# Patient Record
Sex: Female | Born: 1960 | Race: White | Hispanic: No | Smoking: Current every day smoker
Health system: Southern US, Community
[De-identification: ages and names within clinical notes are randomized; demographics above are authoritative.]

## PROBLEM LIST (undated history)

## (undated) DIAGNOSIS — R51 Headache: Secondary | ICD-10-CM

## (undated) DIAGNOSIS — T7840XA Allergy, unspecified, initial encounter: Secondary | ICD-10-CM

## (undated) DIAGNOSIS — J449 Chronic obstructive pulmonary disease, unspecified: Secondary | ICD-10-CM

## (undated) DIAGNOSIS — F32A Depression, unspecified: Secondary | ICD-10-CM

## (undated) DIAGNOSIS — R519 Headache, unspecified: Secondary | ICD-10-CM

## (undated) DIAGNOSIS — F329 Major depressive disorder, single episode, unspecified: Secondary | ICD-10-CM

## (undated) HISTORY — DX: Headache: R51

## (undated) HISTORY — DX: Chronic obstructive pulmonary disease, unspecified: J44.9

## (undated) HISTORY — DX: Allergy, unspecified, initial encounter: T78.40XA

## (undated) HISTORY — DX: Depression, unspecified: F32.A

## (undated) HISTORY — DX: Headache, unspecified: R51.9

## (undated) HISTORY — DX: Major depressive disorder, single episode, unspecified: F32.9

## (undated) HISTORY — PX: CARPAL TUNNEL RELEASE: SHX101

---

## 1998-12-19 ENCOUNTER — Other Ambulatory Visit: Admission: RE | Admit: 1998-12-19 | Discharge: 1998-12-19 | Payer: Self-pay | Admitting: Family Medicine

## 1999-05-01 ENCOUNTER — Other Ambulatory Visit: Admission: RE | Admit: 1999-05-01 | Discharge: 1999-05-01 | Payer: Self-pay | Admitting: Gynecology

## 2002-02-02 ENCOUNTER — Encounter: Payer: Self-pay | Admitting: Emergency Medicine

## 2002-02-02 ENCOUNTER — Emergency Department (HOSPITAL_COMMUNITY): Admission: EM | Admit: 2002-02-02 | Discharge: 2002-02-02 | Payer: Self-pay | Admitting: Emergency Medicine

## 2002-02-04 ENCOUNTER — Emergency Department (HOSPITAL_COMMUNITY): Admission: EM | Admit: 2002-02-04 | Discharge: 2002-02-04 | Payer: Self-pay | Admitting: Emergency Medicine

## 2009-12-12 ENCOUNTER — Ambulatory Visit: Payer: Self-pay | Admitting: Family Medicine

## 2009-12-12 DIAGNOSIS — J019 Acute sinusitis, unspecified: Secondary | ICD-10-CM | POA: Insufficient documentation

## 2009-12-12 HISTORY — DX: Acute sinusitis, unspecified: J01.90

## 2010-02-14 ENCOUNTER — Ambulatory Visit: Payer: Self-pay | Admitting: Family Medicine

## 2010-02-14 DIAGNOSIS — M25539 Pain in unspecified wrist: Secondary | ICD-10-CM | POA: Insufficient documentation

## 2010-02-14 DIAGNOSIS — R300 Dysuria: Secondary | ICD-10-CM

## 2010-02-14 HISTORY — DX: Pain in unspecified wrist: M25.539

## 2010-02-14 HISTORY — DX: Dysuria: R30.0

## 2010-02-14 LAB — CONVERTED CEMR LAB
Bilirubin Urine: NEGATIVE
Blood in Urine, dipstick: NEGATIVE
Glucose, Urine, Semiquant: NEGATIVE
Ketones, urine, test strip: NEGATIVE
Nitrite: NEGATIVE
Specific Gravity, Urine: 1.015
Urobilinogen, UA: 0.2
WBC Urine, dipstick: NEGATIVE
pH: 6.5

## 2010-02-16 ENCOUNTER — Telehealth (INDEPENDENT_AMBULATORY_CARE_PROVIDER_SITE_OTHER): Payer: Self-pay | Admitting: *Deleted

## 2010-03-27 ENCOUNTER — Encounter: Payer: Self-pay | Admitting: Family Medicine

## 2010-10-10 NOTE — Progress Notes (Signed)
Summary: Lab Results   Phone Note Outgoing Call   Call placed by: Army Fossa CMA,  February 16, 2010 3:39 PM Reason for Call: Discuss lab or test results Summary of Call: Regarding lab results, LMTCB:  contaminated--- if symptoms persist after cipro---recheck ua,  C&S Signed by Loreen Freud DO on 02/16/2010 at 9:24 AM  Follow-up for Phone Call        [facility]. Army Fossa CMA  February 17, 2010 10:31 AM   Additional Follow-up for Phone Call Additional follow up Details #1::        Pt is aware. Army Fossa CMA  February 20, 2010 10:46 AM

## 2010-10-10 NOTE — Consult Note (Signed)
Summary: [facility]  [facility]   Imported By: Lanelle Bal 04/07/2010 11:00:58  _____________________________________________________________________  External Attachment:    Type:   Image     Comment:   External Document

## 2010-10-10 NOTE — Assessment & Plan Note (Signed)
Summary: NEW "ACUTE ONLY" ? SINUS INFECTION,[facility] INS/RH......   Vital Signs:  Patient profile:   50 year old female Height:      64 inches Weight:      168 pounds BMI:     28.94 Temp:     98.8 degrees F oral Pulse rate:   83 / minute Pulse rhythm:   regular BP sitting:   122 / 80  (left arm) Cuff size:   regular  Vitals Entered By: Army Fossa CMA (December 12, 2009 2:32 PM) CC: Pt here for possible sinus infection, pressure in her face, sneezing., URI symptoms   History of Present Illness:       This is a 50 year old woman who presents with URI symptoms.  The symptoms began 1 week ago.  The patient complains of nasal congestion, purulent nasal discharge, sore throat, productive cough, earache, and sick contacts.  The patient denies fever, low-grade fever (<100.5 degrees), fever of 100.5-103 degrees, fever of 103.1-104 degrees, fever to >104 degrees, stiff neck, dyspnea, wheezing, rash, vomiting, diarrhea, use of an antipyretic, and response to antipyretic.  The patient also reports headache.  The patient denies itchy watery eyes, itchy throat, sneezing, seasonal symptoms, response to antihistamine, muscle aches, and severe fatigue.  The patient denies the following risk factors for Strep sinusitis: unilateral facial pain, unilateral nasal discharge, poor response to decongestant, double sickening, tooth pain, Strep exposure, tender adenopathy, and absence of cough.  + B/L sinus pressure.  No otc meds.     Preventive Screening-Counseling & Management  Alcohol-Tobacco     Smoking Status: current  Caffeine-Diet-Exercise     Does Patient Exercise: no      Drug Use:  no.    Current Medications (verified): 1)  Augmentin 875-125 Mg Tabs (Amoxicillin-Pot Clavulanate) .Marland Kitchen.. 1 By Mouth Two Times A Day 2)  Nasonex 50 Mcg/act Susp (Mometasone Furoate) .... 2 Sprays Each Nostril Once Daily  Allergies (verified): 1)  ! Ibuprofen  Past History:  Family History: Last updated:  12/12/2009 Family History Diabetes 1st degree relative Family History High cholesterol Family History Hypertension Lupus  Social History: Last updated: 12/12/2009 Married Current Smoker Alcohol use-yes Drug use-no Regular exercise-no  Risk Factors: Exercise: no (12/12/2009)  Risk Factors: Smoking Status: current (12/12/2009)  Past medical, surgical, family and social histories (including risk factors) reviewed for relevance to current acute and chronic problems.  Past Surgical History: Caesarean section  Family History: Reviewed history and no changes required. Family History Diabetes 1st degree relative Family History High cholesterol Family History Hypertension Lupus  Social History: Reviewed history and no changes required. Married Current Smoker Alcohol use-yes Drug use-no Regular exercise-no Smoking Status:  current Drug Use:  no Does Patient Exercise:  no  Review of Systems      See HPI  Physical Exam  General:  Well-developed,well-nourished,in no acute distress; alert,appropriate and cooperative throughout examination Nose:  L frontal sinus tenderness, L maxillary sinus tenderness, R frontal sinus tenderness, and R maxillary sinus tenderness.   Mouth:  Oral mucosa and oropharynx without lesions or exudates.  Teeth in good repair. Neck:  No deformities, masses, or tenderness noted. Lungs:  Normal respiratory effort, chest expands symmetrically. Lungs are clear to auscultation, no crackles or wheezes. Heart:  normal rate and no murmur.   Psych:  Oriented X3 and normally interactive.     Impression & Recommendations:  Problem # 1:  SINUSITIS - ACUTE-NOS (ICD-461.9)  Her updated medication list for this problem includes:  Augmentin 875-125 Mg Tabs (Amoxicillin-pot clavulanate) .Marland Kitchen... 1 by mouth two times a day    Nasonex 50 Mcg/act Susp (Mometasone furoate) .Marland Kitchen... 2 sprays each nostril once daily  Instructed on treatment. Call if symptoms persist or  worsen.   Complete Medication List: 1)  Augmentin 875-125 Mg Tabs (Amoxicillin-pot clavulanate) .Marland Kitchen.. 1 by mouth two times a day 2)  Nasonex 50 Mcg/act Susp (Mometasone furoate) .... 2 sprays each nostril once daily Prescriptions: AUGMENTIN 875-125 MG TABS (AMOXICILLIN-POT CLAVULANATE) 1 by mouth two times a day  #20 x 0   Entered and Authorized by:   Loreen Freud DO   Signed by:   Loreen Freud DO on 12/12/2009   Method used:   Electronically to        [facility] (retail)       [address]       [place], [place]  [address]       Ph: [phone]       Fax: [phone]   RxID:   [id number]

## 2010-10-10 NOTE — Assessment & Plan Note (Signed)
Summary: carpal tunnel??//lch   Vital Signs:  Patient profile:   50 year old female Weight:      168 pounds Pulse rate:   70 / minute Pulse rhythm:   regular BP sitting:   126 / 82  (left arm) Cuff size:   regular  Vitals Entered By: Army Fossa CMA (February 14, 2010 10:51 AM) CC: Pt here c/o possibe carpal tunnel in her hands, c/o lower back also., Dysuria   History of Present Illness: Pt here c/o b/l wrist pain ---for years but recently worse--she has pain and numbness in both hands to elbow--it bothers her all day and night.     Dysuria      This is a 50 year old woman who presents with Dysuria.  The symptoms began 2 days ago.  The patient complains of burning with urination and urinary frequency, but denies urgency, hematuria, vaginal discharge, vaginal itching, and vaginal sores.  Associated symptoms include back pain.  The patient denies the following associated symptoms: nausea, vomiting, fever, shaking chills, flank pain, abdominal pain, pelvic pain, and arthralgias.  The patient denies the following risk factors: diabetes, prior antibiotics, immunosuppression, history of GU anomaly, history of pyelonephritis, pregnancy, history of STD, and analgesic abuse.    Current Medications (verified): 1)  Cipro 500 Mg Tabs (Ciprofloxacin Hcl) .Marland Kitchen.. 1 By Mouth Two Times A Day  Allergies: 1)  ! Ibuprofen  Past History:  Past medical, surgical, family and social histories (including risk factors) reviewed for relevance to current acute and chronic problems.  Past Surgical History: Reviewed history from 12/12/2009 and no changes required. Caesarean section  Family History: Reviewed history from 12/12/2009 and no changes required. Family History Diabetes 1st degree relative Family History High cholesterol Family History Hypertension Lupus  Social History: Reviewed history from 12/12/2009 and no changes required. Married Current Smoker Alcohol use-yes Drug use-no Regular  exercise-no  Review of Systems      See HPI  Physical Exam  General:  Well-developed,well-nourished,in no acute distress; alert,appropriate and cooperative throughout examination Abdomen:  Bowel sounds positive,abdomen soft and non-tender without masses, organomegaly or hernias noted. Pulses:  L radial normal.   Neurologic:  + numbness an tinling b/l wrists and hands with flexion of wrists and at rest--no relief Skin:  Intact without suspicious lesions or rashes Psych:  Oriented X3 and normally interactive.     Impression & Recommendations:  Problem # 1:  WRIST PAIN, BILATERAL (ICD-719.43)  Orders: Ankle / Wrist Splint (A4570) Ankle / Wrist Splint (A4570) UA Dipstick w/o Micro (manual) (40981) Orthopedic Surgeon Referral (Ortho Surgeon)  Problem # 2:  DYSURIA (ICD-788.1)  Orders: T-Culture, Urine (19147-82956) UA Dipstick w/o Micro (manual) (21308)  Encouraged to push clear liquids, get enough rest, and take acetaminophen as needed. To be seen in 10 days if no improvement, sooner if worse.  Her updated medication list for this problem includes:    Cipro 500 Mg Tabs (Ciprofloxacin hcl) .Marland Kitchen... 1 by mouth two times a day  Complete Medication List: 1)  Cipro 500 Mg Tabs (Ciprofloxacin hcl) .Marland Kitchen.. 1 by mouth two times a day Prescriptions: CIPRO 500 MG TABS (CIPROFLOXACIN HCL) 1 by mouth two times a day  #10 x 0   Entered and Authorized by:   Loreen Freud DO   Signed by:   Loreen Freud DO on 02/14/2010   Method used:   Electronically to        [facility] (retail)  [address]       [place], [place]  [address]       Ph: [phone]       Fax: [phone]   RxID:   [id number]   Laboratory Results   Urine Tests    Routine Urinalysis   Color: yellow Appearance: Clear Glucose: negative   (Normal Range: Negative) Bilirubin: negative   (Normal Range: Negative) Ketone: negative   (Normal Range: Negative) Spec. Gravity: 1.015    (Normal Range: 1.003-1.035) Blood: negative   (Normal Range: Negative) pH: 6.5   (Normal Range: 5.0-8.0) Protein: trace   (Normal Range: Negative) Urobilinogen: 0.2   (Normal Range: 0-1) Nitrite: negative   (Normal Range: Negative) Leukocyte Esterace: negative   (Normal Range: Negative)    Comments: Army Fossa CMA  February 14, 2010 10:56 AM

## 2012-09-19 ENCOUNTER — Ambulatory Visit: Payer: Self-pay | Admitting: Family Medicine

## 2013-07-04 ENCOUNTER — Encounter (HOSPITAL_COMMUNITY): Payer: Self-pay | Admitting: Emergency Medicine

## 2013-07-04 ENCOUNTER — Emergency Department (HOSPITAL_COMMUNITY)
Admission: EM | Admit: 2013-07-04 | Discharge: 2013-07-04 | Disposition: A | Discharge status: Home / Self Care | Payer: Worker's Compensation | Attending: Emergency Medicine | Admitting: Emergency Medicine

## 2013-07-04 DIAGNOSIS — F17200 Nicotine dependence, unspecified, uncomplicated: Secondary | ICD-10-CM | POA: Insufficient documentation

## 2013-07-04 DIAGNOSIS — G8911 Acute pain due to trauma: Secondary | ICD-10-CM | POA: Insufficient documentation

## 2013-07-04 DIAGNOSIS — M62838 Other muscle spasm: Secondary | ICD-10-CM | POA: Insufficient documentation

## 2013-07-04 MED ORDER — HYDROMORPHONE HCL PF 1 MG/ML IJ SOLN
1.0000 mg | Freq: Once | INTRAMUSCULAR | Status: AC
  Administered 2013-07-04: 1 mg via INTRAMUSCULAR
  Filled 2013-07-04: qty 1

## 2013-07-04 MED ORDER — DIAZEPAM 5 MG/ML IJ SOLN
5.0000 mg | Freq: Once | INTRAMUSCULAR | Status: AC
  Administered 2013-07-04: 5 mg via INTRAMUSCULAR
  Filled 2013-07-04: qty 2

## 2013-07-04 MED ORDER — OXYCODONE-ACETAMINOPHEN 5-325 MG PO TABS
2.0000 | ORAL_TABLET | Freq: Once | ORAL | Status: AC
  Administered 2013-07-04: 2 via ORAL
  Filled 2013-07-04: qty 2

## 2013-07-04 MED ORDER — DIAZEPAM 5 MG PO TABS: 5.0000 mg | ORAL_TABLET | Freq: Three times a day (TID) | ORAL | Status: DC | PRN

## 2013-07-04 NOTE — ED Provider Notes (Signed)
CSN: [id number]     Arrival date & time 07/04/13  0141 History   First MD Initiated Contact with Patient 07/04/13 0210     Chief Complaint  Patient presents with  . Neck Pain   (Consider location/radiation/quality/duration/timing/severity/associated sxs/prior Treatment) HPI Comments: States that on Wednesday.  The machine, she was working on continually, jammed it.  She had 2 use her right arm to pull forcefully material out of the gym.  Several times.  She is she felt a little pain in the side of her neck.  At that time, but when she woke Thursday morning.  She was stiff, and having trouble moving her right shoulder, and turning her head.  She was seen by the occupational health doctor, who diagnosed her with a muscle strain and prescribed Flexeril.  She took this for one day without any help hold him and he, then prescribed, Percocet, which she has been taking, as well as the Flexeril, with no relief.  She is also been applying ice for 20 minutes every 4 hours  Patient is a 52 y.o. female presenting with neck pain. The history is provided by the patient.  Neck Pain Pain location:  R side Quality:  Aching and stiffness Pain radiates to:  R scapula Pain severity:  Severe Pain is:  Same all the time Onset quality:  Gradual Duration:  3 days Timing:  Constant Progression:  Worsening Chronicity:  New Context: recent injury   Relieved by:  Nothing Worsened by:  Position Ineffective treatments:  Analgesics, ice and muscle relaxants Associated symptoms: no fever, no headaches, no numbness, no tingling and no weakness   Risk factors: no hx of osteoporosis, no hx of spinal trauma and no recent epidural     History reviewed. No pertinent past medical history. Past Surgical History  Procedure Laterality Date  . Carpal tunnel release     No family history on file. History  Substance Use Topics  . Smoking status: Current Every Day Smoker -- 1.00 packs/day    Types: Cigarettes  .  Smokeless tobacco: Not on file  . Alcohol Use: Yes     Comment: socially   OB History   Grav Para Term Preterm Abortions TAB SAB Ect Mult Living                 Review of Systems  Constitutional: Negative for fever.  Musculoskeletal: Positive for neck pain.  Neurological: Negative for dizziness, tingling, weakness, numbness and headaches.    Allergies  Ibuprofen  Home Medications   Current Outpatient Rx  Name  Route  Sig  Dispense  Refill  . cyclobenzaprine (FLEXERIL) 5 MG tablet   Oral   Take 5-10 mg by mouth at bedtime.         Marland Kitchen oxyCODONE-acetaminophen (PERCOCET/ROXICET) 5-325 MG per tablet   Oral   Take 1 tablet by mouth every 8 (eight) hours as needed for pain (neck pain).         . diazepam (VALIUM) 5 MG tablet   Oral   Take 1 tablet (5 mg total) by mouth every 8 (eight) hours as needed for anxiety.   15 tablet   0    BP 145/102  Pulse 102  Temp(Src) 98.5 F (36.9 C) (Oral)  Resp 18  SpO2 98% Physical Exam  Constitutional: She appears well-developed and well-nourished.  HENT:  Head: Normocephalic.  Mouth/Throat: Oropharynx is clear and moist.  Eyes: Pupils are equal, round, and reactive to light.  Neck: Full passive  range of motion without pain. Muscular tenderness present. No spinous process tenderness present. Decreased range of motion present.    Lymphadenopathy:    She has no cervical adenopathy.    ED Course  Procedures (including critical care time) Labs Review Labs Reviewed - No data to display Imaging Review No results found.  EKG Interpretation   None       MDM   1. Spasm of cervical paraspinous muscle    Patient has gained significant relief of her discomfort.  She will be discharged home with by mouth Valium.  She is continue to take the Percocet as needed and warm compresses.  She has been given exercises to perform and taken out of work for 2 days     Arman Filter, NP 07/04/13 [phone]

## 2013-07-04 NOTE — ED Provider Notes (Signed)
Medical screening examination/treatment/procedure(s) were performed by non-physician practitioner and as supervising physician I was immediately available for consultation/collaboration.    Olivia Mackie, MD 07/04/13 [phone]

## 2013-07-04 NOTE — ED Notes (Signed)
Pt states that she injured her neck at work sometime on Wed; pt was seen on Thurs by the worker's comp MD; pt states that she was diagnosed with a neck strain; pt states that she was started on Flexeril; pt states that she has gotten no relief from the Flexeril and called the MD back who then prescribed Oxycodone; pt states that that medication isn't helping either and she feels unable to move her neck due to the pain

## 2013-07-04 NOTE — ED Notes (Signed)
Pt states she feels a lot better but she still has pain in top of her head

## 2013-08-01 ENCOUNTER — Ambulatory Visit: Payer: Self-pay | Admitting: Family Medicine

## 2014-08-20 ENCOUNTER — Encounter: Payer: Self-pay | Admitting: Oncology

## 2014-08-20 NOTE — Progress Notes (Signed)
[facility] [phone]

## 2018-08-19 ENCOUNTER — Emergency Department (HOSPITAL_COMMUNITY): Payer: 59

## 2018-08-19 ENCOUNTER — Encounter (HOSPITAL_COMMUNITY): Payer: Self-pay | Admitting: Emergency Medicine

## 2018-08-19 ENCOUNTER — Observation Stay (HOSPITAL_COMMUNITY)
Admission: EM | Admit: 2018-08-19 | Discharge: 2018-08-21 | Disposition: A | Discharge status: Home / Self Care | Payer: 59 | Attending: Internal Medicine | Admitting: Internal Medicine

## 2018-08-19 ENCOUNTER — Other Ambulatory Visit: Payer: Self-pay

## 2018-08-19 DIAGNOSIS — J9601 Acute respiratory failure with hypoxia: Secondary | ICD-10-CM | POA: Diagnosis not present

## 2018-08-19 DIAGNOSIS — Z720 Tobacco use: Secondary | ICD-10-CM | POA: Diagnosis present

## 2018-08-19 DIAGNOSIS — J441 Chronic obstructive pulmonary disease with (acute) exacerbation: Secondary | ICD-10-CM

## 2018-08-19 DIAGNOSIS — J209 Acute bronchitis, unspecified: Secondary | ICD-10-CM | POA: Diagnosis not present

## 2018-08-19 DIAGNOSIS — R0602 Shortness of breath: Secondary | ICD-10-CM | POA: Diagnosis present

## 2018-08-19 DIAGNOSIS — T7840XA Allergy, unspecified, initial encounter: Secondary | ICD-10-CM | POA: Diagnosis not present

## 2018-08-19 DIAGNOSIS — E876 Hypokalemia: Secondary | ICD-10-CM | POA: Diagnosis not present

## 2018-08-19 DIAGNOSIS — J449 Chronic obstructive pulmonary disease, unspecified: Secondary | ICD-10-CM | POA: Diagnosis present

## 2018-08-19 DIAGNOSIS — Z79899 Other long term (current) drug therapy: Secondary | ICD-10-CM | POA: Diagnosis not present

## 2018-08-19 DIAGNOSIS — F17210 Nicotine dependence, cigarettes, uncomplicated: Secondary | ICD-10-CM | POA: Diagnosis not present

## 2018-08-19 DIAGNOSIS — Z789 Other specified health status: Secondary | ICD-10-CM

## 2018-08-19 DIAGNOSIS — F1090 Alcohol use, unspecified, uncomplicated: Secondary | ICD-10-CM

## 2018-08-19 DIAGNOSIS — F1099 Alcohol use, unspecified with unspecified alcohol-induced disorder: Secondary | ICD-10-CM | POA: Diagnosis not present

## 2018-08-19 DIAGNOSIS — Z7289 Other problems related to lifestyle: Secondary | ICD-10-CM

## 2018-08-19 HISTORY — DX: Hypokalemia: E87.6

## 2018-08-19 HISTORY — DX: Acute bronchitis, unspecified: J20.9

## 2018-08-19 HISTORY — DX: Tobacco use: Z72.0

## 2018-08-19 LAB — INFLUENZA PANEL BY PCR (TYPE A & B)
Influenza A By PCR: NEGATIVE
Influenza B By PCR: NEGATIVE

## 2018-08-19 LAB — COMPREHENSIVE METABOLIC PANEL
ALBUMIN: 3.8 g/dL (ref 3.5–5.0)
ALT: 12 U/L (ref 0–44)
AST: 14 U/L — ABNORMAL LOW (ref 15–41)
Alkaline Phosphatase: 56 U/L (ref 38–126)
Anion gap: 13 (ref 5–15)
BILIRUBIN TOTAL: 0.3 mg/dL (ref 0.3–1.2)
BUN: 9 mg/dL (ref 6–20)
CO2: 23 mmol/L (ref 22–32)
Calcium: 9.4 mg/dL (ref 8.9–10.3)
Chloride: 103 mmol/L (ref 98–111)
Creatinine, Ser: 0.7 mg/dL (ref 0.44–1.00)
GFR calc Af Amer: >60 mL/min (ref >60)
Glucose, Bld: 117 mg/dL — ABNORMAL HIGH (ref 70–99)
Potassium: 3.1 mmol/L — ABNORMAL LOW (ref 3.5–5.1)
Sodium: 139 mmol/L (ref 135–145)
Total Protein: 6.4 g/dL — ABNORMAL LOW (ref 6.5–8.1)

## 2018-08-19 LAB — BLOOD GAS, VENOUS
Acid-base deficit: 0.8 mmol/L (ref 0.0–2.0)
Bicarbonate: 24.8 mmol/L (ref 20.0–28.0)
O2 Saturation: 77.6 %
Patient temperature: 98.6
pCO2, Ven: 46 mmHg (ref 44.0–60.0)
pH, Ven: 7.352 (ref 7.250–7.430)
pO2, Ven: 44.3 mmHg (ref 32.0–45.0)

## 2018-08-19 LAB — CBC WITH DIFFERENTIAL/PLATELET
Abs Immature Granulocytes: 0.05 10*3/uL (ref 0.00–0.07)
Basophils Absolute: 0 10*3/uL (ref 0.0–0.1)
Basophils Relative: 0 %
EOS ABS: 0 10*3/uL (ref 0.0–0.5)
Eosinophils Relative: 0 %
HCT: 49.7 % — ABNORMAL HIGH (ref 36.0–46.0)
Hemoglobin: 16.2 g/dL — ABNORMAL HIGH (ref 12.0–15.0)
Immature Granulocytes: 0 %
Lymphocytes Relative: 9 %
Lymphs Abs: 1 10*3/uL (ref 0.7–4.0)
MCH: 34 pg (ref 26.0–34.0)
MCHC: 32.6 g/dL (ref 30.0–36.0)
MCV: 104.4 fL — ABNORMAL HIGH (ref 80.0–100.0)
Monocytes Absolute: 0.3 10*3/uL (ref 0.1–1.0)
Monocytes Relative: 2 %
NEUTROS PCT: 89 %
Neutro Abs: 10.2 10*3/uL — ABNORMAL HIGH (ref 1.7–7.7)
Platelets: 252 10*3/uL (ref 150–400)
RBC: 4.76 MIL/uL (ref 3.87–5.11)
RDW: 12.5 % (ref 11.5–15.5)
WBC: 11.5 10*3/uL — ABNORMAL HIGH (ref 4.0–10.5)
nRBC: 0 % (ref 0.0–0.2)

## 2018-08-19 LAB — I-STAT TROPONIN, ED: TROPONIN I, POC: 0.02 ng/mL (ref 0.00–0.08)

## 2018-08-19 MED ORDER — METHYLPREDNISOLONE SODIUM SUCC 125 MG IJ SOLR
125.0000 mg | Freq: Once | INTRAMUSCULAR | Status: AC
  Administered 2018-08-19: 125 mg via INTRAVENOUS
  Filled 2018-08-19: qty 2

## 2018-08-19 MED ORDER — IOPAMIDOL (ISOVUE-370) INJECTION 76%
100.0000 mL | Freq: Once | INTRAVENOUS | Status: AC | PRN
  Administered 2018-08-19: 100 mL via INTRAVENOUS
  Administered 2018-08-20: 06:00:00 via INTRAVENOUS

## 2018-08-19 MED ORDER — SODIUM CHLORIDE (PF) 0.9 % IJ SOLN
INTRAMUSCULAR | Status: AC
  Administered 2018-08-20: 07:00:00
  Filled 2018-08-19: qty 50

## 2018-08-19 MED ORDER — DIPHENHYDRAMINE HCL 50 MG/ML IJ SOLN
50.0000 mg | Freq: Once | INTRAMUSCULAR | Status: AC
  Administered 2018-08-19: 50 mg via INTRAVENOUS
  Filled 2018-08-19: qty 1

## 2018-08-19 MED ORDER — ALBUTEROL SULFATE (2.5 MG/3ML) 0.083% IN NEBU
5.0000 mg | INHALATION_SOLUTION | Freq: Once | RESPIRATORY_TRACT | Status: AC
  Administered 2018-08-20: 5 mg via RESPIRATORY_TRACT
  Filled 2018-08-19: qty 6

## 2018-08-19 MED ORDER — ALBUTEROL SULFATE (2.5 MG/3ML) 0.083% IN NEBU
5.0000 mg | INHALATION_SOLUTION | Freq: Once | RESPIRATORY_TRACT | Status: AC
  Administered 2018-08-19: 5 mg via RESPIRATORY_TRACT
  Filled 2018-08-19: qty 6

## 2018-08-19 MED ORDER — IOPAMIDOL (ISOVUE-370) INJECTION 76%
INTRAVENOUS | Status: AC
  Filled 2018-08-19: qty 100

## 2018-08-19 MED ORDER — FAMOTIDINE IN NACL 20-0.9 MG/50ML-% IV SOLN
20.0000 mg | Freq: Once | INTRAVENOUS | Status: AC
  Administered 2018-08-19: 20 mg via INTRAVENOUS
  Filled 2018-08-19: qty 50

## 2018-08-19 MED ORDER — SODIUM CHLORIDE 0.9 % IV BOLUS
1000.0000 mL | Freq: Once | INTRAVENOUS | Status: AC
  Administered 2018-08-19: 1000 mL via INTRAVENOUS

## 2018-08-19 NOTE — ED Triage Notes (Signed)
Pt reports ongoing shortness of breath for the last month and has worsened tonight. Pt reported taking amoxicillin tonight and then started getting red and itching all over.

## 2018-08-19 NOTE — H&P (Signed)
History and Physical    CAPRI VEALS JXB:[id number] DOB: 11-21-1960 DOA: 08/19/2018  Referring MD/NP/PA:   PCP: Patient, No Pcp Per   Patient coming from:  The patient is coming from home.  At baseline, pt is independent for most of ADL.        Chief Complaint: Cough, shortness of breath  HPI: Erin Sloan is a 57 y.o. female with medical history significant of tobacco abuse, alcohol use, who presents with shortness breath and cough.  Patient states that she has been having cough and shortness of breath for more than 1 month.  She coughs up  yellow sputum. No chest pain, fever or chills.  Patient was given prescription of Augmentin and prednisone by PCP on 11/5. She reports taking 1 dose and developed allergic reaction type symptoms.  She quit taking both of these. She is not sure which medication caused problem.  She states that her symptoms have been worsening progressively.  She continues to have cough and shortness of breath. She took an Augmentin again today and developed allergic reaction, described as whole body itchy and red, no rash.  Denies lip or tongue swelling.  Patient does not have nausea, vomiting, diarrhea, abdominal pain, symptoms of UTI or unilateral weakness.  She continues to smoke.  She states that she drinks 4-6 beers every day.  Last drinking was yesterday.  ED Course: pt was found to have WBC 11.5, negative troponin, negative flu PCR, potassium 3.1, creatinine normal, temperature 97.4, tachycardia, tachypnea, oxygen saturation 88% on room air.  CT angiogram of chest is negative for PE, showed central emphysema and bronchitis.  Review of Systems:   General: no fevers, chills, no body weight gain, has fatigue HEENT: no blurry vision, hearing changes or sore throat Respiratory: has dyspnea, coughing, wheezing CV: no chest pain, no palpitations GI: no nausea, vomiting, abdominal pain, diarrhea, constipation GU: no dysuria, burning on urination, increased urinary  frequency, hematuria  Ext: no leg edema Neuro: no unilateral weakness, numbness, or tingling, no vision change or hearing loss Skin: no rash, no skin tear. MSK: No muscle spasm, no deformity, no limitation of range of movement in spin Heme: No easy bruising.  Travel history: No recent long distant travel.  Allergy:  Allergies  Allergen Reactions  . Ibuprofen Swelling  . Amoxicillin Hives    Has patient had a PCN reaction causing immediate rash, facial/tongue/throat swelling, SOB or lightheadedness with hypotension: No Has patient had a PCN reaction causing severe rash involving mucus membranes or skin necrosis: No Has patient had a PCN reaction that required hospitalization: No Has patient had a PCN reaction occurring within the last 10 years: No If all of the above answers are "NO", then may proceed with Cephalosporin use.\    History reviewed. No pertinent past medical history.  Past Surgical History:  Procedure Laterality Date  . CARPAL TUNNEL RELEASE      Social History:  reports that she has been smoking cigarettes. She has been smoking about 1.00 pack per day. She has never used smokeless tobacco. She reports that she drinks alcohol. She reports that she does not use drugs.  Family History:  Family History  Problem Relation Age of Onset  . Pancreatic cancer Mother   . Heart disease Father      Prior to Admission medications   Medication Sig Start Date End Date Taking? Authorizing Provider  calcium carbonate (TUMS - DOSED IN MG ELEMENTAL CALCIUM) 500 MG chewable tablet Chew 1 tablet by  mouth daily.   Yes [provider]  amoxicillin-clavulanate (AUGMENTIN) 875-125 MG tablet Take 1 tablet by mouth 2 (two) times daily.  07/15/18   [provider]  predniSONE (DELTASONE) 10 MG tablet Take 10-40 mg by mouth See admin instructions. Take 4 tabs daily x 3 days, 3 tabs daily x 3 days, 2 tabs daily x 3 days, 1 tab daily x 3 days 07/15/18   [provider]      Physical Exam: Vitals:   08/20/18 0400 08/20/18 0430 08/20/18 0500 08/20/18 0530  BP: (!) 94/56 112/67 113/75 125/71  Pulse: 80 82 86 86  Resp: (!) 30 16 20 16   Temp:      TempSrc:      SpO2: 93% 95% 100% 97%  Weight:      Height:       General: Not in acute distress HEENT:       Eyes: PERRL, EOMI, no scleral icterus.       ENT: No discharge from the ears and nose, no pharynx injection, no tonsillar enlargement.        Neck: No JVD, no bruit, no mass felt. Heme: No neck lymph node enlargement. Cardiac: S1/S2, RRR, No murmurs, No gallops or rubs. Respiratory: Coarse breathing sound, no wheezing or rhonchi GI: Soft, nondistended, nontender, no rebound pain, no organomegaly, BS present. GU: No hematuria Ext: No pitting leg edema bilaterally. 2+DP/PT pulse bilaterally. Musculoskeletal: No joint deformities, No joint redness or warmth, no limitation of ROM in spin. Skin: No rashes.  Neuro: Alert, oriented X3, cranial nerves II-XII grossly intact, moves all extremities normally. Psych: Patient is not psychotic, no suicidal or hemocidal ideation.  Labs on Admission: I have personally reviewed following labs and imaging studies  CBC: Recent Labs  Lab 08/19/18 2059  WBC 11.5*  NEUTROABS 10.2*  HGB 16.2*  HCT 49.7*  MCV 104.4*  PLT 252   Basic Metabolic Panel: Recent Labs  Lab 08/19/18 2059  NA 139  K 3.1*  CL 103  CO2 23  GLUCOSE 117*  BUN 9  CREATININE 0.70  CALCIUM 9.4   GFR: Estimated Creatinine Clearance: 60.7 mL/min (by C-G formula based on SCr of 0.7 mg/dL). Liver Function Tests: Recent Labs  Lab 08/19/18 2059  AST 14*  ALT 12  ALKPHOS 56  BILITOT 0.3  PROT 6.4*  ALBUMIN 3.8   No results for input(s): LIPASE, AMYLASE in the last 168 hours. No results for input(s): AMMONIA in the last 168 hours. Coagulation Profile: No results for input(s): INR, PROTIME in the last 168 hours. Cardiac Enzymes: No results for input(s): CKTOTAL, CKMB, CKMBINDEX,  TROPONINI in the last 168 hours. BNP (last 3 results) No results for input(s): PROBNP in the last 8760 hours. HbA1C: No results for input(s): HGBA1C in the last 72 hours. CBG: No results for input(s): GLUCAP in the last 168 hours. Lipid Profile: No results for input(s): CHOL, HDL, LDLCALC, TRIG, CHOLHDL, LDLDIRECT in the last 72 hours. Thyroid Function Tests: No results for input(s): TSH, T4TOTAL, FREET4, T3FREE, THYROIDAB in the last 72 hours. Anemia Panel: No results for input(s): VITAMINB12, FOLATE, FERRITIN, TIBC, IRON, RETICCTPCT in the last 72 hours. Urine analysis:    Component Value Date/Time   COLORURINE yellow 02/14/2010 1049   APPEARANCEUR Clear 02/14/2010 1049   LABSPEC 1.015 02/14/2010 1049   PHURINE 6.5 02/14/2010 1049   HGBUR negative 02/14/2010 1049   BILIRUBINUR negative 02/14/2010 1049   UROBILINOGEN 0.2 02/14/2010 1049   NITRITE negative 02/14/2010 1049  Sepsis Labs: @LABRCNTIP (procalcitonin:4,lacticidven:4) )No results found for this or any previous visit (from the past 240 hour(s)).   Radiological Exams on Admission: Ct Angio Chest Pe W And/or Wo Contrast  Result Date: 08/19/2018 CLINICAL DATA:  57 y/o  F; worsening shortness of breath. EXAM: CT ANGIOGRAPHY CHEST WITH CONTRAST TECHNIQUE: Multidetector CT imaging of the chest was performed using the standard protocol during bolus administration of intravenous contrast. Multiplanar CT image reconstructions and MIPs were obtained to evaluate the vascular anatomy. CONTRAST:  100mL ISOVUE-370 IOPAMIDOL (ISOVUE-370) INJECTION 76% COMPARISON:  08/19/2018 chest radiograph. FINDINGS: Cardiovascular: Satisfactory opacification of the pulmonary arteries to the segmental level. No evidence of pulmonary embolism. Normal heart size. No pericardial effusion. Moderate coronary artery calcific atherosclerosis. Mediastinum/Nodes: No enlarged mediastinal, hilar, or axillary lymph nodes. Thyroid gland, trachea, and esophagus  demonstrate no significant findings. Lungs/Pleura: Moderate centrilobular emphysema with upper lobe predominance. Diffuse peribronchial thickening. Few scattered 2-3 mm nodules at the periphery of the lungs. No consolidation, effusion, or pneumothorax. Upper Abdomen: No acute abnormality. Musculoskeletal: No chest wall abnormality. No acute or significant osseous findings. Mild multilevel spondylosis of the thoracic spine. Review of the MIP images confirms the above findings. IMPRESSION: 1. No evidence of pulmonary embolus. 2. Moderate centrilobular emphysema.  Emphysema (ICD10-J43.9). 3. Diffuse peribronchial thickening compatible with bronchitis which may be reactive, inflammatory, or infectious. No consolidation. 4. Few scattered 2-3 mm nodules at the periphery of the lungs. No follow-up needed if patient is low-risk (and has no known or suspected primary neoplasm). Non-contrast chest CT can be considered in 12 months if patient is high-risk. This recommendation follows the consensus statement: Guidelines for Management of Incidental Pulmonary Nodules Detected on CT Images: From the Fleischner Society 2017; Radiology 2017; 284:228-243. 5. Moderate coronary artery calcific atherosclerosis. Electronically Signed   By: Mitzi HansenLance  Furusawa-Stratton M.D.   On: 08/19/2018 23:02   Dg Chest Port 1 View  Result Date: 08/19/2018 CLINICAL DATA:  Dyspnea and rash EXAM: PORTABLE CHEST 1 VIEW COMPARISON:  None. FINDINGS: Cardiac shadows within normal limits. The lungs are hyperinflated. No focal infiltrate or sizable effusion is seen. No bony abnormality is noted. IMPRESSION: COPD without acute abnormality. Electronically Signed   By: Alcide CleverMark  Lukens M.D.   On: 08/19/2018 21:18     EKG: Independently reviewed.  Sinus rhythm, QTC 467, low voltage, anteroseptal infarction pattern  Assessment/Plan Principal Problem:   Acute respiratory failure with hypoxia (HCC) Active Problems:   Tobacco abuse   Acute bronchitis    Hypokalemia   Allergic reaction   Alcohol use   COPD exacerbation (HCC)   Acute respiratory failure with hypoxia (HCC): Likely due to acute bronchitis versus undiagnosed COPD exacerbation given history of smoking, and chest x-ray findings of COPD and CT angiogram findings of central emphysema. Flu pcr negative.  -will place on med-surg bed for obs -Nebulizers: scheduled Atrovent and prn Xopenex Nebs -Solu-Medrol 60 mg IV bid -Z pak -Mucinex for cough  -Incentive spirometry -Follow up blood culture x2, sputum culture -Nasal cannula oxygen as needed to maintain O2 saturation 92% or greater  Hypokalemia: K= 3.1 on admission. - Repleted - Check Mg level  Allergic reaction:  -prn Benadryl  Tobacco abuse and Alcohol use: -Did counseling about importance of quitting smoking -Nicotine patch -Did counseling about the importance of quitting drinking -CIWA protocol   DVT ppx:  SQ Lovenox Code Status: Full code Family Communication:   Yes, patient's daughter   at bed side Disposition Plan:  Anticipate discharge back to previous home  environment Consults called: None Admission status: Obs / tele    Date of Service 08/20/2018    Lorretta Harp [facility] Pager [phone]  If 7PM-7AM, please contact night-coverage [email or web address] Password [email or web address] 08/20/2018, 5:50 AM

## 2018-08-19 NOTE — ED Provider Notes (Signed)
[facility]-EMERGENCY DEPT Provider Note   CSN: [id number] Arrival date & time: 08/19/18  2002     History   Chief Complaint Chief Complaint  Patient presents with  . Shortness of Breath    HPI Erin Sloan is a 57 y.o. female.  HPI 57 year old female presents today complaining of increased dyspnea.  She has had increasing shortness of breath over the past 5 to 6 weeks with productive cough.  She was placed on Augmentin and prednisone by her primary care doctor on November 5.  She reports taking 1 dose and having allergic reaction type symptoms.  She quit taking both of these.  She thought it was the prednisone that caused her to have the symptoms.  Today she had worsening cough and dyspnea.  She took an Augmentin and had increased rash over her body.  She states that she feels dyspneic with yellow sputum production.  She has had her flu shot.  She is a smoker and continues to smoke.  Does not have any inhaled medications at home. History reviewed. No pertinent past medical history.  Patient Active Problem List   Diagnosis Date Noted  . WRIST PAIN, BILATERAL 02/14/2010  . DYSURIA 02/14/2010  . SINUSITIS - ACUTE-NOS 12/12/2009    Past Surgical History:  Procedure Laterality Date  . CARPAL TUNNEL RELEASE       OB History   None      Home Medications    Prior to Admission medications   Medication Sig Start Date End Date Taking? Authorizing Provider  calcium carbonate (TUMS - DOSED IN MG ELEMENTAL CALCIUM) 500 MG chewable tablet Chew 1 tablet by mouth daily.   Yes [provider]  amoxicillin-clavulanate (AUGMENTIN) 875-125 MG tablet Take 1 tablet by mouth 2 (two) times daily.  07/15/18   [provider]  predniSONE (DELTASONE) 10 MG tablet Take 10-40 mg by mouth See admin instructions. Take 4 tabs daily x 3 days, 3 tabs daily x 3 days, 2 tabs daily x 3 days, 1 tab daily x 3 days 07/15/18   [provider]    Family  History History reviewed. No pertinent family history.  Social History Social History   Tobacco Use  . Smoking status: Current Every Day Smoker    Packs/day: 1.00    Types: Cigarettes  . Smokeless tobacco: Never Used  Substance Use Topics  . Alcohol use: Yes    Comment: socially  . Drug use: No     Allergies   Ibuprofen and Amoxicillin   Review of Systems Review of Systems  Constitutional: Positive for activity change and appetite change.  HENT: Positive for congestion.   Eyes: Negative.   Respiratory: Positive for choking.   Cardiovascular: Negative.  Negative for palpitations and leg swelling.  Gastrointestinal: Negative.   Endocrine: Negative.   Genitourinary: Negative.   Musculoskeletal: Negative.   Skin: Positive for rash.  Allergic/Immunologic: Negative.   Neurological: Negative.   Hematological: Negative.   Psychiatric/Behavioral: Negative.   All other systems reviewed and are negative.    Physical Exam Updated Vital Signs BP 130/88   Pulse (!) 104   Temp (!) 97.4 F (36.3 C) (Rectal)   Resp (!) 22   Ht 1.626 m (5\' 4" )   Wt 50.8 kg   SpO2 98%   BMI 19.22 kg/m   Physical Exam  Constitutional: She is oriented to person, place, and time. She appears well-developed and well-nourished.  Sats 88% time of initial evaluation  HENT:  Head: Normocephalic and atraumatic.  Mouth/Throat: Oropharynx is clear and moist.  Eyes: Pupils are equal, round, and reactive to light. EOM are normal.  Neck: Normal range of motion. Neck supple.  Cardiovascular: Normal rate, regular rhythm, normal heart sounds and intact distal pulses.  Pulmonary/Chest: Accessory muscle usage present. She is in respiratory distress. She has decreased breath sounds. She has no wheezes. She has no rales.  Abdominal: Soft. Bowel sounds are normal.  Musculoskeletal: Normal range of motion.       Right lower leg: Normal. She exhibits no edema.       Left lower leg: Normal. She exhibits no  edema.  Neurological: She is alert and oriented to person, place, and time.  Skin: Skin is warm and dry. Capillary refill takes less than 2 seconds. Rash noted.  Psychiatric: She has a normal mood and affect.  Nursing note and vitals reviewed.    ED Treatments / Results  Labs (all labs ordered are listed, but only abnormal results are displayed) Labs Reviewed  CBC WITH DIFFERENTIAL/PLATELET - Abnormal; Notable for the following components:      Result Value   WBC 11.5 (*)    Hemoglobin 16.2 (*)    HCT 49.7 (*)    MCV 104.4 (*)    Neutro Abs 10.2 (*)    All other components within normal limits  COMPREHENSIVE METABOLIC PANEL - Abnormal; Notable for the following components:   Potassium 3.1 (*)    Glucose, Bld 117 (*)    Total Protein 6.4 (*)    AST 14 (*)    All other components within normal limits  CULTURE, BLOOD (ROUTINE X 2)  CULTURE, BLOOD (ROUTINE X 2)  BLOOD GAS, VENOUS  I-STAT TROPONIN, ED    EKG EKG Interpretation  Date/Time:  Tuesday August 19 2018 20:53:12 EST Ventricular Rate:  115 PR Interval:    QRS Duration: 91 QT Interval:  337 QTC Calculation: 467 R Axis:   82 Text Interpretation:  Sinus tachycardia Right atrial enlargement Confirmed by Margarita Grizzle [phone]) on 08/19/2018 11:33:33 PM   Radiology Dg Chest Port 1 View  Result Date: 08/19/2018 CLINICAL DATA:  Dyspnea and rash EXAM: PORTABLE CHEST 1 VIEW COMPARISON:  None. FINDINGS: Cardiac shadows within normal limits. The lungs are hyperinflated. No focal infiltrate or sizable effusion is seen. No bony abnormality is noted. IMPRESSION: COPD without acute abnormality. Electronically Signed   By: Alcide Clever M.D.   On: 08/19/2018 21:18    Procedures .Critical Care Performed by: Margarita Grizzle, MD Authorized by: Margarita Grizzle, MD   Critical care provider statement:    Critical care time (minutes):  45   Critical care end time:  08/19/2018 11:34 PM   Critical care was necessary to treat or  prevent imminent or life-threatening deterioration of the following conditions:  Respiratory failure   Critical care was time spent personally by me on the following activities:  Discussions with consultants, evaluation of patient's response to treatment, examination of patient, ordering and performing treatments and interventions, ordering and review of laboratory studies, ordering and review of radiographic studies, pulse oximetry, re-evaluation of patient's condition, obtaining history from patient or surrogate and review of old charts   (including critical care time)  Medications Ordered in ED Medications  sodium chloride (PF) 0.9 % injection (has no administration in time range)  iopamidol (ISOVUE-370) 76 % injection (has no administration in time range)  albuterol (PROVENTIL) (2.5 MG/3ML) 0.083% nebulizer solution 5 mg (5 mg Nebulization Given 08/19/18 2106)  methylPREDNISolone sodium succinate (SOLU-MEDROL) 125 mg/2 mL injection 125 mg (125 mg Intravenous Given 08/19/18 2131)  diphenhydrAMINE (BENADRYL) injection 50 mg (50 mg Intravenous Given 08/19/18 2131)  sodium chloride 0.9 % bolus 1,000 mL (1,000 mLs Intravenous New Bag/Given 08/19/18 2128)  famotidine (PEPCID) IVPB 20 mg premix (20 mg Intravenous New Bag/Given 08/19/18 2138)  iopamidol (ISOVUE-370) 76 % injection 100 mL (100 mLs Intravenous Contrast Given 08/19/18 2223)     Initial Impression / Assessment and Plan / ED Course  I have reviewed the triage vital signs and the nursing notes.  Pertinent labs & imaging results that were available during my care of the patient were reviewed by me and considered in my medical decision making (see chart for details).     1 dyspnea appears to be COPD exacerbation no evidence of acute trait noted on chest x-Crytal Pensinger. Given patient's tachycardia, will obtain CT angios 2- allergic reaction patient took Augmentin and has increase of symptoms with rash with taking Augmentin tonight.  Discussed  with Dr. Clyde Lundborg and will see for admission Final Clinical Impressions(s) / ED Diagnoses   Final diagnoses:  COPD exacerbation (HCC)  Acute allergic reaction, initial encounter    ED Discharge Orders    None       Margarita Grizzle, MD 08/19/18 2351

## 2018-08-19 NOTE — ED Notes (Signed)
Patient ambulated to the bathroom with minimal assistance.  

## 2018-08-20 ENCOUNTER — Encounter (HOSPITAL_COMMUNITY): Payer: Self-pay | Admitting: Internal Medicine

## 2018-08-20 DIAGNOSIS — F1090 Alcohol use, unspecified, uncomplicated: Secondary | ICD-10-CM

## 2018-08-20 DIAGNOSIS — J9601 Acute respiratory failure with hypoxia: Principal | ICD-10-CM

## 2018-08-20 DIAGNOSIS — J441 Chronic obstructive pulmonary disease with (acute) exacerbation: Secondary | ICD-10-CM

## 2018-08-20 DIAGNOSIS — T7840XA Allergy, unspecified, initial encounter: Secondary | ICD-10-CM

## 2018-08-20 DIAGNOSIS — Z7289 Other problems related to lifestyle: Secondary | ICD-10-CM

## 2018-08-20 DIAGNOSIS — Z789 Other specified health status: Secondary | ICD-10-CM

## 2018-08-20 DIAGNOSIS — J449 Chronic obstructive pulmonary disease, unspecified: Secondary | ICD-10-CM | POA: Diagnosis present

## 2018-08-20 HISTORY — DX: Alcohol use, unspecified, uncomplicated: F10.90

## 2018-08-20 HISTORY — DX: Allergy, unspecified, initial encounter: T78.40XA

## 2018-08-20 LAB — MAGNESIUM: Magnesium: 2.1 mg/dL (ref 1.7–2.4)

## 2018-08-20 MED ORDER — ADULT MULTIVITAMIN W/MINERALS CH
1.0000 | ORAL_TABLET | Freq: Every day | ORAL | Status: DC
  Administered 2018-08-20 – 2018-08-21 (×2): 1 via ORAL
  Filled 2018-08-20 (×2): qty 1

## 2018-08-20 MED ORDER — AZITHROMYCIN 250 MG PO TABS
500.0000 mg | ORAL_TABLET | Freq: Every day | ORAL | Status: AC
  Administered 2018-08-20: 500 mg via ORAL
  Filled 2018-08-20: qty 2

## 2018-08-20 MED ORDER — POTASSIUM CHLORIDE 20 MEQ/15ML (10%) PO SOLN
40.0000 meq | Freq: Once | ORAL | Status: AC
  Administered 2018-08-20: 40 meq via ORAL
  Filled 2018-08-20: qty 30

## 2018-08-20 MED ORDER — ACETAMINOPHEN 325 MG PO TABS
650.0000 mg | ORAL_TABLET | Freq: Four times a day (QID) | ORAL | Status: DC | PRN
  Administered 2018-08-20: 650 mg via ORAL
  Filled 2018-08-20: qty 2

## 2018-08-20 MED ORDER — LEVALBUTEROL HCL 1.25 MG/0.5ML IN NEBU
1.2500 mg | INHALATION_SOLUTION | Freq: Four times a day (QID) | RESPIRATORY_TRACT | Status: DC
  Administered 2018-08-20: 1.25 mg via RESPIRATORY_TRACT
  Filled 2018-08-20: qty 0.5

## 2018-08-20 MED ORDER — DIPHENHYDRAMINE HCL 50 MG/ML IJ SOLN: 12.5000 mg | Freq: Three times a day (TID) | INTRAMUSCULAR | Status: DC | PRN

## 2018-08-20 MED ORDER — AZITHROMYCIN 250 MG PO TABS
250.0000 mg | ORAL_TABLET | Freq: Every day | ORAL | Status: DC
  Administered 2018-08-21: 250 mg via ORAL
  Filled 2018-08-20: qty 1

## 2018-08-20 MED ORDER — LEVALBUTEROL HCL 1.25 MG/0.5ML IN NEBU
1.2500 mg | INHALATION_SOLUTION | Freq: Four times a day (QID) | RESPIRATORY_TRACT | Status: DC
  Administered 2018-08-21: 1.25 mg via RESPIRATORY_TRACT
  Filled 2018-08-20: qty 0.5

## 2018-08-20 MED ORDER — LORAZEPAM 2 MG/ML IJ SOLN
0.0000 mg | Freq: Four times a day (QID) | INTRAMUSCULAR | Status: DC
  Administered 2018-08-20: 0 mg via INTRAVENOUS

## 2018-08-20 MED ORDER — THIAMINE HCL 100 MG/ML IJ SOLN: 100.0000 mg | Freq: Every day | INTRAMUSCULAR | Status: DC

## 2018-08-20 MED ORDER — NICOTINE 21 MG/24HR TD PT24
21.0000 mg | MEDICATED_PATCH | Freq: Every day | TRANSDERMAL | Status: DC
  Administered 2018-08-20 – 2018-08-21 (×2): 21 mg via TRANSDERMAL
  Filled 2018-08-20 (×2): qty 1

## 2018-08-20 MED ORDER — IPRATROPIUM BROMIDE 0.02 % IN SOLN
0.5000 mg | Freq: Four times a day (QID) | RESPIRATORY_TRACT | Status: DC
  Administered 2018-08-20 (×3): 0.5 mg via RESPIRATORY_TRACT
  Filled 2018-08-20 (×3): qty 2.5

## 2018-08-20 MED ORDER — BUTALBITAL-APAP-CAFFEINE 50-325-40 MG PO TABS
2.0000 | ORAL_TABLET | Freq: Once | ORAL | Status: AC
  Administered 2018-08-20: 2 via ORAL
  Filled 2018-08-20: qty 2

## 2018-08-20 MED ORDER — METHYLPREDNISOLONE SODIUM SUCC 125 MG IJ SOLR
60.0000 mg | Freq: Two times a day (BID) | INTRAMUSCULAR | Status: DC
  Administered 2018-08-20 – 2018-08-21 (×3): 60 mg via INTRAVENOUS
  Filled 2018-08-20 (×3): qty 2

## 2018-08-20 MED ORDER — FOLIC ACID 1 MG PO TABS
1.0000 mg | ORAL_TABLET | Freq: Every day | ORAL | Status: DC
  Administered 2018-08-20 – 2018-08-21 (×2): 1 mg via ORAL
  Filled 2018-08-20 (×2): qty 1

## 2018-08-20 MED ORDER — DM-GUAIFENESIN ER 30-600 MG PO TB12
1.0000 | ORAL_TABLET | Freq: Two times a day (BID) | ORAL | Status: DC
  Administered 2018-08-20 – 2018-08-21 (×3): 1 via ORAL
  Filled 2018-08-20 (×3): qty 1

## 2018-08-20 MED ORDER — LORAZEPAM 2 MG/ML IJ SOLN: 1.0000 mg | Freq: Four times a day (QID) | INTRAMUSCULAR | Status: DC | PRN

## 2018-08-20 MED ORDER — LEVALBUTEROL HCL 1.25 MG/0.5ML IN NEBU
1.2500 mg | INHALATION_SOLUTION | Freq: Four times a day (QID) | RESPIRATORY_TRACT | Status: DC
  Administered 2018-08-20 (×3): 1.25 mg via RESPIRATORY_TRACT
  Filled 2018-08-20 (×3): qty 0.5

## 2018-08-20 MED ORDER — RAMELTEON 8 MG PO TABS
8.0000 mg | ORAL_TABLET | Freq: Once | ORAL | Status: AC
  Administered 2018-08-20: 8 mg via ORAL
  Filled 2018-08-20: qty 1

## 2018-08-20 MED ORDER — IPRATROPIUM BROMIDE 0.02 % IN SOLN
0.5000 mg | RESPIRATORY_TRACT | Status: DC
  Administered 2018-08-20: 0.5 mg via RESPIRATORY_TRACT
  Filled 2018-08-20: qty 2.5

## 2018-08-20 MED ORDER — LEVALBUTEROL HCL 1.25 MG/0.5ML IN NEBU: 1.2500 mg | INHALATION_SOLUTION | Freq: Four times a day (QID) | RESPIRATORY_TRACT | Status: DC

## 2018-08-20 MED ORDER — LORAZEPAM 1 MG PO TABS: 1.0000 mg | ORAL_TABLET | Freq: Four times a day (QID) | ORAL | Status: DC | PRN

## 2018-08-20 MED ORDER — VITAMIN B-1 100 MG PO TABS
100.0000 mg | ORAL_TABLET | Freq: Every day | ORAL | Status: DC
  Administered 2018-08-20 – 2018-08-21 (×2): 100 mg via ORAL
  Filled 2018-08-20 (×2): qty 1

## 2018-08-20 MED ORDER — LORAZEPAM 2 MG/ML IJ SOLN: 0.0000 mg | Freq: Two times a day (BID) | INTRAMUSCULAR | Status: DC

## 2018-08-20 MED ORDER — IPRATROPIUM BROMIDE 0.02 % IN SOLN
0.5000 mg | Freq: Four times a day (QID) | RESPIRATORY_TRACT | Status: DC
  Administered 2018-08-21: 0.5 mg via RESPIRATORY_TRACT
  Filled 2018-08-20: qty 2.5

## 2018-08-20 MED ORDER — LEVALBUTEROL HCL 0.63 MG/3ML IN NEBU: 0.6300 mg | INHALATION_SOLUTION | Freq: Four times a day (QID) | RESPIRATORY_TRACT | Status: DC | PRN

## 2018-08-20 MED ORDER — ENOXAPARIN SODIUM 40 MG/0.4ML ~~LOC~~ SOLN
40.0000 mg | SUBCUTANEOUS | Status: DC
  Administered 2018-08-20: 40 mg via SUBCUTANEOUS
  Filled 2018-08-20: qty 0.4

## 2018-08-20 MED ORDER — CALCIUM CARBONATE ANTACID 500 MG PO CHEW
1.0000 | CHEWABLE_TABLET | Freq: Every day | ORAL | Status: DC
  Administered 2018-08-20 – 2018-08-21 (×2): 200 mg via ORAL
  Filled 2018-08-20 (×2): qty 1

## 2018-08-20 NOTE — ED Notes (Signed)
RN has called Respiratory. RT is in route to patient room ASAP.

## 2018-08-20 NOTE — Progress Notes (Signed)
PROGRESS NOTE    Erin Sloan  WGN:[id number] DOB: Jul 24, 1961 DOA: 08/19/2018 PCP: Patient, No Pcp Per   Brief Narrative:  57 year old with past medical history relevant for alcohol abuse and tobacco abuse admitted with shortness of breath and wheezing and found to have COPD exacerbation.   Assessment & Plan:   Principal Problem:   Acute respiratory failure with hypoxia (HCC) Active Problems:   Tobacco abuse   Acute bronchitis   Hypokalemia   Allergic reaction   Alcohol use   COPD exacerbation (HCC)   #) COPD exacerbation: Patient briefly was on oxygen last night but is now on room air. -Continue scheduled bronchodilators -Continue IV methylprednisolone -Continue azithromycin -Anticipate discharge tomorrow  #) Tobacco abuse: -Counseling provided  #) Alcohol abuse: -Continue thiamine and folate supplementation -Continue CI WA protocol  Fluids: Tolerating p.o. Electrolytes: Monitor and supplement Nutrition: Regular diet  prophylaxis: Enoxaparin   Disposition: Pending improved respiratory status  Full code   Consultants:   None  Procedures:   None  Antimicrobials:   Azithromycin started 08/20/2017   Subjective: This morning patient reports feeling much better.  She reports she is only short of breath when she takes a deep breath in.  She denies any chest pain, nausea, vomiting, diarrhea, cough, congestion, rhinorrhea.  She reports she has had only coughing that is intermittent.  Objective: Vitals:   08/20/18 0730 08/20/18 0813 08/20/18 0817 08/20/18 0958  BP: (!) 121/101   (!) 158/133  Pulse:    77  Resp: (!) 22   18  Temp:      TempSrc:      SpO2:  96% 96% 100%  Weight:      Height:        Intake/Output Summary (Last 24 hours) at 08/20/2018 1407 Last data filed at 08/19/2018 2208 Gross per 24 hour  Intake 1050 ml  Output -  Net 1050 ml   Filed Weights   08/19/18 2054  Weight: 50.8 kg    Examination:  General exam: Appears  calm and comfortable  Respiratory system: No increased work of breathing, scattered rhonchi's, no wheezes or crackles Cardiovascular system: Regular rate and rhythm, no murmurs Gastrointestinal system: Abdomen is nondistended, soft and nontender. No organomegaly or masses felt. Normal bowel sounds heard. Central nervous system: Alert and oriented. No focal neurological deficits. Extremities: Symmetric 5 x 5 power. Skin: No rashes, lesions or ulcers Psychiatry: Judgement and insight appear normal. Mood & affect appropriate.     Data Reviewed: I have personally reviewed following labs and imaging studies  CBC: Recent Labs  Lab 08/19/18 2059  WBC 11.5*  NEUTROABS 10.2*  HGB 16.2*  HCT 49.7*  MCV 104.4*  PLT 252   Basic Metabolic Panel: Recent Labs  Lab 08/19/18 2059 08/20/18 1123  NA 139  --   K 3.1*  --   CL 103  --   CO2 23  --   GLUCOSE 117*  --   BUN 9  --   CREATININE 0.70  --   CALCIUM 9.4  --   MG  --  2.1   GFR: Estimated Creatinine Clearance: 60.7 mL/min (by C-G formula based on SCr of 0.7 mg/dL). Liver Function Tests: Recent Labs  Lab 08/19/18 2059  AST 14*  ALT 12  ALKPHOS 56  BILITOT 0.3  PROT 6.4*  ALBUMIN 3.8   No results for input(s): LIPASE, AMYLASE in the last 168 hours. No results for input(s): AMMONIA in the last 168 hours. Coagulation Profile: No  results for input(s): INR, PROTIME in the last 168 hours. Cardiac Enzymes: No results for input(s): CKTOTAL, CKMB, CKMBINDEX, TROPONINI in the last 168 hours. BNP (last 3 results) No results for input(s): PROBNP in the last 8760 hours. HbA1C: No results for input(s): HGBA1C in the last 72 hours. CBG: No results for input(s): GLUCAP in the last 168 hours. Lipid Profile: No results for input(s): CHOL, HDL, LDLCALC, TRIG, CHOLHDL, LDLDIRECT in the last 72 hours. Thyroid Function Tests: No results for input(s): TSH, T4TOTAL, FREET4, T3FREE, THYROIDAB in the last 72 hours. Anemia Panel: No  results for input(s): VITAMINB12, FOLATE, FERRITIN, TIBC, IRON, RETICCTPCT in the last 72 hours. Sepsis Labs: No results for input(s): PROCALCITON, LATICACIDVEN in the last 168 hours.  No results found for this or any previous visit (from the past 240 hour(s)).       Radiology Studies: Ct Angio Chest Pe W And/or Wo Contrast  Result Date: 08/19/2018 CLINICAL DATA:  57 y/o  F; worsening shortness of breath. EXAM: CT ANGIOGRAPHY CHEST WITH CONTRAST TECHNIQUE: Multidetector CT imaging of the chest was performed using the standard protocol during bolus administration of intravenous contrast. Multiplanar CT image reconstructions and MIPs were obtained to evaluate the vascular anatomy. CONTRAST:  ISOVUE-370 IOPAMIDOL (ISOVUE-370) INJECTION 76% COMPARISON:  08/19/2018 chest radiograph. FINDINGS: Cardiovascular: Satisfactory opacification of the pulmonary arteries to the segmental level. No evidence of pulmonary embolism. Normal heart size. No pericardial effusion. Moderate coronary artery calcific atherosclerosis. Mediastinum/Nodes: No enlarged mediastinal, hilar, or axillary lymph nodes. Thyroid gland, trachea, and esophagus demonstrate no significant findings. Lungs/Pleura: Moderate centrilobular emphysema with upper lobe predominance. Diffuse peribronchial thickening. Few scattered 2-3 mm nodules at the periphery of the lungs. No consolidation, effusion, or pneumothorax. Upper Abdomen: No acute abnormality. Musculoskeletal: No chest wall abnormality. No acute or significant osseous findings. Mild multilevel spondylosis of the thoracic spine. Review of the MIP images confirms the above findings. IMPRESSION: 1. No evidence of pulmonary embolus. 2. Moderate centrilobular emphysema.  Emphysema (ICD10-J43.9). 3. Diffuse peribronchial thickening compatible with bronchitis which may be reactive, inflammatory, or infectious. No consolidation. 4. Few scattered 2-3 mm nodules at the periphery of the lungs. No  follow-up needed if patient is low-risk (and has no known or suspected primary neoplasm). Non-contrast chest CT can be considered in 12 months if patient is high-risk. This recommendation follows the consensus statement: Guidelines for Management of Incidental Pulmonary Nodules Detected on CT Images: From the Fleischner Society 2017; Radiology 2017; 284:228-243. 5. Moderate coronary artery calcific atherosclerosis. Electronically Signed   By: Mitzi Hansen M.D.   On: 08/19/2018 23:02   Dg Chest Port 1 View  Result Date: 08/19/2018 CLINICAL DATA:  Dyspnea and rash EXAM: PORTABLE CHEST 1 VIEW COMPARISON:  None. FINDINGS: Cardiac shadows within normal limits. The lungs are hyperinflated. No focal infiltrate or sizable effusion is seen. No bony abnormality is noted. IMPRESSION: COPD without acute abnormality. Electronically Signed   By: Alcide Clever M.D.   On: 08/19/2018 21:18        Scheduled Meds: . [START ON 08/21/2018] azithromycin  250 mg Oral Daily  . calcium carbonate  1 tablet Oral Daily  . dextromethorphan-guaiFENesin  1 tablet Oral BID  . enoxaparin (LOVENOX) injection  40 mg Subcutaneous Q24H  . folic acid  1 mg Oral Daily  . ipratropium  0.5 mg Nebulization Q6H  . levalbuterol  1.25 mg Nebulization Q6H  . LORazepam  0-4 mg Intravenous Q6H   Followed by  . [START ON 08/22/2018] LORazepam  0-4 mg Intravenous Q12H  . methylPREDNISolone (SOLU-MEDROL) injection  60 mg Intravenous Q12H  . multivitamin with minerals  1 tablet Oral Daily  . nicotine  21 mg Transdermal Daily  . thiamine  100 mg Oral Daily   Or  . thiamine  100 mg Intravenous Daily   Continuous Infusions:   LOS: 0 days    Time spent: 35    Delaine LameShrey C Geneviene Tesch, MD [facility]  If 7PM-7AM, please contact night-coverage [email or web address] Password [email or web address] 08/20/2018, 2:07 PM

## 2018-08-20 NOTE — ED Notes (Signed)
ED TO INPATIENT HANDOFF REPORT  Name/Age/Gender Erin Sloan 57 y.o. female  Code Status    Code Status Orders  (From admission, onward)         Start     Ordered   08/20/18 0218  Full code  Continuous     08/20/18 0218        Code Status History    This patient has a current code status but no historical code status.      Home/SNF/Other Home  Chief Complaint shortness of breath  Level of Care/Admitting Diagnosis ED Disposition    ED Disposition Condition Comment   Admit  [facility]ospital Area: [facility] [[id number]]  Level of Care: Med-Surg [16]  Diagnosis: Acute bronchitis [466.0.ICD-9-CM]  Admitting Physician: Lorretta Harp [[id number]]  Attending Physician: Lorretta Harp [[id number]]  PT Class (Do Not Modify): Observation [104]  PT Acc Code (Do Not Modify): Observation [10022]       Medical History History reviewed. No pertinent past medical history.  Allergies Allergies  Allergen Reactions  . Ibuprofen Swelling  . Amoxicillin Hives    Has patient had a PCN reaction causing immediate rash, facial/tongue/throat swelling, SOB or lightheadedness with hypotension: No Has patient had a PCN reaction causing severe rash involving mucus membranes or skin necrosis: No Has patient had a PCN reaction that required hospitalization: No Has patient had a PCN reaction occurring within the last 10 years: No If all of the above answers are "NO", then may proceed with Cephalosporin use.\    IV Location/Drains/Wounds Patient Lines/Drains/Airways Status   Active Line/Drains/Airways    Name:   Placement date:   Placement time:   Site:   Days:   Peripheral IV 08/19/18 Left Forearm   08/19/18    2120    Forearm   1          Labs/Imaging Results for orders placed or performed during the hospital encounter of 08/19/18 (from the past 48 hour(s))  CBC with Differential/Platelet     Status: Abnormal   Collection Time: 08/19/18  8:59 PM  Result Value Ref Range   WBC 11.5  (H) 4.0 - 10.5 K/uL   RBC 4.76 3.87 - 5.11 MIL/uL   Hemoglobin 16.2 (H) 12.0 - 15.0 g/dL   HCT 54.0 (H) 98.1 - 19.1 %   MCV 104.4 (H) 80.0 - 100.0 fL   MCH 34.0 26.0 - 34.0 pg   MCHC 32.6 30.0 - 36.0 g/dL   RDW 47.8 29.5 - 62.1 %   Platelets 252 150 - 400 K/uL   nRBC 0.0 0.0 - 0.2 %   Neutrophils Relative % 89 %   Neutro Abs 10.2 (H) 1.7 - 7.7 K/uL   Lymphocytes Relative 9 %   Lymphs Abs 1.0 0.7 - 4.0 K/uL   Monocytes Relative 2 %   Monocytes Absolute 0.3 0.1 - 1.0 K/uL   Eosinophils Relative 0 %   Eosinophils Absolute 0.0 0.0 - 0.5 K/uL   Basophils Relative 0 %   Basophils Absolute 0.0 0.0 - 0.1 K/uL   Immature Granulocytes 0 %   Abs Immature Granulocytes 0.05 0.00 - 0.07 K/uL    Comment: Performed at [facility], [address] [place], [place] [address]  Comprehensive metabolic panel     Status: Abnormal   Collection Time: 08/19/18  8:59 PM  Result Value Ref Range   Sodium 139 135 - 145 mmol/L   Potassium 3.1 (L) 3.5 - 5.1 mmol/L   Chloride 103  98 - 111 mmol/L   CO2 23 22 - 32 mmol/L   Glucose, Bld 117 (H) 70 - 99 mg/dL   BUN 9 6 - 20 mg/dL   Creatinine, Ser 5.780.70 0.44 - 1.00 mg/dL   Calcium 9.4 8.9 - 46.910.3 mg/dL   Total Protein 6.4 (L) 6.5 - 8.1 g/dL   Albumin 3.8 3.5 - 5.0 g/dL   AST 14 (L) 15 - 41 U/L   ALT 12 0 - 44 U/L   Alkaline Phosphatase 56 38 - 126 U/L   Total Bilirubin 0.3 0.3 - 1.2 mg/dL   GFR calc non Af Amer >60 >60 mL/min   GFR calc Af Amer >60 >60 mL/min   Anion gap 13 5 - 15    Comment: Performed at [facility], [address] [place], [place] [address]  Blood gas, venous     Status: None   Collection Time: 08/19/18  8:59 PM  Result Value Ref Range   pH, Ven 7.352 7.250 - 7.430   pCO2, Ven 46.0 44.0 - 60.0 mmHg   pO2, Ven 44.3 32.0 - 45.0 mmHg   Bicarbonate 24.8 20.0 - 28.0 mmol/L   Acid-base deficit 0.8 0.0 - 2.0 mmol/L   O2 Saturation 77.6 %   Patient temperature 98.6    Drawn by DRAWN BY RN    Sample  type VENOUS     Comment: Performed at [facility], [address] [place], [place] [address]  I-stat troponin, ED     Status: None   Collection Time: 08/19/18  9:26 PM  Result Value Ref Range   Troponin i, poc 0.02 0.00 - 0.08 ng/mL   Comment 3            Comment: Due to the release kinetics of cTnI, a negative result within the first hours of the onset of symptoms does not rule out myocardial infarction with certainty. If myocardial infarction is still suspected, repeat the test at appropriate intervals.   Influenza panel by PCR (type A & B)     Status: None   Collection Time: 08/19/18 11:00 PM  Result Value Ref Range   Influenza A By PCR NEGATIVE NEGATIVE   Influenza B By PCR NEGATIVE NEGATIVE    Comment: (NOTE) The Xpert Xpress Flu assay is intended as an aid in the diagnosis of  influenza and should not be used as a sole basis for treatment.  This  assay is FDA approved for nasopharyngeal swab specimens only. Nasal  washings and aspirates are unacceptable for Xpert Xpress Flu testing. Performed at [facility], [address] [place], [place] [address]    Ct Angio Chest Pe W And/or Wo Contrast  Result Date: 08/19/2018 CLINICAL DATA:  57 y/o  F; worsening shortness of breath. EXAM: CT ANGIOGRAPHY CHEST WITH CONTRAST TECHNIQUE: Multidetector CT imaging of the chest was performed using the standard protocol during bolus administration of intravenous contrast. Multiplanar CT image reconstructions and MIPs were obtained to evaluate the vascular anatomy. CONTRAST:  100mL ISOVUE-370 IOPAMIDOL (ISOVUE-370) INJECTION 76% COMPARISON:  08/19/2018 chest radiograph. FINDINGS: Cardiovascular: Satisfactory opacification of the pulmonary arteries to the segmental level. No evidence of pulmonary embolism. Normal heart size. No pericardial effusion. Moderate coronary artery calcific atherosclerosis. Mediastinum/Nodes: No enlarged mediastinal, hilar, or  axillary lymph nodes. Thyroid gland, trachea, and esophagus demonstrate no significant findings. Lungs/Pleura: Moderate centrilobular emphysema with upper lobe predominance. Diffuse peribronchial thickening. Few scattered 2-3 mm nodules at the periphery of the  lungs. No consolidation, effusion, or pneumothorax. Upper Abdomen: No acute abnormality. Musculoskeletal: No chest wall abnormality. No acute or significant osseous findings. Mild multilevel spondylosis of the thoracic spine. Review of the MIP images confirms the above findings. IMPRESSION: 1. No evidence of pulmonary embolus. 2. Moderate centrilobular emphysema.  Emphysema (ICD10-J43.9). 3. Diffuse peribronchial thickening compatible with bronchitis which may be reactive, inflammatory, or infectious. No consolidation. 4. Few scattered 2-3 mm nodules at the periphery of the lungs. No follow-up needed if patient is low-risk (and has no known or suspected primary neoplasm). Non-contrast chest CT can be considered in 12 months if patient is high-risk. This recommendation follows the consensus statement: Guidelines for Management of Incidental Pulmonary Nodules Detected on CT Images: From the Fleischner Society 2017; Radiology 2017; 284:228-243. 5. Moderate coronary artery calcific atherosclerosis. Electronically Signed   By: Mitzi Hansen M.D.   On: 08/19/2018 23:02   Dg Chest Port 1 View  Result Date: 08/19/2018 CLINICAL DATA:  Dyspnea and rash EXAM: PORTABLE CHEST 1 VIEW COMPARISON:  None. FINDINGS: Cardiac shadows within normal limits. The lungs are hyperinflated. No focal infiltrate or sizable effusion is seen. No bony abnormality is noted. IMPRESSION: COPD without acute abnormality. Electronically Signed   By: Alcide Clever M.D.   On: 08/19/2018 21:18   EKG Interpretation  Date/Time:  Tuesday August 19 2018 20:53:12 EST Ventricular Rate:  115 PR Interval:    QRS Duration: 91 QT Interval:  337 QTC Calculation: 467 R Axis:   82 Text  Interpretation:  Sinus tachycardia Right atrial enlargement Confirmed by Margarita Grizzle [phone]) on 08/19/2018 11:33:33 PM   Pending Labs Unresulted Labs (From admission, onward)    Start     Ordered   08/21/18 0500  Basic metabolic panel  Tomorrow morning,   R     08/20/18 0752   08/20/18 0500  Magnesium  Tomorrow morning,   R     08/20/18 0216   08/20/18 0218  Culture, sputum-assessment  Once,   R     08/20/18 0218   08/20/18 0217  HIV antibody (Routine Testing)  Once,   R     08/20/18 0218   08/19/18 2059  Blood culture (routine x 2)  BLOOD CULTURE X 2,   STAT     08/19/18 2059          Vitals/Pain Today's Vitals   08/20/18 0730 08/20/18 0813 08/20/18 0817 08/20/18 0958  BP: (!) 121/101   (!) 158/133  Pulse:    77  Resp: (!) 22   18  Temp:      TempSrc:      SpO2:  96% 96% 100%  Weight:      Height:      PainSc:        Isolation Precautions No active isolations  Medications Medications  diphenhydrAMINE (BENADRYL) injection 12.5 mg (has no administration in time range)  methylPREDNISolone sodium succinate (SOLU-MEDROL) 125 mg/2 mL injection 60 mg (60 mg Intravenous Given 08/20/18 0950)  calcium carbonate (TUMS - dosed in mg elemental calcium) chewable tablet 200 mg of elemental calcium (200 mg of elemental calcium Oral Given 08/20/18 0942)  dextromethorphan-guaiFENesin (MUCINEX DM) 30-600 MG per 12 hr tablet 1 tablet (1 tablet Oral Given 08/20/18 0943)  azithromycin (ZITHROMAX) tablet 500 mg (500 mg Oral Given 08/20/18 0255)    Followed by  azithromycin (ZITHROMAX) tablet 250 mg (has no administration in time range)  nicotine (NICODERM CQ - dosed in mg/24 hours) patch 21 mg (21 mg Transdermal  Patch Applied 08/20/18 0943)  enoxaparin (LOVENOX) injection 40 mg (has no administration in time range)  LORazepam (ATIVAN) tablet 1 mg (has no administration in time range)    Or  LORazepam (ATIVAN) injection 1 mg (has no administration in time range)  thiamine (VITAMIN B-1)  tablet 100 mg (100 mg Oral Given 08/20/18 0942)    Or  thiamine (B-1) injection 100 mg ( Intravenous See Alternative 08/20/18 0942)  folic acid (FOLVITE) tablet 1 mg (1 mg Oral Given 08/20/18 0942)  multivitamin with minerals tablet 1 tablet (1 tablet Oral Given 08/20/18 0942)  LORazepam (ATIVAN) injection 0-4 mg (0 mg Intravenous Given 08/20/18 0628)    Followed by  LORazepam (ATIVAN) injection 0-4 mg (has no administration in time range)  ipratropium (ATROVENT) nebulizer solution 0.5 mg (0.5 mg Nebulization Given 08/20/18 0813)  levalbuterol (XOPENEX) nebulizer solution 1.25 mg (1.25 mg Nebulization Given 08/20/18 0813)  albuterol (PROVENTIL) (2.5 MG/3ML) 0.083% nebulizer solution 5 mg (5 mg Nebulization Given 08/19/18 2106)  methylPREDNISolone sodium succinate (SOLU-MEDROL) 125 mg/2 mL injection 125 mg (125 mg Intravenous Given 08/19/18 2131)  diphenhydrAMINE (BENADRYL) injection 50 mg (50 mg Intravenous Given 08/19/18 2131)  sodium chloride 0.9 % bolus 1,000 mL (0 mLs Intravenous Stopped 08/19/18 2158)  famotidine (PEPCID) IVPB 20 mg premix (0 mg Intravenous Stopped 08/19/18 2208)  sodium chloride (PF) 0.9 % injection (  Given by Other 08/20/18 0725)  iopamidol (ISOVUE-370) 76 % injection 100 mL ( Intravenous Contrast Given 08/20/18 0615)  albuterol (PROVENTIL) (2.5 MG/3ML) 0.083% nebulizer solution 5 mg (5 mg Nebulization Given 08/20/18 0020)  potassium chloride 20 MEQ/15ML (10%) solution 40 mEq (40 mEq Oral Given 08/20/18 0257)    Mobility walks

## 2018-08-20 NOTE — Progress Notes (Signed)
Received report from Christeen DouglasMerle, RN, in the ED at 1034.

## 2018-08-21 DIAGNOSIS — J9601 Acute respiratory failure with hypoxia: Secondary | ICD-10-CM | POA: Diagnosis not present

## 2018-08-21 LAB — HIV ANTIBODY (ROUTINE TESTING W REFLEX): HIV Screen 4th Generation wRfx: NONREACTIVE

## 2018-08-21 LAB — EXPECTORATED SPUTUM ASSESSMENT W GRAM STAIN, RFLX TO RESP C

## 2018-08-21 LAB — BASIC METABOLIC PANEL
Anion gap: 6 (ref 5–15)
BUN: 7 mg/dL (ref 6–20)
CO2: 26 mmol/L (ref 22–32)
Calcium: 8.8 mg/dL — ABNORMAL LOW (ref 8.9–10.3)
Chloride: 107 mmol/L (ref 98–111)
Creatinine, Ser: 0.42 mg/dL — ABNORMAL LOW (ref 0.44–1.00)
GFR calc Af Amer: >60 mL/min (ref >60)
GFR calc non Af Amer: >60 mL/min (ref >60)
Glucose, Bld: 144 mg/dL — ABNORMAL HIGH (ref 70–99)
Potassium: 4.3 mmol/L (ref 3.5–5.1)
Sodium: 139 mmol/L (ref 135–145)

## 2018-08-21 MED ORDER — PREDNISONE 10 MG PO TABS: 40.0000 mg | ORAL_TABLET | Freq: Every day | ORAL | 0 refills | Status: AC

## 2018-08-21 MED ORDER — ALBUTEROL SULFATE HFA 108 (90 BASE) MCG/ACT IN AERS: 2.0000 | INHALATION_SPRAY | Freq: Four times a day (QID) | RESPIRATORY_TRACT | 2 refills | Status: DC | PRN

## 2018-08-21 MED ORDER — AZITHROMYCIN 250 MG PO TABS: 250.0000 mg | ORAL_TABLET | Freq: Every day | ORAL | 0 refills | Status: AC

## 2018-08-21 MED ORDER — NICOTINE 7 MG/24HR TD PT24: 7.0000 mg | MEDICATED_PATCH | TRANSDERMAL | 0 refills | Status: DC

## 2018-08-21 MED ORDER — BUTALBITAL-APAP-CAFFEINE 50-325-40 MG PO TABS
2.0000 | ORAL_TABLET | Freq: Once | ORAL | Status: AC
  Administered 2018-08-21: 2 via ORAL
  Filled 2018-08-21: qty 2

## 2018-08-21 MED ORDER — DM-GUAIFENESIN ER 30-600 MG PO TB12: 1.0000 | ORAL_TABLET | Freq: Two times a day (BID) | ORAL | 0 refills | Status: AC

## 2018-08-21 NOTE — Discharge Summary (Signed)
Physician Discharge Summary  Erin Sloan ZOX:[id number] DOB: 1961/03/19 DOA: 08/19/2018  PCP: Patient, No Pcp Per  Admit date: 08/19/2018 Discharge date: 08/21/2018  Admitted From: Home Disposition:  Home  Recommendations for Outpatient Follow-up:  1. Follow up with PCP in 1-2 weeks 2. Please obtain BMP/CBC in one week   Home Health: No Equipment/Devices:No  Discharge Condition: stable CODE STATUS: FULL Diet recommendation: Regular   Brief/Interim Summary:  #) COPD exacerbation: Patient was admitted with shortness of breath and wheezing.  She was noted to have COPD exacerbation.  Patient was started on oral azithromycin, IV methylprednisolone and scheduled bronchodilators.  Patient improved dramatically.  She was discharged home on oral steroids, oral azithromycin for 2 additional days as well as given an albuterol inhaler.  #) Tobacco abuse: Counseling was provided.  Patient was given phone number for [place] quit line.  Patient was given prescription for nicotine patch.   #) Alcohol abuse: Patient was given thiamine and folate supplementation.  She was maintained on CIWA protocol.  She did not require any PRN benzodiazepines withdrawal.  She was given counseling on decreasing the amount of alcohol that she drank.  Discharge Diagnoses:  Principal Problem:   Acute respiratory failure with hypoxia (HCC) Active Problems:   Tobacco abuse   Acute bronchitis   Hypokalemia   Allergic reaction   Alcohol use   COPD exacerbation [facility])    Discharge Instructions  Discharge Instructions    Call MD for:  difficulty breathing, headache or visual disturbances   Complete by:  As directed    Call MD for:  hives   Complete by:  As directed    Call MD for:  persistant dizziness or light-headedness   Complete by:  As directed    Call MD for:  persistant nausea and vomiting   Complete by:  As directed    Call MD for:  redness, tenderness, or signs of infection (pain, swelling,  redness, odor or green/yellow discharge around incision site)   Complete by:  As directed    Call MD for:  severe uncontrolled pain   Complete by:  As directed    Call MD for:  temperature >100.4   Complete by:  As directed    Diet - low sodium heart healthy   Complete by:  As directed    Discharge instructions   Complete by:  As directed    Please follow-up with your primary care doctor in 1 week.  Please take your antibiotics and steroids as prescribed.   Increase activity slowly   Complete by:  As directed      Allergies as of 08/21/2018      Reactions   Ibuprofen Swelling   Amoxicillin Hives   Has patient had a PCN reaction causing immediate rash, facial/tongue/throat swelling, SOB or lightheadedness with hypotension: No Has patient had a PCN reaction causing severe rash involving mucus membranes or skin necrosis: No Has patient had a PCN reaction that required hospitalization: No Has patient had a PCN reaction occurring within the last 10 years: No If all of the above answers are "NO", then may proceed with Cephalosporin use.\      Medication List    STOP taking these medications   amoxicillin-clavulanate 875-125 MG tablet Commonly known as:  AUGMENTIN     TAKE these medications   albuterol 108 (90 Base) MCG/ACT inhaler Commonly known as:  PROVENTIL HFA;VENTOLIN HFA Inhale 2 puffs into the lungs every 6 (six) hours as needed for wheezing or  shortness of breath.   azithromycin 250 MG tablet Commonly known as:  ZITHROMAX Take 1 tablet (250 mg total) by mouth daily for 2 days.   calcium carbonate 500 MG chewable tablet Commonly known as:  TUMS - dosed in mg elemental calcium Chew 1 tablet by mouth daily.   dextromethorphan-guaiFENesin 30-600 MG 12hr tablet Commonly known as:  MUCINEX DM Take 1 tablet by mouth 2 (two) times daily for 10 days.   nicotine 7 mg/24hr patch Commonly known as:  NICODERM CQ - dosed in mg/24 hr Place 1 patch (7 mg total) onto the skin  daily.   predniSONE 10 MG tablet Commonly known as:  DELTASONE Take 4 tablets (40 mg total) by mouth daily for 4 days. What changed:    how much to take  when to take this  additional instructions       Allergies  Allergen Reactions  . Ibuprofen Swelling  . Amoxicillin Hives    Has patient had a PCN reaction causing immediate rash, facial/tongue/throat swelling, SOB or lightheadedness with hypotension: No Has patient had a PCN reaction causing severe rash involving mucus membranes or skin necrosis: No Has patient had a PCN reaction that required hospitalization: No Has patient had a PCN reaction occurring within the last 10 years: No If all of the above answers are "NO", then may proceed with Cephalosporin use.\    Consultations: None  Procedures/Studies: Ct Angio Chest Pe W And/or Wo Contrast  Result Date: 08/19/2018 CLINICAL DATA:  57 y/o  F; worsening shortness of breath. EXAM: CT ANGIOGRAPHY CHEST WITH CONTRAST TECHNIQUE: Multidetector CT imaging of the chest was performed using the standard protocol during bolus administration of intravenous contrast. Multiplanar CT image reconstructions and MIPs were obtained to evaluate the vascular anatomy. CONTRAST:  ISOVUE-370 IOPAMIDOL (ISOVUE-370) INJECTION 76% COMPARISON:  08/19/2018 chest radiograph. FINDINGS: Cardiovascular: Satisfactory opacification of the pulmonary arteries to the segmental level. No evidence of pulmonary embolism. Normal heart size. No pericardial effusion. Moderate coronary artery calcific atherosclerosis. Mediastinum/Nodes: No enlarged mediastinal, hilar, or axillary lymph nodes. Thyroid gland, trachea, and esophagus demonstrate no significant findings. Lungs/Pleura: Moderate centrilobular emphysema with upper lobe predominance. Diffuse peribronchial thickening. Few scattered 2-3 mm nodules at the periphery of the lungs. No consolidation, effusion, or pneumothorax. Upper Abdomen: No acute abnormality.  Musculoskeletal: No chest wall abnormality. No acute or significant osseous findings. Mild multilevel spondylosis of the thoracic spine. Review of the MIP images confirms the above findings. IMPRESSION: 1. No evidence of pulmonary embolus. 2. Moderate centrilobular emphysema.  Emphysema (ICD10-J43.9). 3. Diffuse peribronchial thickening compatible with bronchitis which may be reactive, inflammatory, or infectious. No consolidation. 4. Few scattered 2-3 mm nodules at the periphery of the lungs. No follow-up needed if patient is low-risk (and has no known or suspected primary neoplasm). Non-contrast chest CT can be considered in 12 months if patient is high-risk. This recommendation follows the consensus statement: Guidelines for Management of Incidental Pulmonary Nodules Detected on CT Images: From the Fleischner Society 2017; Radiology 2017; 284:228-243. 5. Moderate coronary artery calcific atherosclerosis. Electronically Signed   By: Mitzi Hansen M.D.   On: 08/19/2018 23:02   Dg Chest Port 1 View  Result Date: 08/19/2018 CLINICAL DATA:  Dyspnea and rash EXAM: PORTABLE CHEST 1 VIEW COMPARISON:  None. FINDINGS: Cardiac shadows within normal limits. The lungs are hyperinflated. No focal infiltrate or sizable effusion is seen. No bony abnormality is noted. IMPRESSION: COPD without acute abnormality. Electronically Signed   By: Eulah Pont.D.  On: 08/19/2018 21:18      Subjective:   Discharge Exam: Vitals:   08/21/18 0551 08/21/18 0736  BP: (!) 132/91   Pulse: 73   Resp: 20   Temp: 98.1 F (36.7 C)   SpO2: 97% 97%   Vitals:   08/20/18 1947 08/20/18 2221 08/21/18 0551 08/21/18 0736  BP:  (!) 152/84 (!) 132/91   Pulse:  93 73   Resp:  20 20   Temp:  98.7 F (37.1 C) 98.1 F (36.7 C)   TempSrc:  Oral Oral   SpO2: 95% 98% 97% 97%  Weight:      Height:       General exam: Appears calm and comfortable  Respiratory system: No increased work of breathing, scattered rhonchi's,  no wheezes or crackles Cardiovascular system: Regular rate and rhythm, no murmurs Gastrointestinal system: Abdomen is nondistended, soft and nontender. No organomegaly or masses felt. Normal bowel sounds heard. Central nervous system: Alert and oriented. No focal neurological deficits. Extremities: Symmetric 5 x 5 power. Skin: No rashes, lesions or ulcers Psychiatry: Judgement and insight appear normal. Mood & affect appropriate.     The results of significant diagnostics from this hospitalization (including imaging, microbiology, ancillary and laboratory) are listed below for reference.     Microbiology: Recent Results (from the past 240 hour(s))  Culture, sputum-assessment     Status: None   Collection Time: 08/20/18  2:18 AM  Result Value Ref Range Status   Specimen Description SPUTUM  Final   Special Requests NONE  Final   Sputum evaluation   Final    THIS SPECIMEN IS ACCEPTABLE FOR SPUTUM CULTURE Performed at [facility], [address], [place], [place] [address]    Report Status 08/21/2018 FINAL  Final     Labs: BNP (last 3 results) No results for input(s): BNP in the last 8760 hours. Basic Metabolic Panel: Recent Labs  Lab 08/19/18 2059 08/20/18 1123 08/21/18 0337  NA 139  --  139  K 3.1*  --  4.3  CL 103  --  107  CO2 23  --  26  GLUCOSE 117*  --  144*  BUN 9  --  7  CREATININE 0.70  --  0.42*  CALCIUM 9.4  --  8.8*  MG  --  2.1  --    Liver Function Tests: Recent Labs  Lab 08/19/18 2059  AST 14*  ALT 12  ALKPHOS 56  BILITOT 0.3  PROT 6.4*  ALBUMIN 3.8   No results for input(s): LIPASE, AMYLASE in the last 168 hours. No results for input(s): AMMONIA in the last 168 hours. CBC: Recent Labs  Lab 08/19/18 2059  WBC 11.5*  NEUTROABS 10.2*  HGB 16.2*  HCT 49.7*  MCV 104.4*  PLT 252   Cardiac Enzymes: No results for input(s): CKTOTAL, CKMB, CKMBINDEX, TROPONINI in the last 168 hours. BNP: Invalid input(s):  POCBNP CBG: No results for input(s): GLUCAP in the last 168 hours. D-Dimer No results for input(s): DDIMER in the last 72 hours. Hgb A1c No results for input(s): HGBA1C in the last 72 hours. Lipid Profile No results for input(s): CHOL, HDL, LDLCALC, TRIG, CHOLHDL, LDLDIRECT in the last 72 hours. Thyroid function studies No results for input(s): TSH, T4TOTAL, T3FREE, THYROIDAB in the last 72 hours.  Invalid input(s): FREET3 Anemia work up No results for input(s): VITAMINB12, FOLATE, FERRITIN, TIBC, IRON, RETICCTPCT in the last 72 hours. Urinalysis    Component Value Date/Time   COLORURINE yellow  02/14/2010 1049   APPEARANCEUR Clear 02/14/2010 1049   LABSPEC 1.015 02/14/2010 1049   PHURINE 6.5 02/14/2010 1049   HGBUR negative 02/14/2010 1049   BILIRUBINUR negative 02/14/2010 1049   UROBILINOGEN 0.2 02/14/2010 1049   NITRITE negative 02/14/2010 1049   Sepsis Labs Invalid input(s): PROCALCITONIN,  WBC,  LACTICIDVEN Microbiology Recent Results (from the past 240 hour(s))  Culture, sputum-assessment     Status: None   Collection Time: 08/20/18  2:18 AM  Result Value Ref Range Status   Specimen Description SPUTUM  Final   Special Requests NONE  Final   Sputum evaluation   Final    THIS SPECIMEN IS ACCEPTABLE FOR SPUTUM CULTURE Performed at [facility], [address] [place], [place] [address]    Report Status 08/21/2018 FINAL  Final     Time coordinating discharge: 35  SIGNED:   Delaine LameShrey C Jaiona Simien, MD  [facility] 08/21/2018, 11:50 AM  If 7PM-7AM, please contact night-coverage [email or web address] Password [email or web address]

## 2018-08-21 NOTE — Care Management (Signed)
CM consult for COPD. Pt states she has a PCP and can obtain her medications. She knows when she will need to follow up with her PCP. Pt will dc without home 02. Sandford Crazeora Somara Frymire RN,BSN [phone]

## 2018-08-21 NOTE — Discharge Instructions (Signed)

## 2018-08-21 NOTE — Progress Notes (Signed)
Patient discharged to home with family, discharge instructions reviewed with patient who verbalized understanding. New RX's given to patient and work note.

## 2018-08-23 LAB — CULTURE, RESPIRATORY W GRAM STAIN: Culture: NORMAL

## 2018-08-25 LAB — CULTURE, BLOOD (ROUTINE X 2)
CULTURE: NO GROWTH
Culture: NO GROWTH
SPECIAL REQUESTS: ADEQUATE
Special Requests: ADEQUATE

## 2018-08-26 ENCOUNTER — Encounter: Payer: Self-pay | Admitting: Medical

## 2018-08-26 ENCOUNTER — Ambulatory Visit: Payer: 59 | Admitting: Medical

## 2018-08-26 VITALS — BP 132/96 | HR 90 | Temp 98.0°F | Resp 16 | Ht 64.0 in | Wt 112.0 lb

## 2018-08-26 DIAGNOSIS — J209 Acute bronchitis, unspecified: Secondary | ICD-10-CM | POA: Diagnosis not present

## 2018-08-26 DIAGNOSIS — F1010 Alcohol abuse, uncomplicated: Secondary | ICD-10-CM

## 2018-08-26 DIAGNOSIS — J440 Chronic obstructive pulmonary disease with acute lower respiratory infection: Secondary | ICD-10-CM

## 2018-08-26 DIAGNOSIS — R739 Hyperglycemia, unspecified: Secondary | ICD-10-CM | POA: Diagnosis not present

## 2018-08-26 DIAGNOSIS — F17200 Nicotine dependence, unspecified, uncomplicated: Secondary | ICD-10-CM | POA: Diagnosis not present

## 2018-08-26 DIAGNOSIS — E876 Hypokalemia: Secondary | ICD-10-CM | POA: Diagnosis not present

## 2018-08-26 DIAGNOSIS — F32A Depression, unspecified: Secondary | ICD-10-CM

## 2018-08-26 DIAGNOSIS — J329 Chronic sinusitis, unspecified: Secondary | ICD-10-CM | POA: Diagnosis not present

## 2018-08-26 DIAGNOSIS — F329 Major depressive disorder, single episode, unspecified: Secondary | ICD-10-CM

## 2018-08-26 LAB — COMPREHENSIVE METABOLIC PANEL
ALT: 18 U/L (ref 0–35)
AST: 10 U/L (ref 0–37)
Albumin: 4.2 g/dL (ref 3.5–5.2)
Alkaline Phosphatase: 63 U/L (ref 39–117)
BUN: 13 mg/dL (ref 6–23)
CO2: 28 mEq/L (ref 19–32)
Calcium: 9.3 mg/dL (ref 8.4–10.5)
Chloride: 99 mEq/L (ref 96–112)
Creatinine, Ser: 0.64 mg/dL (ref 0.40–1.20)
GFR: 100.83 mL/min (ref >60.00)
Glucose, Bld: 129 mg/dL — ABNORMAL HIGH (ref 70–99)
Potassium: 4.7 mEq/L (ref 3.5–5.1)
Sodium: 135 mEq/L (ref 135–145)
Total Bilirubin: 0.3 mg/dL (ref 0.2–1.2)
Total Protein: 6.7 g/dL (ref 6.0–8.3)

## 2018-08-26 LAB — CBC WITH DIFFERENTIAL/PLATELET
Basophils Absolute: 0 10*3/uL (ref 0.0–0.1)
Basophils Relative: 0.3 % (ref 0.0–3.0)
Eosinophils Absolute: 0 10*3/uL (ref 0.0–0.7)
Eosinophils Relative: 0.2 % (ref 0.0–5.0)
HCT: 47.5 % — ABNORMAL HIGH (ref 36.0–46.0)
Hemoglobin: 16 g/dL — ABNORMAL HIGH (ref 12.0–15.0)
Lymphocytes Relative: 9.1 % — ABNORMAL LOW (ref 12.0–46.0)
Lymphs Abs: 0.8 10*3/uL (ref 0.7–4.0)
MCHC: 33.7 g/dL (ref 30.0–36.0)
MCV: 102 fl — ABNORMAL HIGH (ref 78.0–100.0)
Monocytes Absolute: 0.7 10*3/uL (ref 0.1–1.0)
Monocytes Relative: 7.7 % (ref 3.0–12.0)
Neutro Abs: 7 10*3/uL (ref 1.4–7.7)
Neutrophils Relative %: 82.7 % — ABNORMAL HIGH (ref 43.0–77.0)
Platelets: 326 10*3/uL (ref 150.0–400.0)
RBC: 4.65 Mil/uL (ref 3.87–5.11)
RDW: 13.3 % (ref 11.5–15.5)
WBC: 8.5 10*3/uL (ref 4.0–10.5)

## 2018-08-26 LAB — HEMOGLOBIN A1C: Hgb A1c MFr Bld: 5.8 % (ref 4.6–6.5)

## 2018-08-26 LAB — GAMMA GT: GGT: 21 U/L (ref 7–51)

## 2018-08-26 MED ORDER — SERTRALINE HCL 25 MG PO TABS: 25.0000 mg | ORAL_TABLET | Freq: Every day | ORAL | 0 refills | Status: DC

## 2018-08-26 MED ORDER — BUDESONIDE-FORMOTEROL FUMARATE 80-4.5 MCG/ACT IN AERO: 2.0000 | INHALATION_SPRAY | Freq: Two times a day (BID) | RESPIRATORY_TRACT | 3 refills | Status: DC

## 2018-08-26 MED ORDER — CYCLOBENZAPRINE HCL 5 MG PO TABS: 5.0000 mg | ORAL_TABLET | Freq: Every day | ORAL | 0 refills | Status: DC

## 2018-08-26 MED ORDER — NICOTINE 21 MG/24HR TD PT24: 21.0000 mg | MEDICATED_PATCH | Freq: Every day | TRANSDERMAL | 0 refills | Status: DC

## 2018-08-26 MED ORDER — DOXYCYCLINE HYCLATE 100 MG PO TABS: 100.0000 mg | ORAL_TABLET | Freq: Two times a day (BID) | ORAL | 0 refills | Status: DC

## 2018-08-26 NOTE — Patient Instructions (Addendum)
You had recent hospitalization for COPD and bronchitis.  With a history of prolonged smoking and describes severe shortness of breath I do think it is best for you to go ahead and get prescription of Symbicort inhaler.  Use 2puffs twice daily.  If while using Symbicort you still have shortness of breath or wheezing then you could use albuterol.  Based on your prolonged smoking history and recent COPD type findings went ahead and referred you to pulmonologist.  You have had some recent nasal congestion and some frontal sinus pressure.  You finish azithromycin but will add doxycycline to take twice daily for the next 7 days.  Rx advisement given.  For smoking cessation, I did write you for the 21 mg nicotine patch.  Hopefully this will be covered.  Then plan to taper down to lower dosages over the next 2 months.  Decrease number cigarettes  Recent mild WBC elevation in the hospital and a low potassium.  Will repeat CBC and metabolic panel today.  Also added A1c since her sugars were mildly elevated.  With history of heavy alcohol use, I did put in a GGT lab to assess liver function.  Please stop all alcohol use.  For mild chronic intermittent headaches with some neck trapezius type pain, I want you to use Tylenol when you feel this headaches come on and use low-dose Flexeril 5 mg.  Rx advisement given.  For depression, prescribed sertraline.  Follow-up in 2 weeks or as needed.

## 2018-08-26 NOTE — Progress Notes (Signed)
Subjective:    Patient ID: Erin Sloan, female    DOB: Feb 04, 1961, 57 y.o.   MRN: [id number]  HPI   Pt in for first time.  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  She states in past she relied mostly on urgent cares. Recent hospitalized with copd exacerbation. She also has hx of tobacco abuse and alcohol abuse.    Regarding copd she was given  azithromycin,  Iv methyl prednisone and bronchodilators. On discharge oral steroid and oral 2 days of azithromycin. Pt is using albuterol once a day.   She has smoked 40 or more years. She states one pack a day smoker. She has nicotine patch.  Pt was to repeat cbc and cmp post hospitalization. Mild wbc elevated count. Her potassium was low in the hospital. Sugar was also mild elevated.  Regarding alcohol abuse was given thiamine and folate. Pt states she drinks alcohol varying 1-4 beers a day now.   Pt has been depressed since death of her husband 4 years ago. She realizes may need to be on medication. She states willing to try meds.     Review of Systems  Constitutional: Negative for chills, fatigue and fever.  HENT: Positive for congestion, sinus pressure and sinus pain. Negative for mouth sores, nosebleeds, postnasal drip, sneezing and sore throat.   Respiratory: Positive for cough and wheezing. Negative for chest tightness.        Wheezing some in morning.  Cardiovascular: Negative for chest pain and palpitations.  Gastrointestinal: Negative for abdominal pain and nausea.  Musculoskeletal: Negative for back pain.  Skin: Negative for rash.  Neurological: Negative for dizziness, seizures, weakness and numbness.       Hx of frontal ha on and off for years. Comes and goes during the day.  Intermittent chronic mild frontal ha. Notes neck pain when she gets ha.  Hematological: Negative for adenopathy. Does not bruise/bleed easily.  Psychiatric/Behavioral: Positive for dysphoric  mood. Negative for behavioral problems, decreased concentration, sleep disturbance and suicidal ideas. The patient is not nervous/anxious and is not hyperactive.      Past Medical History:  Diagnosis Date  . COPD (chronic obstructive pulmonary disease) (HCC)   . Depression   . Head ache      Social History   Socioeconomic History  . Marital status: Widowed    Spouse name: Not on file  . Number of children: Not on file  . Years of education: Not on file  . Highest education level: Not on file  Occupational History  . Not on file  Social Needs  . Financial resource strain: Not on file  . Food insecurity:    Worry: Not on file    Inability: Not on file  . Transportation needs:    Medical: Not on file    Non-medical: Not on file  Tobacco Use  . Smoking status: Current Every Day Smoker    Packs/day: 1.00    Types: Cigarettes  . Smokeless tobacco: Never Used  Substance and Sexual Activity  . Alcohol use: Yes    Comment: socially  . Drug use: No  . Sexual activity: Not on file  Lifestyle  . Physical activity:    Days per week: Not on file    Minutes per session: Not on file  . Stress: Not on file  Relationships  . Social connections:    Talks on phone: Not on file    Gets together: Not on file  Attends religious service: Not on file    Active member of club or organization: Not on file    Attends meetings of clubs or organizations: Not on file    Relationship status: Not on file  . Intimate partner violence:    Fear of current or ex partner: Not on file    Emotionally abused: Not on file    Physically abused: Not on file    Forced sexual activity: Not on file  Other Topics Concern  . Not on file  Social History Narrative  . Not on file    Past Surgical History:  Procedure Laterality Date  . CARPAL TUNNEL RELEASE    . CESAREAN SECTION     1985  . CESAREAN SECTION     1987    Family History  Problem Relation Age of Onset  . Pancreatic cancer Mother     . Heart disease Father   . Heart attack Father     Allergies  Allergen Reactions  . Ibuprofen Swelling  . Amoxicillin Hives    Has patient had a PCN reaction causing immediate rash, facial/tongue/throat swelling, SOB or lightheadedness with hypotension: No Has patient had a PCN reaction causing severe rash involving mucus membranes or skin necrosis: No Has patient had a PCN reaction that required hospitalization: No Has patient had a PCN reaction occurring within the last 10 years: No If all of the above answers are "NO", then may proceed with Cephalosporin use.\    Current Outpatient Medications on File Prior to Visit  Medication Sig Dispense Refill  . albuterol (PROVENTIL HFA;VENTOLIN HFA) 108 (90 Base) MCG/ACT inhaler Inhale 2 puffs into the lungs every 6 (six) hours as needed for wheezing or shortness of breath. 1 Inhaler 2  . dextromethorphan-guaiFENesin (MUCINEX DM) 30-600 MG 12hr tablet Take 1 tablet by mouth 2 (two) times daily for 10 days. 20 tablet 0  . nicotine (NICODERM CQ - DOSED IN MG/24 HR) 7 mg/24hr patch Place 1 patch (7 mg total) onto the skin daily. 30 patch 0  . predniSONE (DELTASONE) 10 MG tablet Take 10 mg by mouth 4 (four) times daily.     No current facility-administered medications on file prior to visit.     BP (!) 132/96 (BP Location: Left Arm, Patient Position: Sitting, Cuff Size: Normal)   Pulse 90   Temp 98 F (36.7 C) (Oral)   Resp 16   Ht 5\' 4"  (1.626 m)   Wt 112 lb (50.8 kg)   SpO2 97%   BMI 19.22 kg/m       Objective:   Physical Exam  General  Mental Status - Alert. General Appearance - Well groomed. Not in acute distress.  Skin Rashes- No Rashes.  HEENT Head- Normal. Ear Auditory Canal - Left- Normal. Right - Normal.Tympanic Membrane- Left- Normal. Right- Normal. Eye Sclera/Conjunctiva- Left- Normal. Right- Normal. Nose & Sinuses Nasal Mucosa- Left-  Boggy and Congested. Right-  Boggy and  Congested.Bilateral no maxillary but  some  frontal sinus pressure. Mouth & Throat Lips: Upper Lip- Normal: no dryness, cracking, pallor, cyanosis, or vesicular eruption. Lower Lip-Normal: no dryness, cracking, pallor, cyanosis or vesicular eruption. Buccal Mucosa- Bilateral- No Aphthous ulcers. Oropharynx- No Discharge or Erythema. Tonsils: Characteristics- Bilateral- No Erythema or Congestion. Size/Enlargement- Bilateral- No enlargement. Discharge- bilateral-None.  Neck Neck- Supple. No Masses. Mild trapezius tenderness to palpation.   Chest and Lung Exam Auscultation: Breath Sounds:-Clear even and unlabored.  Cardiovascular Auscultation:Rythm- Regular, rate and rhythm. Murmurs & Other Heart Sounds:Ausculatation  of the heart reveal- No Murmurs.  Lymphatic Head & Neck General Head & Neck Lymphatics: Bilateral: Description- No Localized lymphadenopathy.   Neurologic Cranial Nerve exam:- CN III-XII intact(No nystagmus), symmetric smile. Strength:- 5/5 equal and symmetric strength both upper and lower extremities.      Assessment & Plan:  You had recent hospitalization for COPD and bronchitis.  With a history of prolonged smoking and describes severe shortness of breath I do think it is best for you to go ahead and get prescription of Symbicort inhaler.  Use 2puffs twice daily.  If while using Symbicort you still have shortness of breath or wheezing then you could use albuterol.  Based on your prolonged smoking history and recent COPD type findings went ahead and referred you to pulmonologist.  You have had some recent nasal congestion and some frontal sinus pressure.  You finish azithromycin but will add doxycycline to take twice daily for the next 7 days.  Rx advisement given.  For smoking cessation, I did write you for the 21 mg nicotine patch.  Hopefully this will be covered.  Then plan to taper down to lower dosages over the next 2 months.  Decrease number cigarettes  Recent mild WBC elevation in the hospital and  a low potassium.  Will repeat CBC and metabolic panel today.  Also added A1c since her sugars were mildly elevated.  With history of heavy alcohol use, I did put in a GGT lab to assess liver function.  Please stop all alcohol use.  For mild chronic intermittent headaches with some neck trapezius type pain, I want you to use Tylenol when you feel this headaches come on and use low-dose Flexeril 5 mg.  Rx advisement given.  For depression, prescribed sertraline.  Follow-up in 2 weeks or as needed.  45 minutes spent with patient today.  This was new patient appointment with ED evaluation and eventual hospitalization.  50% spent counseling patient on her recent hospitalization and and chronic conditions.  Explained the post hospitalization work-up and treatment plan going forward.

## 2018-09-11 ENCOUNTER — Encounter: Payer: Self-pay | Admitting: Medical

## 2018-09-11 ENCOUNTER — Ambulatory Visit: Payer: 59 | Admitting: Medical

## 2018-09-11 VITALS — BP 140/88 | HR 76 | Temp 98.6°F | Resp 16 | Ht 64.0 in | Wt 116.6 lb

## 2018-09-11 DIAGNOSIS — F329 Major depressive disorder, single episode, unspecified: Secondary | ICD-10-CM | POA: Diagnosis not present

## 2018-09-11 DIAGNOSIS — F32A Depression, unspecified: Secondary | ICD-10-CM

## 2018-09-11 DIAGNOSIS — R51 Headache: Secondary | ICD-10-CM

## 2018-09-11 DIAGNOSIS — J440 Chronic obstructive pulmonary disease with acute lower respiratory infection: Secondary | ICD-10-CM | POA: Diagnosis not present

## 2018-09-11 DIAGNOSIS — F17200 Nicotine dependence, unspecified, uncomplicated: Secondary | ICD-10-CM | POA: Diagnosis not present

## 2018-09-11 DIAGNOSIS — R519 Headache, unspecified: Secondary | ICD-10-CM

## 2018-09-11 DIAGNOSIS — J209 Acute bronchitis, unspecified: Secondary | ICD-10-CM

## 2018-09-11 MED ORDER — AMLODIPINE BESYLATE 2.5 MG PO TABS: 2.5000 mg | ORAL_TABLET | Freq: Every day | ORAL | 0 refills | Status: DC

## 2018-09-11 NOTE — Patient Instructions (Addendum)
Your recent sinus infection and bronchitis appears to be resolved now.  For your history of smoking and COPD, continue with Symbicort daily and use albuterol as needed.  The pulmonology referral is pending.  For smoking cessation, continue with nicotine patches.  Continue to taper off of the cigarettes.  I am considering prescribing you Wellbutrin in the future but want to make sure that your blood pressure is well controlled before giving you that prescription.  Your depression is intermittently recently.  Not severe and you did not respond to a sertraline.  In the near future will try Wellbutrin once your blood pressure is a little bit better control.  For mild high blood pressure, I gave you amlodipine 2.5 mg dose.  Regarding chronic daily headache history, I decided to go ahead and put an order for CT of head without contrast.  It is possible that slight blood pressure elevation might be contributing to your daily headaches.  We will see with the blood pressure medications if headaches I decreased some.  Also want you to go ahead and schedule yourself appoint with optometrist your vision checked.  Want you to follow-up in 2 weeks for nurse blood pressure check.  Please get that  Esperanza RichtersEdward Oceana Walthall, PA-C scheduled on the way out.  If BP is closer to 130/80 then will likely send you an Wellbutrin prescription to help you stop smoking.  Follow-up date with me also will be dependent on blood pressure reading.

## 2018-09-11 NOTE — Progress Notes (Signed)
Subjective:    Patient ID: Erin Sloan, female    DOB: [birth year], 58 y.o.   MRN: [id number]  HPI  Pt in for follow up on copd, bronchitis and sinus infection. Pt states breathing is well controlled. She is using symbicort daily. Pt only had to use albuterol. Pt not reporting any sinus infection or bronchitis symptoms. Pt did take doxy I rx'd.  Pt states did use nicotine patch. She is presenty smoking about 1.5 cigarettes a day.   Pt states she started zoloft for depression. She used one tablet and she states felt like a zombie so she stopped.  Pt is still getting ha on daily basis. She states ha for 2 years. I advised tylenol and rx'd flexeril. She states flexeril did not stop ha. She states every day with ha. She also states occasional blurred vision.(20 years ago go mri and study was negative).   Review of Systems  Constitutional: Negative for chills, fatigue and fever.  HENT: Negative for congestion, postnasal drip and sinus pressure.   Respiratory: Negative for cough, chest tightness, shortness of breath and wheezing.   Cardiovascular: Negative for chest pain and palpitations.  Gastrointestinal: Negative for abdominal pain.  Musculoskeletal: Negative for back pain.  Neurological: Positive for headaches. Negative for dizziness, speech difficulty, weakness and light-headedness.       Faint ha presenlty today. Took goody powder earlier.  Hematological: Negative for adenopathy. Does not bruise/bleed easily.  Psychiatric/Behavioral: Positive for dysphoric mood. Negative for agitation, self-injury and sleep disturbance. The patient is not nervous/anxious.        Mild mood decrease varies day to day.   Past Medical History:  Diagnosis Date  . COPD (chronic obstructive pulmonary disease) (HCC)   . Depression   . Head ache      Social History   Socioeconomic History  . Marital status: Widowed    Spouse name: Not on file  . Number of children: Not on file  . Years of  education: Not on file  . Highest education level: Not on file  Occupational History  . Not on file  Social Needs  . Financial resource strain: Not on file  . Food insecurity:    Worry: Not on file    Inability: Not on file  . Transportation needs:    Medical: Not on file    Non-medical: Not on file  Tobacco Use  . Smoking status: Current Every Day Smoker    Packs/day: 1.00    Types: Cigarettes  . Smokeless tobacco: Never Used  Substance and Sexual Activity  . Alcohol use: Yes    Comment: socially. moderate now.  years ago drank heavier on weekends.  . Drug use: No  . Sexual activity: Not Currently  Lifestyle  . Physical activity:    Days per week: Not on file    Minutes per session: Not on file  . Stress: Not on file  Relationships  . Social connections:    Talks on phone: Not on file    Gets together: Not on file    Attends religious service: Not on file    Active member of club or organization: Not on file    Attends meetings of clubs or organizations: Not on file    Relationship status: Not on file  . Intimate partner violence:    Fear of current or ex partner: Not on file    Emotionally abused: Not on file    Physically abused: Not on file  Forced sexual activity: Not on file  Other Topics Concern  . Not on file  Social History Narrative  . Not on file    Past Surgical History:  Procedure Laterality Date  . CARPAL TUNNEL RELEASE    . CESAREAN SECTION     1985  . CESAREAN SECTION     1987    Family History  Problem Relation Age of Onset  . Pancreatic cancer Mother   . Heart disease Father   . Heart attack Father     Allergies  Allergen Reactions  . Ibuprofen Swelling  . Amoxicillin Hives    Has patient had a PCN reaction causing immediate rash, facial/tongue/throat swelling, SOB or lightheadedness with hypotension: No Has patient had a PCN reaction causing severe rash involving mucus membranes or skin necrosis: No Has patient had a PCN  reaction that required hospitalization: No Has patient had a PCN reaction occurring within the last 10 years: No If all of the above answers are "NO", then may proceed with Cephalosporin use.\    Current Outpatient Medications on File Prior to Visit  Medication Sig Dispense Refill  . albuterol (PROVENTIL HFA;VENTOLIN HFA) 108 (90 Base) MCG/ACT inhaler Inhale 2 puffs into the lungs every 6 (six) hours as needed for wheezing or shortness of breath. 1 Inhaler 2  . budesonide-formoterol (SYMBICORT) 80-4.5 MCG/ACT inhaler Inhale 2 puffs into the lungs 2 (two) times daily. 1 Inhaler 3  . nicotine (NICODERM CQ - DOSED IN MG/24 HOURS) 21 mg/24hr patch Place 1 patch (21 mg total) onto the skin daily. 28 patch 0  . cyclobenzaprine (FLEXERIL) 5 MG tablet Take 1 tablet (5 mg total) by mouth at bedtime. (Patient not taking: Reported on 09/11/2018) 7 tablet 0  . sertraline (ZOLOFT) 25 MG tablet Take 1 tablet (25 mg total) by mouth daily. (Patient not taking: Reported on 09/11/2018) 30 tablet 0   No current facility-administered medications on file prior to visit.     BP 140/88   Pulse 76   Temp 98.6 F (37 C) (Oral)   Resp 16   Ht 5\' 4"  (1.626 m)   Wt 116 lb 9.6 oz (52.9 kg)   SpO2 100%   BMI 20.01 kg/m       Objective:   Physical Exam  General Mental Status- Alert. General Appearance- Not in acute distress.   Skin General: Color- Normal Color. Moisture- Normal Moisture.  Neck Carotid Arteries- Normal color. Moisture- Normal Moisture. No carotid bruits. No JVD.  Chest and Lung Exam Auscultation: Breath Sounds:-Normal.  Cardiovascular Auscultation:Rythm- Regular. Murmurs & Other Heart Sounds:Auscultation of the heart reveals- No Murmurs.  Abdomen Inspection:-Inspeection Normal. Palpation/Percussion:Note:No mass. Palpation and Percussion of the abdomen reveal- Non Tender, Non Distended + BS, no rebound or guarding.    Neurologic Cranial Nerve exam:- CN III-XII intact(No  nystagmus), symmetric smile. Strength:- 5/5 equal and symmetric strength both upper and lower extremities.      Assessment & Plan:  Your recent sinus infection and bronchitis appears to be resolved now.  For your history of smoking and COPD, continue with Symbicort daily and use albuterol as needed.  The pulmonology referral is pending.  For smoking cessation, continue with nicotine patches.  Continue to taper off of the cigarettes.  I am considering prescribing you Wellbutrin in the future but want to make sure that your blood pressure is well controlled before giving you that prescription.  Your depression is intermittently recently.  Not severe and you did not respond to a sertraline.  In the near future will try Wellbutrin once your blood pressure is a little bit better control.  For mild high blood pressure, I gave you amlodipine 2.5 mg dose.  Regarding chronic daily headache history, I decided to go ahead and put an order for CT of head without contrast.  It is possible that slight blood pressure elevation might be contributing to your daily headaches.  We will see with the blood pressure medications if headaches I decreased some.  Also want you to go ahead and schedule yourself appoint with optometrist your vision checked.  Want you to follow-up in 2 weeks for nurse blood pressure check.  Please get that scheduled on the way out.  If BP is closer to 130/80 then will likely send you an Wellbutrin prescription to help you stop smoking.  Follow-up date with me also will be dependent on blood pressure reading.

## 2018-09-20 ENCOUNTER — Other Ambulatory Visit: Payer: Self-pay | Admitting: Medical

## 2018-09-25 ENCOUNTER — Ambulatory Visit (INDEPENDENT_AMBULATORY_CARE_PROVIDER_SITE_OTHER): Payer: 59 | Admitting: Family Medicine

## 2018-09-25 DIAGNOSIS — I10 Essential (primary) hypertension: Secondary | ICD-10-CM

## 2018-09-25 MED ORDER — AMLODIPINE BESYLATE 5 MG PO TABS: 5.0000 mg | ORAL_TABLET | Freq: Every day | ORAL | 0 refills | Status: DC

## 2018-09-25 NOTE — Progress Notes (Addendum)
Pre visit review using our clinic review tool, if applicable. No additional management support is needed unless otherwise documented below in the visit note.  Patient here for blood pressure check per [name]. Patient on 2.5 mg amlodipine once daily.   BP Readings from Last 3 Encounters:  09/11/18 140/88  08/26/18 (!) 132/96  08/21/18 (!) 132/91   BP today =137/86 HR=80  Per Dr. Abner Greenspan doc of the day Increase to 5 mg come back for check 2-3 weeks. Patient would like to call back to schedule re-check after her appointment with pulmonology.    Routed to Dr. Abner Greenspan in absence of PCP.  Nursing blood pressure check note reviewed. Agree with documention and plan.

## 2018-10-04 ENCOUNTER — Other Ambulatory Visit: Payer: Self-pay | Admitting: Medical

## 2018-10-10 ENCOUNTER — Encounter: Payer: Self-pay | Admitting: Pulmonary Disease

## 2018-10-10 ENCOUNTER — Ambulatory Visit: Payer: 59 | Admitting: Pulmonary Disease

## 2018-10-10 VITALS — BP 126/74 | HR 74 | Ht 64.0 in | Wt 114.0 lb

## 2018-10-10 DIAGNOSIS — J441 Chronic obstructive pulmonary disease with (acute) exacerbation: Secondary | ICD-10-CM | POA: Diagnosis not present

## 2018-10-10 DIAGNOSIS — R911 Solitary pulmonary nodule: Secondary | ICD-10-CM | POA: Diagnosis not present

## 2018-10-10 DIAGNOSIS — R918 Other nonspecific abnormal finding of lung field: Secondary | ICD-10-CM

## 2018-10-10 DIAGNOSIS — Z720 Tobacco use: Secondary | ICD-10-CM

## 2018-10-10 HISTORY — DX: Other nonspecific abnormal finding of lung field: R91.8

## 2018-10-10 MED ORDER — UMECLIDINIUM-VILANTEROL 62.5-25 MCG/INH IN AEPB: 1.0000 | INHALATION_SPRAY | Freq: Every day | RESPIRATORY_TRACT | 0 refills | Status: DC

## 2018-10-10 MED ORDER — UMECLIDINIUM-VILANTEROL 62.5-25 MCG/INH IN AEPB: 1.0000 | INHALATION_SPRAY | Freq: Every day | RESPIRATORY_TRACT | 3 refills | Status: DC

## 2018-10-10 NOTE — Progress Notes (Signed)
Subjective:    Patient ID: Erin Sloan, female    DOB: Sep 16, 1960, 58 y.o.   MRN: [id number]  HPI  Chief Complaint  Patient presents with  . Pulm Consult    Referred by PCP to establish with a pulm doctor for history of COPD.    58 year old smoker presents to establish care for COPD. She has smoked more than 60 pack years, was smoking up to 2 packs/day but has more recently after hospitalization cut down to about half to 1 pack/day.  She is accompanied by her daughter today. She works as a Location manager.  She was hospitalized 08/2018 for 2 days for COPD exacerbation that started with URI symptoms and then proceeded to a chest cold.  She was treated with azithromycin, steroids and oxygen.  On discharge she was given an albuterol MDI.  On follow-up with her PCP she was given Symbicort which initially seemed to help but now the effect has plateaued and she wonders if she needs to keep taking this since she has a high co-pay.  She reports intermittent wheezing even when she is well.  She reports dyspnea on walking 1 flight of stairs or walking around the store wheeling the shopping cart.  Daughter reports that she has been not feeling well all of last year although she denies recurrent bronchitis. She denies nocturnal wheezing but has an early morning cough.  We reviewed spirometry and CT scan today She has decrease smoking using nicotine patches  Significant tests/ events reviewed  Spirometry 09/2018 ratio 71, FEV1 78%, FVC 84%  CT chest angiogram 08/2018 moderate emphysema, 2 to 3 mm nodules   Past Medical History:  Diagnosis Date  . COPD (chronic obstructive pulmonary disease) (HCC)   . Depression   . Head ache    Past Surgical History:  Procedure Laterality Date  . CARPAL TUNNEL RELEASE    . CESAREAN SECTION     1985  . CESAREAN SECTION     1987    Allergies  Allergen Reactions  . Ibuprofen Swelling  . Amoxicillin Hives    Has patient had a PCN reaction causing  immediate rash, facial/tongue/throat swelling, SOB or lightheadedness with hypotension: No Has patient had a PCN reaction causing severe rash involving mucus membranes or skin necrosis: No Has patient had a PCN reaction that required hospitalization: No Has patient had a PCN reaction occurring within the last 10 years: No If all of the above answers are "NO", then may proceed with Cephalosporin use.\    Social History   Socioeconomic History  . Marital status: Widowed    Spouse name: Not on file  . Number of children: Not on file  . Years of education: Not on file  . Highest education level: Not on file  Occupational History  . Not on file  Social Needs  . Financial resource strain: Not on file  . Food insecurity:    Worry: Not on file    Inability: Not on file  . Transportation needs:    Medical: Not on file    Non-medical: Not on file  Tobacco Use  . Smoking status: Current Every Day Smoker    Packs/day: 1.00    Types: Cigarettes  . Smokeless tobacco: Never Used  Substance and Sexual Activity  . Alcohol use: Yes    Comment: socially. moderate now.  years ago drank heavier on weekends.  . Drug use: No  . Sexual activity: Not Currently  Lifestyle  . Physical activity:  Days per week: Not on file    Minutes per session: Not on file  . Stress: Not on file  Relationships  . Social connections:    Talks on phone: Not on file    Gets together: Not on file    Attends religious service: Not on file    Active member of club or organization: Not on file    Attends meetings of clubs or organizations: Not on file    Relationship status: Not on file  . Intimate partner violence:    Fear of current or ex partner: Not on file    Emotionally abused: Not on file    Physically abused: Not on file    Forced sexual activity: Not on file  Other Topics Concern  . Not on file  Social History Narrative  . Not on file     Family History  Problem Relation Age of Onset  .  Pancreatic cancer Mother   . Heart disease Father   . Heart attack Father       Review of Systems  Constitutional: Negative for fever and unexpected weight change.  HENT: Negative for congestion, dental problem, ear pain, nosebleeds, postnasal drip, rhinorrhea, sinus pressure, sneezing, sore throat and trouble swallowing.   Eyes: Negative for redness and itching.  Respiratory: Positive for cough and shortness of breath. Negative for chest tightness and wheezing.   Cardiovascular: Negative for palpitations and leg swelling.  Gastrointestinal: Negative for nausea and vomiting.  Genitourinary: Negative for dysuria.  Musculoskeletal: Negative for joint swelling.  Skin: Negative for rash.  Allergic/Immunologic: Negative.  Negative for environmental allergies, food allergies and immunocompromised state.  Neurological: Negative for headaches.  Hematological: Does not bruise/bleed easily.  Psychiatric/Behavioral: Negative for dysphoric mood. The patient is not nervous/anxious.        Objective:   Physical Exam  Gen. Pleasant, well-nourished, in no distress, normal affect ENT - no pallor,icterus, no post nasal drip Neck: No JVD, no thyromegaly, no carotid bruits Lungs: no use of accessory muscles, no dullness to percussion, decreased  without rales or rhonchi  Cardiovascular: Rhythm regular, heart sounds  normal, no murmurs or gallops, no peripheral edema Abdomen: soft and non-tender, no hepatosplenomegaly, BS normal. Musculoskeletal: No deformities, no cyanosis or clubbing Neuro:  alert, non focal       Assessment & Plan:

## 2018-10-10 NOTE — Patient Instructions (Signed)
You have COPD/chronic bronchitis and emphysema.  Smoking cessation is the most important intervention. If you quit smoking you may be able to come off medication Trial of ANORO once daily instead of Symbicort   CT chest without contrast in 1 year

## 2018-10-10 NOTE — Assessment & Plan Note (Signed)
Too small and likely inflammatory. Repeat CT chest without contrast in 1 year

## 2018-10-10 NOTE — Assessment & Plan Note (Signed)
Discuss smoking cessation. She has been able to cut down when nicotine patches  Can consider adding Wellbutrin or Chantix depending on what progress we make in 3 months

## 2018-10-10 NOTE — Assessment & Plan Note (Signed)
Appears to have been related to acute bronchitis which is now resolved. CT scan does show moderate emphysema. Surprisingly lung function is not bad with FEV1 at 78%.  But she is symptomatic hence we will Trial of ANORO once daily instead of Symbicort  Again emphasized smoking cessation is the most important intervention here and if she is able to achieve this, we may want to consider coming off medication. She will continue to use albuterol on an as-needed basis

## 2018-10-11 ENCOUNTER — Ambulatory Visit (HOSPITAL_BASED_OUTPATIENT_CLINIC_OR_DEPARTMENT_OTHER): Payer: 59

## 2018-10-19 ENCOUNTER — Other Ambulatory Visit: Payer: Self-pay | Admitting: Medical

## 2018-10-22 ENCOUNTER — Other Ambulatory Visit: Payer: Self-pay | Admitting: Medical

## 2018-11-01 ENCOUNTER — Other Ambulatory Visit: Payer: Self-pay | Admitting: Medical

## 2018-11-17 ENCOUNTER — Other Ambulatory Visit: Payer: Self-pay | Admitting: Medical

## 2018-11-25 ENCOUNTER — Other Ambulatory Visit: Payer: Self-pay | Admitting: Medical

## 2018-12-03 ENCOUNTER — Telehealth: Payer: Self-pay | Admitting: Pulmonary Disease

## 2018-12-03 NOTE — Telephone Encounter (Signed)
Called and spoke with patient regarding letter for being out of work Pt is worried being at work, she is in a warehouse with heavy machines Her work doesn't have nor allowing them to wear masks, and have no wipes to wipe down equipment Pt is concerned with her lung condition of being at work with [phone] Pt has baseline cough, using inhalers more at this time She has no fever, no chills, no cold sweats, no increase of SOB nor wheezing. She would like a letter to remain out of work for three weeks to be home quarantined.   RA please advise. Thank you

## 2018-12-04 ENCOUNTER — Encounter: Payer: Self-pay | Admitting: *Deleted

## 2018-12-04 NOTE — Telephone Encounter (Signed)
Pt is calling back [phone]

## 2018-12-04 NOTE — Telephone Encounter (Signed)
Okay to give generic letter saying that she is under our care for COPD.  She should follow social distancing guidelines and would be at high risk for deterioration if she gets a viral illness.  Please assist her in whatever manner possible

## 2018-12-04 NOTE — Telephone Encounter (Signed)
Letter has been written for pt. Called and spoke with pt letting her know this and asked if she wanted to come by office to pick letter up or if she wanted it to be placed in the mail and pt stated to place letter in mail.  I verified pt's address on file making sure it was correct and have placed letter in mail for pt. Nothing further needed.

## 2018-12-10 ENCOUNTER — Telehealth: Payer: Self-pay | Admitting: Adult Health

## 2018-12-10 NOTE — Telephone Encounter (Signed)
Called and spoke with patient, dates need to be on the letter. Letter printed again and fixed. Placed in outgoing mail. Nothing further needed.

## 2018-12-22 ENCOUNTER — Other Ambulatory Visit: Payer: Self-pay | Admitting: Medical

## 2018-12-23 ENCOUNTER — Telehealth: Payer: Self-pay | Admitting: Medical

## 2018-12-23 NOTE — Telephone Encounter (Deleted)
Will you call allergen pt assistance program and send in new rx of bystolic. She states has qualified for years. She is new to our office.   Will you call [phone].

## 2018-12-23 NOTE — Telephone Encounter (Signed)
Opened to review 

## 2018-12-23 NOTE — Telephone Encounter (Signed)
Disregard deleted note. Was on wrong pt.

## 2018-12-23 NOTE — Telephone Encounter (Signed)
Rx nicotine patch sent to pt pharmacy.

## 2018-12-24 ENCOUNTER — Telehealth: Payer: Self-pay | Admitting: Pulmonary Disease

## 2018-12-24 NOTE — Telephone Encounter (Signed)
Called and spoke w/ pt. Pt states she is supposed to go back to work on Monday 12/29/2018 after being out from 12/05/2018-12/26/2018. 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  States her job is taking precautions such as social distancing, sanitizing stations, and wiping down time clock, but no masks. Pt has a personal mask she wears to the grocery store.   Pt denies fever/chills/sweats. Denies SOB. Has had a mild cough, but no new or worsening symptoms from her baseline. Has not travelled within the last 30 days, however, states she's been around her daughter who has been around someone who tested positive for COVID-19.   RA, please advise whether this patient is able to go back to work or not. Thank you.

## 2018-12-26 NOTE — Telephone Encounter (Signed)
Called and spoke with pt letting her know the info from [facility]. Pt expressed understanding. Nothing further needed.

## 2018-12-26 NOTE — Telephone Encounter (Signed)
With Dr. Vassie Loll being over at the hospital due to COVID, routing to APP of day for them to see info from [facility], please advise on this for pt. Thanks!

## 2018-12-26 NOTE — Telephone Encounter (Signed)
She can return to work with safe precautions. Strict hand washing, maintaining social distancing/6 ft from others, mask if possible. There are some fabric masks on [employer] that can be washed after each use.

## 2019-01-08 ENCOUNTER — Ambulatory Visit: Payer: Self-pay | Admitting: Adult Health

## 2019-01-19 ENCOUNTER — Other Ambulatory Visit: Payer: Self-pay | Admitting: Medical

## 2019-02-26 ENCOUNTER — Other Ambulatory Visit: Payer: Self-pay | Admitting: Pulmonary Disease

## 2019-03-17 ENCOUNTER — Encounter: Payer: Self-pay | Admitting: Adult Health

## 2019-03-17 ENCOUNTER — Other Ambulatory Visit: Payer: Self-pay

## 2019-03-17 ENCOUNTER — Ambulatory Visit: Payer: 59 | Admitting: Adult Health

## 2019-03-17 DIAGNOSIS — R918 Other nonspecific abnormal finding of lung field: Secondary | ICD-10-CM

## 2019-03-17 DIAGNOSIS — J449 Chronic obstructive pulmonary disease, unspecified: Secondary | ICD-10-CM | POA: Diagnosis not present

## 2019-03-17 DIAGNOSIS — Z720 Tobacco use: Secondary | ICD-10-CM | POA: Diagnosis not present

## 2019-03-17 MED ORDER — ALBUTEROL SULFATE HFA 108 (90 BASE) MCG/ACT IN AERS: 2.0000 | INHALATION_SPRAY | Freq: Four times a day (QID) | RESPIRATORY_TRACT | 3 refills | Status: DC | PRN

## 2019-03-17 NOTE — Progress Notes (Signed)
@Patient  ID: Erin Sloan, female    DOB: 04-21-1961, 58 y.o.   MRN: [id number]  Chief Complaint  Patient presents with  . Follow-up    COPD     Referring provider: Elise Benne  HPI: 58 yo female active smoker followed for COPD and Lung nodules    TEST/EVENTS :  Spirometry 09/2018 ratio 71, FEV1 78%, FVC 84%  CT chest angiogram 08/2018 moderate emphysema, 2 to 3 mm nodules  03/17/2019 Follow up : COPD and Lung nodules  Patient returns for 6 month follow up . She has mild COPD with emphysema . She is still smoking , has cut back. Uses nicotine patches. Smoking cessation encouraged.  Works full time . Stays active  Says over breathing is doing okay with no flare of cough or wheezing  She was changed from symbicort to [facility] last visit . Says feels it does okay . Has chronic dyspnea intermittently with heavy activity .  No chest pain, edema . Appetite is fair no weight loss CT chest 08/2018 showed COPD , 2 to 3 mm nodules . Plans for repeat CT Chest in 08/2019 .      Work Licensed conveyancer active .  Nodules      Allergies  Allergen Reactions  . Ibuprofen Swelling  . Amoxicillin Hives    Has patient had a PCN reaction causing immediate rash, facial/tongue/throat swelling, SOB or lightheadedness with hypotension: No Has patient had a PCN reaction causing severe rash involving mucus membranes or skin necrosis: No Has patient had a PCN reaction that required hospitalization: No Has patient had a PCN reaction occurring within the last 10 years: No If all of the above answers are "NO", then may proceed with Cephalosporin use.\     There is no immunization history on file for this patient.  Past Medical History:  Diagnosis Date  . COPD (chronic obstructive pulmonary disease) ([facility])   . Depression   . Head ache     Tobacco History: Social History   Tobacco Use  Smoking Status Current Every Day Smoker  . Packs/day: 1.00  . Types:  Cigarettes  Smokeless Tobacco Never Used   Ready to quit: No Counseling given: Yes   Outpatient Medications Prior to Visit  Medication Sig Dispense Refill  . amLODipine (NORVASC) 2.5 MG tablet TAKE 1 TABLET BY MOUTH EVERY DAY 90 tablet 1  . amLODipine (NORVASC) 5 MG tablet TAKE 1 TABLET BY MOUTH EVERY DAY 30 tablet 0  . ANORO ELLIPTA 62.5-25 MCG/INH AEPB TAKE 1 PUFF BY MOUTH EVERY DAY 60 each 3  . cyclobenzaprine (FLEXERIL) 5 MG tablet Take 1 tablet (5 mg total) by mouth at bedtime. 7 tablet 0  . nicotine (NICODERM CQ - DOSED IN MG/24 HOURS) 21 mg/24hr patch PLACE 1 PATCH (21 MG TOTAL) ONTO THE SKIN DAILY. 28 patch 0  . albuterol (PROVENTIL HFA;VENTOLIN HFA) 108 (90 Base) MCG/ACT inhaler Inhale 2 puffs into the lungs every 6 (six) hours as needed for wheezing or shortness of breath. 1 Inhaler 2   No facility-administered medications prior to visit.      Review of Systems:   Constitutional:   No  weight loss, night sweats,  Fevers, chills, fatigue, or  lassitude.  HEENT:   No headaches,  Difficulty swallowing,  Tooth/dental problems, or  Sore throat,                No sneezing, itching, ear ache, nasal congestion, post nasal drip,  CV:  No chest pain,  Orthopnea, PND, swelling in lower extremities, anasarca, dizziness, palpitations, syncope.   GI  No heartburn, indigestion, abdominal pain, nausea, vomiting, diarrhea, change in bowel habits, loss of appetite, bloody stools.   Resp:   No excess mucus, no productive cough,  No non-productive cough,  No coughing up of blood.  No change in color of mucus.  No wheezing.  No chest wall deformity  Skin: no rash or lesions.  GU: no dysuria, change in color of urine, no urgency or frequency.  No flank pain, no hematuria   MS:  No joint pain or swelling.  No decreased range of motion.  No back pain.    Physical Exam  BP 136/80 (BP Location: Left Arm, Cuff Size: Normal)   Pulse 91   Temp 98.4 F (36.9 C) (Oral)   Ht 5\' 4"  (1.626 m)    Wt 115 lb 3.2 oz (52.3 kg)   SpO2 100%   BMI 19.77 kg/m   GEN: A/Ox3; pleasant , NAD,    HEENT:  [place]/AT,  EACs-clear, TMs-wnl, NOSE-clear, THROAT-clear, no lesions, no postnasal drip or exudate noted.   NECK:  Supple w/ fair ROM; no JVD; normal carotid impulses w/o bruits; no thyromegaly or nodules palpated; no lymphadenopathy.    RESP  Clear  P & A; w/o, wheezes/ rales/ or rhonchi. no accessory muscle use, no dullness to percussion  CARD:  RRR, no m/r/g, no peripheral edema, pulses intact, no cyanosis or clubbing.  GI:   Soft & nt; nml bowel sounds; no organomegaly or masses detected.   Musco: Warm bil, no deformities or joint swelling noted.   Neuro: alert, no focal deficits noted.    Skin: Warm, no lesions or rashes    Lab Results:  CBC    BNP No results found for: BNP  ProBNP No results found for: PROBNP  Imaging: No results found.    No flowsheet data found.  No results found for: NITRICOXIDE      Assessment & Plan:   COPD (chronic obstructive pulmonary disease) (HCC) Appears stable on ANORO  Smoking cessation is key   Plan  Patient Instructions  Continue on ANORO 1 puff daily , rinse after use.  Work on quitting smoking .  Plan on CT chest without contrast in 08/2019 .  Follow up in 6 months with Dr. Vassie LollAlva  Or Ugo Thoma NP and As needed   Please contact office for sooner follow up if symptoms do not improve or worsen or seek emergency care       Pulmonary nodules Very small pulmonary nodules noted on CT chest 08/2018  Need follow up CT chest 08/2019 .       Rubye Oaksammy Shalom Mcguiness, NP 03/17/2019

## 2019-03-17 NOTE — Assessment & Plan Note (Signed)
Smoking cessation discussed 

## 2019-03-17 NOTE — Patient Instructions (Addendum)
Continue on ANORO 1 puff daily , rinse after use.  Work on quitting smoking .  Plan on CT chest without contrast in 08/2019 .  Follow up in 6 months with Dr. Elsworth Soho  Or Laylaa Guevarra NP and As needed   Please contact office for sooner follow up if symptoms do not improve or worsen or seek emergency care

## 2019-03-17 NOTE — Assessment & Plan Note (Signed)
Appears stable on ANORO  Smoking cessation is key   Plan  Patient Instructions  Continue on ANORO 1 puff daily , rinse after use.  Work on quitting smoking .  Plan on CT chest without contrast in 08/2019 .  Follow up in 6 months with Dr. Elsworth Soho  Or Parrett NP and As needed   Please contact office for sooner follow up if symptoms do not improve or worsen or seek emergency care

## 2019-03-17 NOTE — Assessment & Plan Note (Signed)
Very small pulmonary nodules noted on CT chest 08/2018  Need follow up CT chest 08/2019 .

## 2019-07-06 ENCOUNTER — Telehealth: Payer: Self-pay

## 2019-07-06 NOTE — Telephone Encounter (Signed)
Spoke with pt. Pt states she got tested at [facility] today will give [place] a call back if she gets worse and need an appointment.

## 2019-07-06 NOTE — Telephone Encounter (Signed)
Copied from [place] [id number]. Topic: Appointment Scheduling - Prior Auth Required for Appointment >> Jul 06, 2019  7:54 AM Robina Ade, Helene Kelp D wrote: No appointment has been scheduled. Patient is requesting same day appointment for covid like symptoms.  Per scheduling protocol, this appointment requires a prior authorization prior to scheduling.  Route to department's PEC pool.

## 2019-09-17 ENCOUNTER — Ambulatory Visit: Payer: 59 | Admitting: Adult Health

## 2019-10-08 ENCOUNTER — Other Ambulatory Visit: Payer: Self-pay

## 2019-10-08 ENCOUNTER — Ambulatory Visit (INDEPENDENT_AMBULATORY_CARE_PROVIDER_SITE_OTHER): Payer: 59

## 2019-10-08 DIAGNOSIS — R911 Solitary pulmonary nodule: Secondary | ICD-10-CM

## 2019-10-12 ENCOUNTER — Ambulatory Visit: Payer: 59 | Admitting: Adult Health

## 2019-10-12 ENCOUNTER — Encounter: Payer: Self-pay | Admitting: Adult Health

## 2019-10-12 ENCOUNTER — Other Ambulatory Visit: Payer: Self-pay

## 2019-10-12 VITALS — BP 150/76 | HR 89 | Temp 97.1°F | Ht 64.0 in | Wt 117.8 lb

## 2019-10-12 DIAGNOSIS — J449 Chronic obstructive pulmonary disease, unspecified: Secondary | ICD-10-CM

## 2019-10-12 DIAGNOSIS — R918 Other nonspecific abnormal finding of lung field: Secondary | ICD-10-CM

## 2019-10-12 DIAGNOSIS — Z23 Encounter for immunization: Secondary | ICD-10-CM

## 2019-10-12 DIAGNOSIS — Z720 Tobacco use: Secondary | ICD-10-CM

## 2019-10-12 MED ORDER — ANORO ELLIPTA 62.5-25 MCG/INH IN AEPB: INHALATION_SPRAY | RESPIRATORY_TRACT | 3 refills | Status: DC

## 2019-10-12 NOTE — Patient Instructions (Addendum)
Continue on ANORO 1 puff daily , rinse after use.  Work on quitting smoking .  Refer to Kandice Robinsons for low-dose CT screening program Flu shot today.  Consider Covid Vaccine when available for your age category.  Follow up in 6 months with Dr. Vassie Loll  Or Sherol Sabas NP and As needed   Please contact office for sooner follow up if symptoms do not improve or worsen or seek emergency care

## 2019-10-12 NOTE — Assessment & Plan Note (Signed)
Controlled on current regimen  Smoking cessation discussed .   Plan  Patient Instructions  Continue on ANORO 1 puff daily , rinse after use.  Work on quitting smoking .  Refer to Kandice Robinsons for low-dose CT screening program Flu shot today.  Consider Covid Vaccine when available for your age category.  Follow up in 6 months with Dr. Vassie Loll  Or Yanice Maqueda NP and As needed   Please contact office for sooner follow up if symptoms do not improve or worsen or seek emergency care

## 2019-10-12 NOTE — Assessment & Plan Note (Signed)
Smoking cessation  

## 2019-10-12 NOTE — Progress Notes (Signed)
@Patient  ID: , female    DOB: 07-06-1961, 59 y.o.   MRN: [id number]  Chief Complaint  Patient presents with  . Follow-up    COPD     Referring provider: [id number]  HPI: 59 year old female active smoker followed for COPD and lung nodules  TEST/EVENTS :  Spirometry 09/2018 ratio 71, FEV1 78%, FVC 84%  CT chest angiogram 08/2018 moderate emphysema,2 to 3 mm nodules  10/12/2019  Follow up : COPD and Lung Nodules  Patient presents for a 14-month follow-up.  Patient has underlying mild COPD with emphysema.  She continues to smoke.  Smoking cessation was discussed. She remains on Anoro daily. Patient says overall breathing is doing okay without flare of cough or wheezing . Gets winded with heavy activity . Does feel dyspnea if she overeats . No chest pain, orthopnea, fever, edema or n/v/d . No hemoptysis or weight loss.   Patient has known lung nodules on CT chest.  CT chest December 2019 showed 2 to 3 mm nodules.  CT chest on October 08, 2019 showed emphysema.  1-2 mm right upper lobe nodule peripherally stable.   Working a lot . Working 60hr a week.     Allergies  Allergen Reactions  . Ibuprofen Swelling  . Amoxicillin Hives    Has patient had a PCN reaction causing immediate rash, facial/tongue/throat swelling, SOB or lightheadedness with hypotension: No Has patient had a PCN reaction causing severe rash involving mucus membranes or skin necrosis: No Has patient had a PCN reaction that required hospitalization: No Has patient had a PCN reaction occurring within the last 10 years: No If all of the above answers are "NO", then may proceed with Cephalosporin use.\    Immunization History  Administered Date(s) Administered  . Influenza,inj,Quad PF,6+ Mos 10/12/2019    Past Medical History:  Diagnosis Date  . COPD (chronic obstructive pulmonary disease) (HCC)   . Depression   . Head ache     Tobacco History: Social History   Tobacco Use    Smoking Status Current Every Day Smoker  . Packs/day: 1.50  . Years: 48.00  . Pack years: 72.00  . Types: Cigarettes  Smokeless Tobacco Never Used   Ready to quit: No Counseling given: No   Outpatient Medications Prior to Visit  Medication Sig Dispense Refill  . albuterol (VENTOLIN HFA) 108 (90 Base) MCG/ACT inhaler Inhale 2 puffs into the lungs every 6 (six) hours as needed for wheezing or shortness of breath. 6.7 g 3  . amLODipine (NORVASC) 2.5 MG tablet TAKE 1 TABLET BY MOUTH EVERY DAY 90 tablet 1  . amLODipine (NORVASC) 5 MG tablet TAKE 1 TABLET BY MOUTH EVERY DAY 30 tablet 0  . ANORO ELLIPTA 62.5-25 MCG/INH AEPB TAKE 1 PUFF BY MOUTH EVERY DAY 60 each 3  . cyclobenzaprine (FLEXERIL) 5 MG tablet Take 1 tablet (5 mg total) by mouth at bedtime. 7 tablet 0  . nicotine (NICODERM CQ - DOSED IN MG/24 HOURS) 21 mg/24hr patch PLACE 1 PATCH (21 MG TOTAL) ONTO THE SKIN DAILY. (Patient not taking: Reported on 10/12/2019) 28 patch 0   No facility-administered medications prior to visit.     Review of Systems:   Constitutional:   No  weight loss, night sweats,  Fevers, chills, fatigue, or  lassitude.  HEENT:   No headaches,  Difficulty swallowing,  Tooth/dental problems, or  Sore throat,                No sneezing,  itching, ear ache, nasal congestion, post nasal drip,   CV:  No chest pain,  Orthopnea, PND, swelling in lower extremities, anasarca, dizziness, palpitations, syncope.   GI  No heartburn, indigestion, abdominal pain, nausea, vomiting, diarrhea, change in bowel habits, loss of appetite, bloody stools.   Resp:    No excess mucus, no productive cough,  No non-productive cough,  No coughing up of blood.  No change in color of mucus.  No wheezing.  No chest wall deformity  Skin: no rash or lesions.  GU: no dysuria, change in color of urine, no urgency or frequency.  No flank pain, no hematuria   MS:  No joint pain or swelling.  No decreased range of motion.  No back  pain.    Physical Exam  BP (!) 150/76 (BP Location: Left Arm, Cuff Size: Normal)   Pulse 89   Temp (!) 97.1 F (36.2 C) (Temporal)   Ht 5\' 4"  (1.626 m)   Wt 117 lb 12.8 oz (53.4 kg)   SpO2 98% Comment: RA  BMI 20.22 kg/m   GEN: A/Ox3; pleasant , NAD, thin    HEENT:  [place]/AT,    NOSE-clear, THROAT-clear, no lesions, no postnasal drip or exudate noted.   NECK:  Supple w/ fair ROM; no JVD; normal carotid impulses w/o bruits; no thyromegaly or nodules palpated; no lymphadenopathy.    RESP  Clear  P & A; w/o, wheezes/ rales/ or rhonchi. no accessory muscle use, no dullness to percussion  CARD:  RRR, no m/r/g, no peripheral edema, pulses intact, no cyanosis or clubbing.  GI:   Soft & nt; nml bowel sounds; no organomegaly or masses detected.   Musco: Warm bil, no deformities or joint swelling noted.   Neuro: alert, no focal deficits noted.    Skin: Warm, no lesions or rashes    Lab Results:  CBC  BMET  BNP No results found for: BNP  ProBNP No results found for: PROBNP  Imaging: CT Chest Wo Contrast  Result Date: 10/08/2019 CLINICAL DATA:  Follow up of small pulmonary nodules. EXAM: CT CHEST WITHOUT CONTRAST TECHNIQUE: Multidetector CT imaging of the chest was performed following the standard protocol without IV contrast. COMPARISON:  08/19/2018 CT chest FINDINGS: Cardiovascular: Coronary, aortic arch, and branch vessel atherosclerotic vascular disease. Mediastinum/Nodes: Unremarkable Lungs/Pleura: Centrilobular emphysema. Airway thickening is present, suggesting bronchitis or reactive airways disease. 1-2 mm right upper lobe nodule peripherally on image 58/3, stable. No worrisome nodule identified. Upper Abdomen: Unremarkable Musculoskeletal: Thoracic spondylosis. IMPRESSION: 1. Airway thickening is present, suggesting bronchitis or reactive airways disease. 2. Coronary, aortic arch, and branch vessel atherosclerotic vascular disease. 3. No worrisome lung nodules. Aortic  Atherosclerosis (ICD10-I70.0) and Emphysema (ICD10-J43.9). Electronically Signed   By: Van Clines M.D.   On: 10/08/2019 16:04      No flowsheet data found.  No results found for: NITRICOXIDE      Assessment & Plan:   COPD (chronic obstructive pulmonary disease) ([place]) Controlled on current regimen  Smoking cessation discussed .   Plan  Patient Instructions  Continue on ANORO 1 puff daily , rinse after use.  Work on quitting smoking .  Refer to Eric Form for low-dose CT screening program Flu shot today.  Consider Covid Vaccine when available for your age category.  Follow up in 6 months with Dr. Elsworth Soho  Or Hamdi Kley NP and As needed   Please contact office for sooner follow up if symptoms do not improve or worsen or seek emergency care  Tobacco abuse Smoking cessation    Pulmonary nodules Nodules on CT chest are stable . Dating back to 2019 . Will refer to LDCT screening program . Meets criteria      Rubye Oaks, NP 10/12/2019

## 2019-10-12 NOTE — Assessment & Plan Note (Signed)
Nodules on CT chest are stable . Dating back to 2019 . Will refer to LDCT screening program . Meets criteria

## 2019-10-15 ENCOUNTER — Telehealth: Payer: Self-pay | Admitting: Adult Health

## 2019-10-15 NOTE — Telephone Encounter (Signed)
Spoke with pt regarding lung cancer screening. Pt advised that we will contact her closer to 09/2020 which will be one year from most recent chest ct. Nothing further needed at this time.

## 2019-11-18 ENCOUNTER — Ambulatory Visit: Payer: 59 | Admitting: Medical

## 2019-11-18 ENCOUNTER — Inpatient Hospital Stay (HOSPITAL_COMMUNITY): Payer: 59

## 2019-11-18 ENCOUNTER — Inpatient Hospital Stay (HOSPITAL_BASED_OUTPATIENT_CLINIC_OR_DEPARTMENT_OTHER)
Admission: EM | Admit: 2019-11-18 | Discharge: 2019-11-19 | DRG: 066 | Disposition: A | Discharge status: Home / Self Care | Payer: 59 | Attending: Internal Medicine | Admitting: Internal Medicine

## 2019-11-18 ENCOUNTER — Encounter (HOSPITAL_BASED_OUTPATIENT_CLINIC_OR_DEPARTMENT_OTHER): Payer: Self-pay | Admitting: Emergency Medicine

## 2019-11-18 ENCOUNTER — Emergency Department (HOSPITAL_BASED_OUTPATIENT_CLINIC_OR_DEPARTMENT_OTHER): Payer: 59

## 2019-11-18 ENCOUNTER — Other Ambulatory Visit: Payer: Self-pay

## 2019-11-18 VITALS — BP 151/74 | HR 71 | Temp 97.3°F | Resp 12 | Ht 64.0 in

## 2019-11-18 DIAGNOSIS — F17210 Nicotine dependence, cigarettes, uncomplicated: Secondary | ICD-10-CM | POA: Diagnosis present

## 2019-11-18 DIAGNOSIS — F329 Major depressive disorder, single episode, unspecified: Secondary | ICD-10-CM | POA: Diagnosis present

## 2019-11-18 DIAGNOSIS — I6389 Other cerebral infarction: Secondary | ICD-10-CM | POA: Diagnosis not present

## 2019-11-18 DIAGNOSIS — Z79899 Other long term (current) drug therapy: Secondary | ICD-10-CM | POA: Diagnosis not present

## 2019-11-18 DIAGNOSIS — R531 Weakness: Secondary | ICD-10-CM | POA: Diagnosis present

## 2019-11-18 DIAGNOSIS — Z800 Family history of malignant neoplasm of digestive organs: Secondary | ICD-10-CM

## 2019-11-18 DIAGNOSIS — I639 Cerebral infarction, unspecified: Secondary | ICD-10-CM | POA: Diagnosis present

## 2019-11-18 DIAGNOSIS — J449 Chronic obstructive pulmonary disease, unspecified: Secondary | ICD-10-CM | POA: Diagnosis not present

## 2019-11-18 DIAGNOSIS — I63332 Cerebral infarction due to thrombosis of left posterior cerebral artery: Secondary | ICD-10-CM

## 2019-11-18 DIAGNOSIS — Z8249 Family history of ischemic heart disease and other diseases of the circulatory system: Secondary | ICD-10-CM

## 2019-11-18 DIAGNOSIS — Z720 Tobacco use: Secondary | ICD-10-CM | POA: Diagnosis not present

## 2019-11-18 DIAGNOSIS — R200 Anesthesia of skin: Secondary | ICD-10-CM | POA: Diagnosis not present

## 2019-11-18 DIAGNOSIS — I10 Essential (primary) hypertension: Secondary | ICD-10-CM | POA: Diagnosis not present

## 2019-11-18 DIAGNOSIS — R202 Paresthesia of skin: Secondary | ICD-10-CM

## 2019-11-18 DIAGNOSIS — K219 Gastro-esophageal reflux disease without esophagitis: Secondary | ICD-10-CM

## 2019-11-18 DIAGNOSIS — Z20822 Contact with and (suspected) exposure to covid-19: Secondary | ICD-10-CM | POA: Diagnosis present

## 2019-11-18 DIAGNOSIS — I6523 Occlusion and stenosis of bilateral carotid arteries: Secondary | ICD-10-CM | POA: Diagnosis present

## 2019-11-18 DIAGNOSIS — I6310 Cerebral infarction due to embolism of unspecified precerebral artery: Secondary | ICD-10-CM

## 2019-11-18 DIAGNOSIS — R269 Unspecified abnormalities of gait and mobility: Secondary | ICD-10-CM | POA: Diagnosis not present

## 2019-11-18 DIAGNOSIS — I739 Peripheral vascular disease, unspecified: Secondary | ICD-10-CM | POA: Diagnosis present

## 2019-11-18 HISTORY — DX: Cerebral infarction, unspecified: I63.9

## 2019-11-18 LAB — COMPREHENSIVE METABOLIC PANEL
ALT: 14 U/L (ref 0–44)
AST: 13 U/L — ABNORMAL LOW (ref 15–41)
Albumin: 4.2 g/dL (ref 3.5–5.0)
Alkaline Phosphatase: 72 U/L (ref 38–126)
Anion gap: 10 (ref 5–15)
BUN: 14 mg/dL (ref 6–20)
CO2: 26 mmol/L (ref 22–32)
Calcium: 9.2 mg/dL (ref 8.9–10.3)
Chloride: 101 mmol/L (ref 98–111)
Creatinine, Ser: 0.56 mg/dL (ref 0.44–1.00)
GFR calc Af Amer: >60 mL/min (ref >60)
GFR calc non Af Amer: >60 mL/min (ref >60)
Glucose, Bld: 103 mg/dL — ABNORMAL HIGH (ref 70–99)
Potassium: 4 mmol/L (ref 3.5–5.1)
Sodium: 137 mmol/L (ref 135–145)
Total Bilirubin: 0.5 mg/dL (ref 0.3–1.2)
Total Protein: 7.4 g/dL (ref 6.5–8.1)

## 2019-11-18 LAB — DIFFERENTIAL
Abs Immature Granulocytes: 0.02 10*3/uL (ref 0.00–0.07)
Basophils Absolute: 0 10*3/uL (ref 0.0–0.1)
Basophils Relative: 1 %
Eosinophils Absolute: 0.1 10*3/uL (ref 0.0–0.5)
Eosinophils Relative: 2 %
Immature Granulocytes: 0 %
Lymphocytes Relative: 24 %
Lymphs Abs: 1.2 10*3/uL (ref 0.7–4.0)
Monocytes Absolute: 0.9 10*3/uL (ref 0.1–1.0)
Monocytes Relative: 17 %
Neutro Abs: 2.9 10*3/uL (ref 1.7–7.7)
Neutrophils Relative %: 56 %

## 2019-11-18 LAB — RAPID URINE DRUG SCREEN, HOSP PERFORMED
Amphetamines: NOT DETECTED
Barbiturates: NOT DETECTED
Benzodiazepines: NOT DETECTED
Cocaine: NOT DETECTED
Opiates: NOT DETECTED
Tetrahydrocannabinol: NOT DETECTED

## 2019-11-18 LAB — CBC
HCT: 44.1 % (ref 36.0–46.0)
Hemoglobin: 15.1 g/dL — ABNORMAL HIGH (ref 12.0–15.0)
MCH: 35.7 pg — ABNORMAL HIGH (ref 26.0–34.0)
MCHC: 34.2 g/dL (ref 30.0–36.0)
MCV: 104.3 fL — ABNORMAL HIGH (ref 80.0–100.0)
Platelets: 228 10*3/uL (ref 150–400)
RBC: 4.23 MIL/uL (ref 3.87–5.11)
RDW: 12.6 % (ref 11.5–15.5)
WBC: 5.1 10*3/uL (ref 4.0–10.5)
nRBC: 0 % (ref 0.0–0.2)

## 2019-11-18 LAB — PROTIME-INR
INR: 0.9 (ref 0.8–1.2)
Prothrombin Time: 12 seconds (ref 11.4–15.2)

## 2019-11-18 LAB — URINALYSIS, ROUTINE W REFLEX MICROSCOPIC
Bilirubin Urine: NEGATIVE
Glucose, UA: NEGATIVE mg/dL
Hgb urine dipstick: NEGATIVE
Ketones, ur: NEGATIVE mg/dL
Leukocytes,Ua: NEGATIVE
Nitrite: NEGATIVE
Protein, ur: NEGATIVE mg/dL
Specific Gravity, Urine: 1.01 (ref 1.005–1.030)
pH: 6 (ref 5.0–8.0)

## 2019-11-18 LAB — APTT: aPTT: 32 seconds (ref 24–36)

## 2019-11-18 LAB — SARS CORONAVIRUS 2 (TAT 6-24 HRS): SARS Coronavirus 2: NEGATIVE

## 2019-11-18 LAB — ETHANOL: Alcohol, Ethyl (B): <10 mg/dL (ref <10)

## 2019-11-18 LAB — PREGNANCY, URINE: Preg Test, Ur: NEGATIVE

## 2019-11-18 LAB — CBG MONITORING, ED: Glucose-Capillary: 108 mg/dL — ABNORMAL HIGH (ref 70–99)

## 2019-11-18 MED ORDER — WHITE PETROLATUM EX OINT
TOPICAL_OINTMENT | CUTANEOUS | Status: AC
  Administered 2019-11-18: 0.2
  Filled 2019-11-18: qty 28.35

## 2019-11-18 MED ORDER — ENOXAPARIN SODIUM 40 MG/0.4ML ~~LOC~~ SOLN
40.0000 mg | SUBCUTANEOUS | Status: DC
  Administered 2019-11-18: 40 mg via SUBCUTANEOUS
  Filled 2019-11-18 (×2): qty 0.4

## 2019-11-18 MED ORDER — NICOTINE 21 MG/24HR TD PT24
21.0000 mg | MEDICATED_PATCH | Freq: Every day | TRANSDERMAL | Status: DC
  Administered 2019-11-18 – 2019-11-19 (×2): 21 mg via TRANSDERMAL
  Filled 2019-11-18 (×2): qty 1

## 2019-11-18 MED ORDER — IPRATROPIUM-ALBUTEROL 0.5-2.5 (3) MG/3ML IN SOLN: 3.0000 mL | Freq: Four times a day (QID) | RESPIRATORY_TRACT | Status: DC | PRN

## 2019-11-18 MED ORDER — ASPIRIN EC 325 MG PO TBEC: 325.0000 mg | DELAYED_RELEASE_TABLET | Freq: Every day | ORAL | Status: DC

## 2019-11-18 MED ORDER — ATORVASTATIN CALCIUM 80 MG PO TABS
80.0000 mg | ORAL_TABLET | Freq: Every day | ORAL | Status: DC
  Administered 2019-11-18 – 2019-11-19 (×2): 80 mg via ORAL
  Filled 2019-11-18 (×2): qty 1

## 2019-11-18 MED ORDER — SODIUM CHLORIDE 0.9 % IV SOLN
100.0000 mL/h | INTRAVENOUS | Status: DC
  Administered 2019-11-18: 100 mL/h via INTRAVENOUS

## 2019-11-18 MED ORDER — ACETAMINOPHEN 325 MG PO TABS
650.0000 mg | ORAL_TABLET | ORAL | Status: DC | PRN
  Administered 2019-11-18 – 2019-11-19 (×3): 650 mg via ORAL
  Filled 2019-11-18 (×3): qty 2

## 2019-11-18 MED ORDER — STROKE: EARLY STAGES OF RECOVERY BOOK
Freq: Once | Status: AC
  Filled 2019-11-18: qty 1

## 2019-11-18 MED ORDER — ACETAMINOPHEN 650 MG RE SUPP: 650.0000 mg | RECTAL | Status: DC | PRN

## 2019-11-18 MED ORDER — IOHEXOL 350 MG/ML SOLN
100.0000 mL | Freq: Once | INTRAVENOUS | Status: AC | PRN
  Administered 2019-11-18: 100 mL via INTRAVENOUS

## 2019-11-18 MED ORDER — ACETAMINOPHEN 160 MG/5ML PO SOLN: 650.0000 mg | ORAL | Status: DC | PRN

## 2019-11-18 MED ORDER — SODIUM CHLORIDE 0.9 % IV BOLUS
500.0000 mL | Freq: Once | INTRAVENOUS | Status: AC
  Administered 2019-11-18: 500 mL via INTRAVENOUS

## 2019-11-18 MED ORDER — OMEPRAZOLE 40 MG PO CPDR: 40.0000 mg | DELAYED_RELEASE_CAPSULE | Freq: Every day | ORAL | 3 refills | Status: DC

## 2019-11-18 NOTE — H&P (Signed)
History and Physical    Erin Sloan ZOX:[id number] DOB: [birth year] DOA: 11/18/2019  PCP: [name], PA-C Patient coming from: PCPs office  Chief Complaint: Right-sided weakness and numbness  HPI: Erin Sloan is a 59 y.o. female with medical history significant of COPD, tobacco use, depression presenting to the ED from her PCPs office for evaluation of right-sided weakness and numbness.  Patient states yesterday evening 3/9 around 1915 she started experiencing numbness of her lip and cheek on the right side.  She then woke up around 2 in the morning and noticed that her right arm was numb and weak.  She went back to sleep and in the morning when she went to work she noticed that her right leg was numb.  She was able to walk without any difficulty but did have difficulty using her right arm.  She did not have any slurring of speech or drooping of her face.  Denies history of prior strokes.  She has been smoking cigarettes since the age of 47, previously up to 2 packs a day but has now cut down to 1 pack a day.  She took [place] powder (aspirin 520 mg) at home prior to her ED arrival today.  ED Course: Blood pressure elevated, remainder of vital signs stable.  Head CT showing subtle hypoattenuation in the anterior limb of the left internal capsule and left periventricular deep white matter in the left frontal lobe.  Findings may represent chronic microvascular ischemic changes with asymmetric left frontal involvement, cannot exclude recent white matter infarct in this area.  Brain MRI recommended for further evaluation.  ED provider discussed the case with Dr. Laurence Slate from neurology who recommended admission for stroke work-up and brain MRI.  Out of TPA window.  Review of Systems:  All systems reviewed and apart from history of presenting illness, are negative.  Past Medical History:  Diagnosis Date  . COPD (chronic obstructive pulmonary disease) (HCC)   . Depression   . Head ache      Past Surgical History:  Procedure Laterality Date  . CARPAL TUNNEL RELEASE    . CESAREAN SECTION     1985  . CESAREAN SECTION     1987     reports that she has been smoking cigarettes. She has a 72.00 pack-year smoking history. She has never used smokeless tobacco. She reports current alcohol use. She reports that she does not use drugs.  Allergies  Allergen Reactions  . Ibuprofen Swelling  . Amoxicillin Hives    Has patient had a PCN reaction causing immediate rash, facial/tongue/throat swelling, SOB or lightheadedness with hypotension: No Has patient had a PCN reaction causing severe rash involving mucus membranes or skin necrosis: No Has patient had a PCN reaction that required hospitalization: No Has patient had a PCN reaction occurring within the last 10 years: No If all of the above answers are "NO", then may proceed with Cephalosporin use.\    Family History  Problem Relation Age of Onset  . Pancreatic cancer Mother   . Heart disease Father   . Heart attack Father     Prior to Admission medications   Medication Sig Start Date End Date Taking? Authorizing Provider  albuterol (VENTOLIN HFA) 108 (90 Base) MCG/ACT inhaler Inhale 2 puffs into the lungs every 6 (six) hours as needed for wheezing or shortness of breath. 03/17/19   Parrett, Tammy S, NP  nicotine (NICODERM CQ - DOSED IN MG/24 HOURS) 21 mg/24hr patch PLACE 1 PATCH (21 MG  TOTAL) ONTO THE SKIN DAILY. Patient not taking: Reported on 10/12/2019 01/19/19   Saguier, Percell Miller, PA-C  omeprazole (PRILOSEC) 40 MG capsule Take 1 capsule (40 mg total) by mouth daily. 11/18/19   Saguier, Percell Miller, PA-C  umeclidinium-vilanterol [facility] ELLIPTA) 62.5-25 MCG/INH AEPB TAKE 1 PUFF BY MOUTH EVERY DAY 10/12/19   Melvenia Needles, NP    Physical Exam: Vitals:   11/18/19 1624 11/18/19 1700 11/18/19 1730 11/18/19 1911  BP: (!) 174/93 (!) 171/93 (!) 172/95 (!) 177/120  Pulse: 80 82 77 78  Resp: 18 18 20 17   Temp:   98.2 F (36.8 C) 98.2  F (36.8 C)  TempSrc:    Oral  SpO2: 97% 95% 96% 97%  Weight:      Height:        Physical Exam  Constitutional: She is oriented to person, place, and time. She appears well-developed and well-nourished. No distress.  HENT:  Head: Normocephalic.  Eyes: EOM are normal.  Cardiovascular: Normal rate, regular rhythm and intact distal pulses.  Pulmonary/Chest: Effort normal and breath sounds normal. No respiratory distress. She has no wheezes. She has no rales.  Abdominal: Soft. Bowel sounds are normal. She exhibits no distension. There is no abdominal tenderness. There is no guarding.  Musculoskeletal:        General: No edema.     Cervical back: Neck supple.  Neurological: She is alert and oriented to person, place, and time.  Speech fluent, tongue midline, no facial droop Strength grossly intact 5 out of 5 in bilateral upper and lower extremities.  Sensation to light touch diminished on the right side of her entire face, right upper extremity, and right lower extremity.  Skin: Skin is warm and dry. She is not diaphoretic.     Labs on Admission: I have personally reviewed following labs and imaging studies  CBC: Recent Labs  Lab 11/18/19 1351  WBC 5.1  NEUTROABS 2.9  HGB 15.1*  HCT 44.1  MCV 104.3*  PLT 277   Basic Metabolic Panel: Recent Labs  Lab 11/18/19 1351  NA 137  K 4.0  CL 101  CO2 26  GLUCOSE 103*  BUN 14  CREATININE 0.56  CALCIUM 9.2   GFR: Estimated Creatinine Clearance: 61.6 mL/min (by C-G formula based on SCr of 0.56 mg/dL). Liver Function Tests: Recent Labs  Lab 11/18/19 1351  AST 13*  ALT 14  ALKPHOS 72  BILITOT 0.5  PROT 7.4  ALBUMIN 4.2   No results for input(s): LIPASE, AMYLASE in the last 168 hours. No results for input(s): AMMONIA in the last 168 hours. Coagulation Profile: Recent Labs  Lab 11/18/19 1351  INR 0.9   Cardiac Enzymes: No results for input(s): CKTOTAL, CKMB, CKMBINDEX, TROPONINI in the last 168 hours. BNP (last 3  results) No results for input(s): PROBNP in the last 8760 hours. HbA1C: No results for input(s): HGBA1C in the last 72 hours. CBG: Recent Labs  Lab 11/18/19 1350  GLUCAP 108*   Lipid Profile: No results for input(s): CHOL, HDL, LDLCALC, TRIG, CHOLHDL, LDLDIRECT in the last 72 hours. Thyroid Function Tests: No results for input(s): TSH, T4TOTAL, FREET4, T3FREE, THYROIDAB in the last 72 hours. Anemia Panel: No results for input(s): VITAMINB12, FOLATE, FERRITIN, TIBC, IRON, RETICCTPCT in the last 72 hours. Urine analysis:    Component Value Date/Time   COLORURINE STRAW (A) 11/18/2019 1500   APPEARANCEUR CLEAR 11/18/2019 1500   LABSPEC 1.010 11/18/2019 1500   PHURINE 6.0 11/18/2019 1500   GLUCOSEU NEGATIVE 11/18/2019 1500  HGBUR NEGATIVE 11/18/2019 1500   HGBUR negative 02/14/2010 1049   BILIRUBINUR NEGATIVE 11/18/2019 1500   KETONESUR NEGATIVE 11/18/2019 1500   PROTEINUR NEGATIVE 11/18/2019 1500   UROBILINOGEN 0.2 02/14/2010 1049   NITRITE NEGATIVE 11/18/2019 1500   LEUKOCYTESUR NEGATIVE 11/18/2019 1500    Radiological Exams on Admission: CT HEAD WO CONTRAST  Result Date: 11/18/2019 CLINICAL DATA:  Focal neuro deficit, weakness on the right EXAM: CT HEAD WITHOUT CONTRAST TECHNIQUE: Contiguous axial images were obtained from the base of the skull through the vertex without intravenous contrast. COMPARISON:  None. FINDINGS: Brain: Subtle hypoattenuation in the anterior limb of left internal capsule and left periventricular deep white matter in the left frontal lobe. No signs of hemorrhage, hydrocephalus, midline shift or mass effect. Vascular: No hyperdense vessel or unexpected calcification. Skull: Normal. Negative for fracture or focal lesion. Sinuses/Orbits: Visualized paranasal sinuses are unremarkable. The visualized portions of the orbits also normal. Other: None. IMPRESSION: Subtle hypoattenuation in the anterior limb of left internal capsule and left periventricular deep  white matter in the left frontal lobe. Findings may represent chronic microvascular ischemic changes with asymmetric left frontal involvement, can not exclude a recent white matter infarct in this area. MRI may be helpful for further assessment. Electronically Signed   By: Donzetta Kohut M.D.   On: 11/18/2019 14:22    EKG: Independently reviewed.  Sinus rhythm, incomplete RBBB, nonspecific T wave abnormalities.  Assessment/Plan Principal Problem:   CVA (cerebrovascular accident) [facility]) Active Problems:   Tobacco use   COPD (chronic obstructive pulmonary disease) (HCC)   Right-sided weakness and numbness, acute stroke suspected: Head CT showing subtle hypoattenuation in the anterior limb of the left internal capsule and left periventricular deep white matter in the left frontal lobe.  Findings may represent chronic microvascular ischemic changes with asymmetric left frontal involvement, cannot exclude recent white matter infarct in this area.  Brain MRI recommended for further evaluation. Out of TPA window. -Neurology has been consulted -Telemetry monitoring -Allow for permissive hypertension-treat only if systolic blood pressures are greater than 220. -MRI of the brain without contrast, any additional imaging per neurology recommendations -2D echocardiogram -Hemoglobin A1c, fasting lipid panel -Aspirin 325 p.o. daily starting tomorrow.   Patient took [place] powder (aspirin 520 mg) prior to ED arrival today. -Atorvastatin 80 mg daily -Frequent neurochecks -PT, OT, speech therapy. -N.p.o. until cleared by bedside swallow evaluation or formal speech evaluation  COPD: Stable.  No wheezing, cough, or signs of respiratory distress.  Give DuoNebs as needed.  Current tobacco use: Nicotine patch has been ordered, continue to counsel on smoking cessation.  Pharmacy med rec pending.  DVT prophylaxis: Lovenox Code Status: Full code Family Communication: No family available at this  time. Disposition Plan: Anticipate discharge after stroke work-up has been completed. Consults called: Neurology (Dr. Wilford Corner) Admission status: It is my clinical opinion that admission to INPATIENT is reasonable and necessary because of the expectation that this patient will require hospital care that crosses at least 2 midnights to treat this condition based on the medical complexity of the problems presented.  Given the aforementioned information, the predictability of an adverse outcome is felt to be significant.  The medical decision making on this patient was of high complexity and the patient is at high risk for clinical deterioration, therefore this is a level 3 visit.  John Giovanni MD [facility]  If 7PM-7AM, please contact night-coverage [email or web address]  11/18/2019, 7:50 PM

## 2019-11-18 NOTE — ED Notes (Signed)
Attempted to call report. Nurse unavailable. Will call back in 10 minutes. [facility] enroute.

## 2019-11-18 NOTE — ED Provider Notes (Signed)
[facility] EMERGENCY DEPARTMENT Provider Note   CSN: [id number] Arrival date & time: 11/18/19  1337     History Chief Complaint  Patient presents with  . Numbness    Erin Sloan is a 59 y.o. female.  Pt presents to the ED today with right arm and leg weakness.  Pt was last normal around 1915 yesterday.  Pt does not feel like her speech is slurred and she can still drive. Pt said she went to work around [place], but then was told to go home around 0800.  She called her doctor, but could not get an appt until this afternoon.  When her pcp saw her, he sent her straight here.         Past Medical History:  Diagnosis Date  . COPD (chronic obstructive pulmonary disease) ([place])   . Depression   . Head ache     Patient Active Problem List   Diagnosis Date Noted  . CVA (cerebrovascular accident) ([place]) 11/18/2019  . Pulmonary nodules 10/10/2018  . Allergic reaction 08/20/2018  . Alcohol use 08/20/2018  . COPD (chronic obstructive pulmonary disease) ([place]) 08/20/2018  . Acute allergic reaction   . Tobacco abuse 08/19/2018  . Acute bronchitis 08/19/2018  . Hypokalemia 08/19/2018  . WRIST PAIN, BILATERAL 02/14/2010  . DYSURIA 02/14/2010  . SINUSITIS - ACUTE-NOS 12/12/2009    Past Surgical History:  Procedure Laterality Date  . CARPAL TUNNEL RELEASE    . CESAREAN SECTION     1985  . CESAREAN SECTION     1987     OB History   No obstetric history on file.     Family History  Problem Relation Age of Onset  . Pancreatic cancer Mother   . Heart disease Father   . Heart attack Father     Social History   Tobacco Use  . Smoking status: Current Every Day Smoker    Packs/day: 1.50    Years: 48.00    Pack years: 72.00    Types: Cigarettes  . Smokeless tobacco: Never Used  Substance Use Topics  . Alcohol use: Yes    Comment: socially. moderate now.  years ago drank heavier on weekends.  . Drug use: No    Home Medications Prior to Admission  medications   Medication Sig Start Date End Date Taking? Authorizing Provider  albuterol (VENTOLIN HFA) 108 (90 Base) MCG/ACT inhaler Inhale 2 puffs into the lungs every 6 (six) hours as needed for wheezing or shortness of breath. 03/17/19   Parrett, Tammy S, NP  nicotine (NICODERM CQ - DOSED IN MG/24 HOURS) 21 mg/24hr patch PLACE 1 PATCH (21 MG TOTAL) ONTO THE SKIN DAILY. Patient not taking: Reported on 10/12/2019 01/19/19   Saguier, Percell Miller, PA-C  omeprazole (PRILOSEC) 40 MG capsule Take 1 capsule (40 mg total) by mouth daily. 11/18/19   Saguier, Percell Miller, PA-C  umeclidinium-vilanterol (ANORO ELLIPTA) 62.5-25 MCG/INH AEPB TAKE 1 PUFF BY MOUTH EVERY DAY 10/12/19   Parrett, Fonnie Mu, NP    Allergies    Ibuprofen and Amoxicillin  Review of Systems   Review of Systems  Neurological: Positive for weakness and numbness.  All other systems reviewed and are negative.   Physical Exam Updated Vital Signs BP (!) 167/104 (BP Location: Left Arm)   Pulse 78   Temp 99.1 F (37.3 C) (Oral)   Resp 18   Ht 5\' 4"  (1.626 m)   Wt 52.2 kg   SpO2 100%   BMI 19.74 kg/m  Physical Exam Vitals and nursing note reviewed.  Constitutional:      Appearance: Normal appearance.  HENT:     Head: Normocephalic and atraumatic.     Right Ear: External ear normal.     Left Ear: External ear normal.     Nose: Nose normal.  Cardiovascular:     Rate and Rhythm: Normal rate and regular rhythm.     Pulses: Normal pulses.     Heart sounds: Normal heart sounds.  Pulmonary:     Effort: Pulmonary effort is normal.     Breath sounds: Normal breath sounds.  Abdominal:     General: Abdomen is flat. Bowel sounds are normal.     Palpations: Abdomen is soft.  Musculoskeletal:        General: Normal range of motion.     Cervical back: Normal range of motion and neck supple.  Skin:    General: Skin is warm.     Capillary Refill: Capillary refill takes less than 2 seconds.  Neurological:     General: No focal deficit  present.     Mental Status: She is alert and oriented to person, place, and time.  Psychiatric:        Mood and Affect: Mood normal.        Behavior: Behavior normal.        Thought Content: Thought content normal.        Judgment: Judgment normal.     ED Results / Procedures / Treatments   Labs (all labs ordered are listed, but only abnormal results are displayed) Labs Reviewed  CBC - Abnormal; Notable for the following components:      Result Value   Hemoglobin 15.1 (*)    MCV 104.3 (*)    MCH 35.7 (*)    All other components within normal limits  COMPREHENSIVE METABOLIC PANEL - Abnormal; Notable for the following components:   Glucose, Bld 103 (*)    AST 13 (*)    All other components within normal limits  CBG MONITORING, ED - Abnormal; Notable for the following components:   Glucose-Capillary 108 (*)    All other components within normal limits  ETHANOL  PROTIME-INR  APTT  DIFFERENTIAL  RAPID URINE DRUG SCREEN, HOSP PERFORMED  URINALYSIS, ROUTINE W REFLEX MICROSCOPIC  PREGNANCY, URINE    EKG EKG Interpretation  Date/Time:  Wednesday November 18 2019 13:45:47 EST Ventricular Rate:  75 PR Interval:    QRS Duration: 98 QT Interval:  376 QTC Calculation: 420 R Axis:   67 Text Interpretation: Sinus rhythm Confirmed by Jacalyn Lefevre [phone]) on 11/18/2019 1:52:39 PM   Radiology CT HEAD WO CONTRAST  Result Date: 11/18/2019 CLINICAL DATA:  Focal neuro deficit, weakness on the right EXAM: CT HEAD WITHOUT CONTRAST TECHNIQUE: Contiguous axial images were obtained from the base of the skull through the vertex without intravenous contrast. COMPARISON:  None. FINDINGS: Brain: Subtle hypoattenuation in the anterior limb of left internal capsule and left periventricular deep white matter in the left frontal lobe. No signs of hemorrhage, hydrocephalus, midline shift or mass effect. Vascular: No hyperdense vessel or unexpected calcification. Skull: Normal. Negative for fracture or  focal lesion. Sinuses/Orbits: Visualized paranasal sinuses are unremarkable. The visualized portions of the orbits also normal. Other: None. IMPRESSION: Subtle hypoattenuation in the anterior limb of left internal capsule and left periventricular deep white matter in the left frontal lobe. Findings may represent chronic microvascular ischemic changes with asymmetric left frontal involvement, can not exclude a recent white  matter infarct in this area. MRI may be helpful for further assessment. Electronically Signed   By: Donzetta Kohut M.D.   On: 11/18/2019 14:22    Procedures Procedures (including critical care time)  Medications Ordered in ED Medications  sodium chloride 0.9 % bolus 500 mL (500 mLs Intravenous New Bag/Given 11/18/19 1421)    Followed by  0.9 %  sodium chloride infusion (has no administration in time range)    ED Course  I have reviewed the triage vital signs and the nursing notes.  Pertinent labs & imaging results that were available during my care of the patient were reviewed by me and considered in my medical decision making (see chart for details).    MDM Rules/Calculators/A&P                     Pt is not a code stroke.  She is not a tpa candidate due to time.  Pt did take a Goody powder (ASA 520 mg) today around 11.  Pt d/w Dr. Laurence Slate.  He feels pt can be admitted to [facility] for a stroke work up and get an MRI as an inpatient.    Pt d/w Dr. Debby Bud ([facility]) who will admit.  CRITICAL CARE Performed by: Jacalyn Lefevre   Total critical care time: 30 minutes  Critical care time was exclusive of separately billable procedures and treating other patients.  Critical care was necessary to treat or prevent imminent or life-threatening deterioration.  Critical care was time spent personally by me on the following activities: development of treatment plan with patient and/or surrogate as well as nursing, discussions with consultants, evaluation of patient's response to  treatment, examination of patient, obtaining history from patient or surrogate, ordering and performing treatments and interventions, ordering and review of laboratory studies, ordering and review of radiographic studies, pulse oximetry and re-evaluation of patient's condition.  Final Clinical Impression(s) / ED Diagnoses Final diagnoses:  Cerebrovascular accident (CVA), unspecified mechanism (HCC)  Tobacco abuse    Rx / DC Orders ED Discharge Orders    None       Jacalyn Lefevre, MD 11/18/19 1513

## 2019-11-18 NOTE — Patient Instructions (Signed)
You do report onset of right-sided upper and lower lip tingling sensation last night along with tingling/numbness sensation to right upper extremity and lower extremity this morning on the way to work.  Now with slight perceived right hand grip weakness and you had slightly poor heel-to-toe gait.  Blood pressure little bit elevated and you do have history of smoking.  I do think we need to proceed with constant have evaluated in the emergency department for CT head imaging studies and further neurologic evaluation.  I did talk with ED MD working presently.  Please go downstairs now to get further evaluation/work-up  You also mentioned in his exam recent minimal epigastric tenderness which was resolved with 40 mg tablet borrowed from a friend.  Taking this might have been omeprazole so prescribed 40mg  tablet.  Recommend healthy diet as explained.  Follow-up with to be determined after ED evaluation.

## 2019-11-18 NOTE — ED Triage Notes (Signed)
Numbness in face and rt side of body at 715 last night  And then she started to have numbness on rt leg below her knee pt can still drive  And still walk ,  Doesn't feel  Her speech is slurred but rt squeeze and rt leg slightly weaker than  left

## 2019-11-18 NOTE — Progress Notes (Signed)
   Subjective:    Patient ID: Erin Sloan, female    DOB: February 28, 1961, 59 y.o.   MRN: [id number]  HPI  Pt in reports late last night she had numbness to her rt side upper and lower lip on rt side. This happened last night at 7:15 pm. This morning on way to work has numb feeling to arm and her thigh. Like novocaine to extremity. Pt feel like grip is little weak rt hand. Pt thinks maybe gate is little off.   Pt bp is mild high.   Talked with staff who scheduled her and offered nurse triage to see if need ED but pt declined.   Pt never had ministroke. Pt is smoker. Dad had hx mi.    Review of Systems  Neurological: Positive for headaches.       Mild daily ha. Constant per pt takes goody powder.       Objective:   Physical Exam  General Mental Status- Alert. General Appearance- Not in acute distress.   Skin General: Color- Normal Color. Moisture- Normal Moisture.  Neck Carotid Arteries- Normal color. Moisture- Normal Moisture. No carotid bruits. No JVD.  Chest and Lung Exam Auscultation: Breath Sounds:-Normal.  Cardiovascular Auscultation:Rythm- Regular. Murmurs & Other Heart Sounds:Auscultation of the heart reveals- No Murmurs.  Abdomen Inspection:-Inspeection Normal. Palpation/Percussion:Note:No mass. Palpation and Percussion of the abdomen reveal- Non Tender, Non Distended + BS, no rebound or guarding.   Neurologic Cranial Nerve exam:- CN III-XII intact(No nystagmus), symmetric smile. Drift Test:- No drift. Romberg Exam:- Negative.  Heal to Toe Gait exam: slight off. Pt attributes to rt leg. Finger to Nose:- Normal/Intact Strength:- 4/5 rt upper ext grip faint weak. Left side- 5/5 strength Rt hand sharp and dull discrimation not intact.      Assessment & Plan:  You do report onset of right-sided upper and lower lip tingling sensation last night along with tingling/numbness sensation to right upper extremity and lower extremity this morning on the way to  work.  Now with slight perceived right hand grip weakness and you had slightly poor heel-to-toe gait.  Blood pressure little bit elevated and you do have history of smoking.  I do think we need to proceed with constant have evaluated in the emergency department for CT head imaging studies and further neurologic evaluation.  I did talk with ED MD working presently.  Please go downstairs now to get further evaluation/work-up  You also mentioned in his exam recent minimal epigastric tenderness which was resolved with 40 mg tablet borrowed from a friend.  Taking this might have been omeprazole so prescribed 40mg  tablet.  Recommend healthy diet as explained.  Follow-up with to be determined after ED evaluation.  30 minutes spent with patient today.

## 2019-11-18 NOTE — Progress Notes (Signed)
Received from [facility].  Oriented to room and surroundings.  VS taken.  Paged on call attending and was informed that Dr Loney Loh will be seeing her.  Also paged on call neurologist as well.  BP was 177/120 and both internal med MD and neurologist notified.

## 2019-11-18 NOTE — Consult Note (Signed)
Neurology Consultation  Referring physician: Dr. Loney Loh, [facility] hospitalist Reason for consultation: Right-sided numbness and weakness  CC: Right-sided numbness and weakness  History is obtained from: Patient, chart  HPI: Erin Sloan is a 59 y.o. female past medical history of COPD, current tobacco smoker since the age of 59 years old, history of headaches, presenting to the emergency room at [facility] for evaluation of right-sided numbness and weakness with last known normal at 4 PM on 11/17/2019. She says she came back from work yesterday on 11/17/2019 around 4 PM and was watching TV when she suddenly noted that her right side of the face and lip had started to feel tingly and then numb.  She did not make much of it went to bed and upon waking up her whole face arm and leg felt numb.  She thought this would go away but when it did not improve, she sought care at the emergency department.  She was outside the window for IV TPA at that time.  Her symptoms were not consistent with a large vessel occlusion hence no acute intervention was recommended. She was transferred over to [facility] for further imaging and higher level of care including a neurological consultation. She denies having had strokes in the past. No family history of strokes.  Father had a heart attack requiring bypass surgery. She continues to smoke.  Has been smoking since the age of 59 years old.  She has worked with her pulmonologist and attempting to quit but thus far has been unsuccessful in her attempts to quit. Denies any chest pain shortness of breath.  Denies fevers chills.  Denies nausea vomiting.  Denies abdominal pain.  Denies any visual symptoms.  Denies any headache at this time.   LKW: 4 PM on November 17, 2019 tpa given?: no, outside the window Premorbid modified Rankin scale (mRS): 0  ROS performed and negative except as noted in the HPI.  Past Medical History:  Diagnosis Date  . COPD  (chronic obstructive pulmonary disease) (HCC)   . Depression   . Head ache     Family History  Problem Relation Age of Onset  . Pancreatic cancer Mother   . Heart disease Father   . Heart attack Father    Social History:   reports that she has been smoking cigarettes. She has a 72.00 pack-year smoking history. She has never used smokeless tobacco. She reports current alcohol use. She reports that she does not use drugs.  Medications  Current Facility-Administered Medications:  .   stroke: mapping our early stages of recovery book, , Does not apply, Once, John Giovanni, MD .  acetaminophen (TYLENOL) tablet 650 mg, 650 mg, Oral, Q4H PRN **OR** acetaminophen (TYLENOL) 160 MG/5ML solution 650 mg, 650 mg, Per Tube, Q4H PRN **OR** acetaminophen (TYLENOL) suppository 650 mg, 650 mg, Rectal, Q4H PRN, John Giovanni, MD .  Melene Muller ON 11/19/2019] aspirin EC tablet 325 mg, 325 mg, Oral, Daily, John Giovanni, MD .  atorvastatin (LIPITOR) tablet 80 mg, 80 mg, Oral, q1800, John Giovanni, MD .  enoxaparin (LOVENOX) injection 40 mg, 40 mg, Subcutaneous, Q24H, Rathore, Vasundhra, MD .  ipratropium-albuterol (DUONEB) 0.5-2.5 (3) MG/3ML nebulizer solution 3 mL, 3 mL, Nebulization, Q6H PRN, John Giovanni, MD .  nicotine (NICODERM CQ - dosed in mg/24 hours) patch 21 mg, 21 mg, Transdermal, Daily, John Giovanni, MD  Exam: Current vital signs: BP (!) 177/120 (BP Location: Right Arm)   Pulse 78   Temp 98.2 F (  36.8 C) (Oral)   Resp 17   Ht 5\' 4"  (1.626 m)   Wt 52.2 kg   SpO2 97%   BMI 19.74 kg/m  Vital signs in last 24 hours: Temp:  [97.3 F (36.3 C)-99.1 F (37.3 C)] 98.2 F (36.8 C) (03/10 1911) Pulse Rate:  [71-82] 78 (03/10 1911) Resp:  [12-20] 17 (03/10 1911) BP: (151-188)/(74-120) 177/120 (03/10 1911) SpO2:  [95 %-100 %] 97 % (03/10 1911) Weight:  [52.2 kg] 52.2 kg (03/10 1350)  GENERAL: Awake, alert in NAD HEENT: - Normocephalic and atraumatic, dry mm, no  LN++, no Thyromegally LUNGS -  scattered wheezes CV - S1S2 RRR, no m/r/g, equal pulses bilaterally. ABDOMEN - Soft, nontender, nondistended with normoactive BS Ext: warm, well perfused, intact peripheral pulses, no edema  NEURO:  Mental Status: AA&Ox3  Language: speech is nondysarthric.  Naming, repetition, fluency, and comprehension intact. Cranial Nerves: PERRL. EOMI, visual fields full, no facial asymmetry, facial sensation intact, hearing intact, tongue/uvula/soft palate midline, normal sternocleidomastoid and trapezius muscle strength. No evidence of tongue atrophy or fibrillations Motor: No vertical drift in any of the 4 extremities.  On detailed exam, she has a mild right pronator drift.  She also has 4+/5 strength in the right lower extremity compared to 5/5 in the left lower extremity. Tone: is normal and bulk is normal Sensation-markedly diminished on the right hemibody compared to the left Coordination: FTN intact bilaterally, no ataxia in BLE. Gait- deferred  NIHSS-2 for sensory   Labs I have reviewed labs in epic and the results pertinent to this consultation are:  CBC    Component Value Date/Time   WBC 5.1 11/18/2019 1351   RBC 4.23 11/18/2019 1351   HGB 15.1 (H) 11/18/2019 1351   HCT 44.1 11/18/2019 1351   PLT 228 11/18/2019 1351   MCV 104.3 (H) 11/18/2019 1351   MCH 35.7 (H) 11/18/2019 1351   MCHC 34.2 11/18/2019 1351   RDW 12.6 11/18/2019 1351   LYMPHSABS 1.2 11/18/2019 1351   MONOABS 0.9 11/18/2019 1351   EOSABS 0.1 11/18/2019 1351   BASOSABS 0.0 11/18/2019 1351    CMP     Component Value Date/Time   NA 137 11/18/2019 1351   K 4.0 11/18/2019 1351   CL 101 11/18/2019 1351   CO2 26 11/18/2019 1351   GLUCOSE 103 (H) 11/18/2019 1351   BUN 14 11/18/2019 1351   CREATININE 0.56 11/18/2019 1351   CALCIUM 9.2 11/18/2019 1351   PROT 7.4 11/18/2019 1351   ALBUMIN 4.2 11/18/2019 1351   AST 13 (L) 11/18/2019 1351   ALT 14 11/18/2019 1351   ALKPHOS 72  11/18/2019 1351   BILITOT 0.5 11/18/2019 1351   GFRNONAA >60 11/18/2019 1351   GFRAA >60 11/18/2019 1351    Imaging I have reviewed the images obtained:  CT-scan of the brain-no acute changes  Assessment:  59 year old woman with past medical history of COPD and tobacco abuse presenting for sudden onset of right hemibody numbness and some weakness on the right arm and leg. On examination, she has subtle right arm and leg weakness and hemibody sensory loss on the right side in comparison to the left. Likely a small vessel thalamic capsular or brainstem acute ischemic stroke. Outside the window for IV TPA. Symptoms not consistent with large vessel occlusion hence not a candidate for EVT. Has risk factors that include tobacco abuse and possible undiagnosed hypertension that are contributing to her current presentation.  Impression: Small vessel etiology acute ischemic stroke  Recommendations: -MRI  brain without contrast -CT angiogram head and neck -Telemetry -Frequent neurochecks -2D echo -A1c -Lipid panel -Aspirin 325 -Atorvastatin 80 -PT -OT -Speech therapy -Allow for permissive hypertension for 24 to 48 hours from the last known normal only treating systolic blood pressure is more than 220 on a as needed basis.  After that, gradually normalize blood pressures for goal less than 140/90 in the longer run. -Had a detailed discussion about smoking cessation.  Reiterated the fact that smoking cessation is going to be the most important lifestyle change that she will need to make to reduce her stroke risk.  She understands and will work with her pulmonologist, who in the past has attempted various modalities for smoking cessation but thus far have been unsuccessful.  She is motivated to try again and understand the importance of smoking cessation in terms of her general health as well as reducing cerebrovascular and cardiovascular risk factors.  Plan was discussed with Dr. Marlowe Sax at  the bedside  Stroke team will follow with you  -- Amie Portland, MD [facility] Pager: [phone] If 7pm to 7am, please call on call as listed on AMION.

## 2019-11-19 ENCOUNTER — Inpatient Hospital Stay (HOSPITAL_COMMUNITY): Payer: 59

## 2019-11-19 DIAGNOSIS — J449 Chronic obstructive pulmonary disease, unspecified: Secondary | ICD-10-CM

## 2019-11-19 DIAGNOSIS — I63332 Cerebral infarction due to thrombosis of left posterior cerebral artery: Secondary | ICD-10-CM

## 2019-11-19 DIAGNOSIS — I6389 Other cerebral infarction: Secondary | ICD-10-CM

## 2019-11-19 DIAGNOSIS — Z720 Tobacco use: Secondary | ICD-10-CM

## 2019-11-19 LAB — HEMOGLOBIN A1C
Hgb A1c MFr Bld: 5.5 % (ref 4.8–5.6)
Mean Plasma Glucose: 111.15 mg/dL

## 2019-11-19 LAB — LIPID PANEL
Cholesterol: 184 mg/dL (ref 0–200)
HDL: 82 mg/dL (ref >40)
LDL Cholesterol: 89 mg/dL (ref 0–99)
Total CHOL/HDL Ratio: 2.2 RATIO
Triglycerides: 63 mg/dL (ref <150)
VLDL: 13 mg/dL (ref 0–40)

## 2019-11-19 LAB — ECHOCARDIOGRAM COMPLETE
Height: 64 in
Weight: 1840 oz

## 2019-11-19 LAB — HIV ANTIBODY (ROUTINE TESTING W REFLEX): HIV Screen 4th Generation wRfx: NONREACTIVE

## 2019-11-19 MED ORDER — ATORVASTATIN CALCIUM 80 MG PO TABS: 80.0000 mg | ORAL_TABLET | Freq: Every day | ORAL | 0 refills | Status: DC

## 2019-11-19 MED ORDER — CLOPIDOGREL BISULFATE 75 MG PO TABS
75.0000 mg | ORAL_TABLET | Freq: Every day | ORAL | Status: DC
  Administered 2019-11-19: 75 mg via ORAL
  Filled 2019-11-19: qty 1

## 2019-11-19 MED ORDER — ACETAMINOPHEN 325 MG PO TABS
650.0000 mg | ORAL_TABLET | Freq: Four times a day (QID) | ORAL | Status: DC | PRN
  Administered 2019-11-19: 650 mg via ORAL

## 2019-11-19 MED ORDER — ASPIRIN EC 81 MG PO TBEC
81.0000 mg | DELAYED_RELEASE_TABLET | Freq: Every day | ORAL | Status: DC
  Administered 2019-11-19: 81 mg via ORAL
  Filled 2019-11-19: qty 1

## 2019-11-19 MED ORDER — ASPIRIN 81 MG PO TBEC: 81.0000 mg | DELAYED_RELEASE_TABLET | Freq: Every day | ORAL | 0 refills | Status: DC

## 2019-11-19 MED ORDER — CLOPIDOGREL BISULFATE 75 MG PO TABS: 75.0000 mg | ORAL_TABLET | Freq: Every day | ORAL | 0 refills | Status: AC

## 2019-11-19 NOTE — Progress Notes (Signed)
STROKE TEAM PROGRESS NOTE   INTERVAL HISTORY Patient sitting in bed, stated that her right side numbness much improved.  She was educated on smoking cessation.  She is eager to go home today.  Vitals:   11/19/19 0235 11/19/19 0408 11/19/19 0730 11/19/19 1127  BP: 119/67 136/83 (!) 142/100 135/86  Pulse: 75 72 80 71  Resp: 18 16 16 16   Temp: 97.9 F (36.6 C) 98 F (36.7 C) 98.6 F (37 C) 99.1 F (37.3 C)  TempSrc: Oral Oral Oral Oral  SpO2: 99% 95% 95% 97%  Weight:      Height:        CBC:  Recent Labs  Lab 11/18/19 1351  WBC 5.1  NEUTROABS 2.9  HGB 15.1*  HCT 44.1  MCV 104.3*  PLT 382    Basic Metabolic Panel:  Recent Labs  Lab 11/18/19 1351  NA 137  K 4.0  CL 101  CO2 26  GLUCOSE 103*  BUN 14  CREATININE 0.56  CALCIUM 9.2   Lipid Panel:     Component Value Date/Time   CHOL 184 11/19/2019 0444   TRIG 63 11/19/2019 0444   HDL 82 11/19/2019 0444   CHOLHDL 2.2 11/19/2019 0444   VLDL 13 11/19/2019 0444   LDLCALC 89 11/19/2019 0444   HgbA1c:  Lab Results  Component Value Date   HGBA1C 5.5 11/19/2019   Urine Drug Screen:     Component Value Date/Time   LABOPIA NONE DETECTED 11/18/2019 1500   COCAINSCRNUR NONE DETECTED 11/18/2019 1500   LABBENZ NONE DETECTED 11/18/2019 1500   AMPHETMU NONE DETECTED 11/18/2019 1500   THCU NONE DETECTED 11/18/2019 1500   LABBARB NONE DETECTED 11/18/2019 1500    Alcohol Level     Component Value Date/Time   ETH <10 11/18/2019 1351    IMAGING past 24 hours CT ANGIO HEAD W OR WO CONTRAST  Result Date: 11/18/2019 CLINICAL DATA:  59 year old female who presented with right arm and leg weakness. EXAM: CT ANGIOGRAPHY HEAD AND NECK TECHNIQUE: Multidetector CT imaging of the head and neck was performed using the standard protocol during bolus administration of intravenous contrast. Multiplanar CT image reconstructions and MIPs were obtained to evaluate the vascular anatomy. Carotid stenosis measurements (when applicable)  are obtained utilizing NASCET criteria, using the distal internal carotid diameter as the denominator. CONTRAST:  144mL OMNIPAQUE IOHEXOL 350 MG/ML SOLN COMPARISON:  Plain head CT earlier today. CTA chest 08/19/2018. FINDINGS: CTA NECK Skeleton: Multilevel cervical spine degeneration. Osteopenia. No acute osseous abnormality identified. Upper chest: Chronic centrilobular emphysema. No superior mediastinal lymphadenopathy. Other neck: Subcentimeter partially calcified right thyroid nodule does not meet consensus criteria for ultrasound follow-up. No acute neck soft tissue findings. Aortic arch: 3 vessel arch configuration with mild arch atherosclerosis. Right carotid system: No brachiocephalic artery stenosis despite plaque. Negative right CCA origin. Mild soft and calcified plaque at the right ICA origin and bulb without stenosis. Left carotid system: No left CCA origin stenosis despite plaque. Mild calcified plaque at the left ICA origin, no stenosis. Vertebral arteries: Normal proximal right subclavian artery and right vertebral artery origin. Patent right vertebral artery to the skull base without plaque or stenosis. Normal proximal left subclavian artery and left vertebral artery origin. Mildly dominant left vertebral artery is patent to the skull base without plaque or stenosis. CTA HEAD Posterior circulation: Mildly dominant left V4 segment. No distal vertebral or vertebrobasilar junction plaque or stenosis. Normal right PICA origin. The left AICA may be dominant. Patent basilar artery without  stenosis. Normal SCA and PCA origins. Posterior communicating arteries are diminutive or absent. Bilateral PCA branches are within normal limits. Anterior circulation: Patent ICA siphons. Mild calcified plaque on the left without stenosis. Mild calcified plaque on the right without stenosis. Normal ophthalmic artery origins. Patent carotid termini. Normal MCA and ACA origins. Normal anterior communicating artery.  Bilateral ACA branches are within normal limits. Left MCA M1 segment and trifurcation are patent without stenosis. Left MCA branches are within normal limits. Right MCA M1 segment and bifurcation are patent without stenosis. Right MCA branches are within normal limits. Venous sinuses: Patent. Anatomic variants: Dominant left vertebral artery. Review of the MIP images confirms the above findings IMPRESSION: 1. Negative for large vessel occlusion. 2. Relatively mild for age atherosclerosis in the head and neck with no significant arterial stenosis identified. 3. Aortic Atherosclerosis (ICD10-I70.0) and Emphysema (ICD10-J43.9). 4. Mild thyroid goiter. Electronically Signed   By: Odessa FlemingH  Hall M.D.   On: 11/18/2019 22:59   CT HEAD WO CONTRAST  Result Date: 11/18/2019 CLINICAL DATA:  Focal neuro deficit, weakness on the right EXAM: CT HEAD WITHOUT CONTRAST TECHNIQUE: Contiguous axial images were obtained from the base of the skull through the vertex without intravenous contrast. COMPARISON:  None. FINDINGS: Brain: Subtle hypoattenuation in the anterior limb of left internal capsule and left periventricular deep white matter in the left frontal lobe. No signs of hemorrhage, hydrocephalus, midline shift or mass effect. Vascular: No hyperdense vessel or unexpected calcification. Skull: Normal. Negative for fracture or focal lesion. Sinuses/Orbits: Visualized paranasal sinuses are unremarkable. The visualized portions of the orbits also normal. Other: None. IMPRESSION: Subtle hypoattenuation in the anterior limb of left internal capsule and left periventricular deep white matter in the left frontal lobe. Findings may represent chronic microvascular ischemic changes with asymmetric left frontal involvement, can not exclude a recent white matter infarct in this area. MRI may be helpful for further assessment. Electronically Signed   By: Donzetta KohutGeoffrey  Wile M.D.   On: 11/18/2019 14:22   CT ANGIO NECK W OR WO CONTRAST  Result Date:  11/18/2019 CLINICAL DATA:  59 year old female who presented with right arm and leg weakness. EXAM: CT ANGIOGRAPHY HEAD AND NECK TECHNIQUE: Multidetector CT imaging of the head and neck was performed using the standard protocol during bolus administration of intravenous contrast. Multiplanar CT image reconstructions and MIPs were obtained to evaluate the vascular anatomy. Carotid stenosis measurements (when applicable) are obtained utilizing NASCET criteria, using the distal internal carotid diameter as the denominator. CONTRAST:  100mL OMNIPAQUE IOHEXOL 350 MG/ML SOLN COMPARISON:  Plain head CT earlier today. CTA chest 08/19/2018. FINDINGS: CTA NECK Skeleton: Multilevel cervical spine degeneration. Osteopenia. No acute osseous abnormality identified. Upper chest: Chronic centrilobular emphysema. No superior mediastinal lymphadenopathy. Other neck: Subcentimeter partially calcified right thyroid nodule does not meet consensus criteria for ultrasound follow-up. No acute neck soft tissue findings. Aortic arch: 3 vessel arch configuration with mild arch atherosclerosis. Right carotid system: No brachiocephalic artery stenosis despite plaque. Negative right CCA origin. Mild soft and calcified plaque at the right ICA origin and bulb without stenosis. Left carotid system: No left CCA origin stenosis despite plaque. Mild calcified plaque at the left ICA origin, no stenosis. Vertebral arteries: Normal proximal right subclavian artery and right vertebral artery origin. Patent right vertebral artery to the skull base without plaque or stenosis. Normal proximal left subclavian artery and left vertebral artery origin. Mildly dominant left vertebral artery is patent to the skull base without plaque or stenosis. CTA HEAD Posterior  circulation: Mildly dominant left V4 segment. No distal vertebral or vertebrobasilar junction plaque or stenosis. Normal right PICA origin. The left AICA may be dominant. Patent basilar artery without  stenosis. Normal SCA and PCA origins. Posterior communicating arteries are diminutive or absent. Bilateral PCA branches are within normal limits. Anterior circulation: Patent ICA siphons. Mild calcified plaque on the left without stenosis. Mild calcified plaque on the right without stenosis. Normal ophthalmic artery origins. Patent carotid termini. Normal MCA and ACA origins. Normal anterior communicating artery. Bilateral ACA branches are within normal limits. Left MCA M1 segment and trifurcation are patent without stenosis. Left MCA branches are within normal limits. Right MCA M1 segment and bifurcation are patent without stenosis. Right MCA branches are within normal limits. Venous sinuses: Patent. Anatomic variants: Dominant left vertebral artery. Review of the MIP images confirms the above findings IMPRESSION: 1. Negative for large vessel occlusion. 2. Relatively mild for age atherosclerosis in the head and neck with no significant arterial stenosis identified. 3. Aortic Atherosclerosis (ICD10-I70.0) and Emphysema (ICD10-J43.9). 4. Mild thyroid goiter. Electronically Signed   By: Odessa Fleming M.D.   On: 11/18/2019 22:59   MR BRAIN WO CONTRAST  Result Date: 11/19/2019 CLINICAL DATA:  Initial evaluation for acute stroke, focal neural deficit. EXAM: MRI HEAD WITHOUT CONTRAST TECHNIQUE: Multiplanar, multiecho pulse sequences of the brain and surrounding structures were obtained without intravenous contrast. COMPARISON:  Prior CT from 11/18/2019. FINDINGS: Brain: Generalized age-related cerebral atrophy. Patchy and confluent T2/FLAIR hyperintensity within the periventricular deep white matter most consistent with chronic small vessel ischemic disease, moderate in nature. 5 mm focus of restricted diffusion seen involving the mid left thalamus, consistent with acute ischemic infarct. No associated hemorrhage or mass effect. No other evidence for acute or subacute ischemia. Gray-white matter differentiation otherwise  maintained. No encephalomalacia to suggest chronic cortical infarction. No foci of susceptibility artifact to suggest acute or chronic intracranial hemorrhage. No mass lesion, midline shift or mass effect. No hydrocephalus. No extra-axial fluid collection. Pituitary gland suprasellar region normal. Midline structures intact. Vascular: Major intracranial vascular flow voids are maintained. Skull and upper cervical spine: Craniocervical junction within normal limits. Bone marrow signal intensity normal. No scalp soft tissue abnormality. Sinuses/Orbits: Globes and orbital soft tissues within normal limits. Paranasal sinuses are clear. Small bilateral mastoid effusions noted, of doubtful significance. Other: None. IMPRESSION: 1. 5 mm acute ischemic nonhemorrhagic left thalamic infarct. 2. Age-related cerebral atrophy with moderate chronic small vessel ischemic disease. Electronically Signed   By: Rise Mu M.D.   On: 11/19/2019 01:14   ECHOCARDIOGRAM COMPLETE  Result Date: 11/19/2019    ECHOCARDIOGRAM REPORT   Patient Name:   Erin Sloan Date of Exam: 11/19/2019 Medical Rec #:  [id number]       Height:       64.0 in Accession #:    [id number]      Weight:       115.0 lb Date of Birth:  1961-08-11       BSA:          1.546 m Patient Age:    58 years        BP:           142/100 mmHg Patient Gender: F               HR:           80 bpm. Exam Location:  Inpatient Procedure: 2D Echo Indications:    Stroke 434.91 / I163.9  History:  Patient has no prior history of Echocardiogram examinations.                 COPD; Risk Factors:Current Smoker.  Sonographer:    Leta Jungling RDCS Referring Phys: [id number] VASUNDHRA RATHORE IMPRESSIONS  1. Left ventricular ejection fraction, by estimation, is 55 to 60%. The left ventricle has normal function. The left ventricle has no regional wall motion abnormalities. Left ventricular diastolic parameters are consistent with Grade I diastolic dysfunction (impaired  relaxation).  2. Right ventricular systolic function is normal. The right ventricular size is normal. There is normal pulmonary artery systolic pressure.  3. The mitral valve is normal in structure. Trivial mitral valve regurgitation. No evidence of mitral stenosis.  4. The aortic valve is tricuspid. Aortic valve regurgitation is not visualized. No aortic stenosis is present.  5. The inferior vena cava is normal in size with greater than 50% respiratory variability, suggesting right atrial pressure of 3 mmHg. FINDINGS  Left Ventricle: Left ventricular ejection fraction, by estimation, is 55 to 60%. The left ventricle has normal function. The left ventricle has no regional wall motion abnormalities. The left ventricular internal cavity size was normal in size. There is  no left ventricular hypertrophy. Left ventricular diastolic parameters are consistent with Grade I diastolic dysfunction (impaired relaxation). Indeterminate filling pressures. Right Ventricle: The right ventricular size is normal. No increase in right ventricular wall thickness. Right ventricular systolic function is normal. There is normal pulmonary artery systolic pressure. The tricuspid regurgitant velocity is 1.23 m/s, and  with an assumed right atrial pressure of 3 mmHg, the estimated right ventricular systolic pressure is 9.1 mmHg. Left Atrium: Left atrial size was normal in size. Right Atrium: Right atrial size was normal in size. Pericardium: There is no evidence of pericardial effusion. Mitral Valve: The mitral valve is normal in structure. Normal mobility of the mitral valve leaflets. Trivial mitral valve regurgitation. No evidence of mitral valve stenosis. Tricuspid Valve: The tricuspid valve is normal in structure. Tricuspid valve regurgitation is trivial. No evidence of tricuspid stenosis. Aortic Valve: The aortic valve is tricuspid. Aortic valve regurgitation is not visualized. No aortic stenosis is present. Pulmonic Valve: The pulmonic  valve was normal in structure. Pulmonic valve regurgitation is not visualized. No evidence of pulmonic stenosis. Aorta: The aortic root is normal in size and structure. Venous: The inferior vena cava is normal in size with greater than 50% respiratory variability, suggesting right atrial pressure of 3 mmHg. IAS/Shunts: No atrial level shunt detected by color flow Doppler.  LEFT VENTRICLE PLAX 2D LVIDd:         4.10 cm Diastology LVIDs:         2.90 cm LV e' lateral:   10.20 cm/s LV PW:         1.00 cm LV E/e' lateral: 9.4 LV IVS:        0.70 cm LV e' medial:    8.70 cm/s                        LV E/e' medial:  11.0  RIGHT VENTRICLE RV S prime:     13.50 cm/s TAPSE (M-mode): 1.9 cm LEFT ATRIUM             Index       RIGHT ATRIUM           Index LA diam:        2.50 cm 1.62 cm/m  RA Area:  11.00 cm LA Vol (A2C):   45.4 ml 29.36 ml/m RA Volume:   23.60 ml  15.26 ml/m LA Vol (A4C):   40.9 ml 26.45 ml/m LA Biplane Vol: 46.0 ml 29.75 ml/m  AORTIC VALVE LVOT Vmax:   93.20 cm/s LVOT Vmean:  56.300 cm/s LVOT VTI:    0.174 m  AORTA Ao Root diam: 2.30 cm MITRAL VALVE               TRICUSPID VALVE MV Area (PHT): 4.31 cm    TR Peak grad:   6.1 mmHg MV Decel Time: 176 msec    TR Vmax:        123.00 cm/s MV E velocity: 96.00 cm/s MV A velocity: 90.80 cm/s  SHUNTS MV E/A ratio:  1.06        Systemic VTI: 0.17 m Chilton Si MD Electronically signed by Chilton Si MD Signature Date/Time: 11/19/2019/1:30:40 PM    Final     PHYSICAL EXAM  Temp:  [97.9 F (36.6 C)-99.1 F (37.3 C)] 98.4 F (36.9 C) (03/11 1601) Pulse Rate:  [71-80] 74 (03/11 1601) Resp:  [13-18] 16 (03/11 1601) BP: (119-177)/(65-120) 149/81 (03/11 1601) SpO2:  [95 %-99 %] 98 % (03/11 1601)  General - Well nourished, well developed, in no apparent distress.  Ophthalmologic - fundi not visualized due to noncooperation.  Cardiovascular - Regular rhythm and rate.  Mental Status -  Level of arousal and orientation to time, place, and  person were intact. Language including expression, naming, repetition, comprehension was assessed and found intact. Fund of Knowledge was assessed and was intact.  Cranial Nerves II - XII - II - Visual field intact OU. III, IV, VI - Extraocular movements intact. V - Facial sensation intact bilaterally. VII - Facial movement intact bilaterally. VIII - Hearing & vestibular intact bilaterally. X - Palate elevates symmetrically. XI - Chin turning & shoulder shrug intact bilaterally. XII - Tongue protrusion intact.  Motor Strength - The patient's strength was normal in all extremities and pronator drift was absent.  Bulk was normal and fasciculations were absent.   Motor Tone - Muscle tone was assessed at the neck and appendages and was normal.  Reflexes - The patient's reflexes were symmetrical in all extremities and she had no pathological reflexes.  Sensory - Light touch, temperature/pinprick were assessed and were symmetrical although still feeling subjective right hand fingertip tingling.    Coordination - The patient had normal movements in the hands and feet with no ataxia or dysmetria.  Tremor was absent.  Gait and Station - deferred.   ASSESSMENT/PLAN Erin Sloan is a 59 y.o. female with history of COPD, tobacco use and HA presenting to [facility] with R sided numbness and weakness.   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  CTA head & neck no LVO. Mild atherosclerosis. Aortic atherosclerosis. Emphysema. Mild thyroid goiter.   MRI  L thalamic infarct. Small vessel disease. Atrophy.   2D Echo EF 55-60%. No source of embolus   LDL 89  HgbA1c 5.5  Lovenox 40 mg sq daily for VTE prophylaxis  No antithrombotic prior to admission, now on aspirin 81 mg daily and clopidogrel 75 mg daily. Continue DAPT x 3 weeks then aspirin alone.    Therapy  recommendations:  No PT  Disposition:  Return home  Hyperlipidemia  Home meds:  No statin  Now on lipitor 80  LDL 89, goal < 70  Continue statin at discharge  Tobacco abuse  Current smoker since age 48  Smoking cessation counseling provided  Nicotine patch   Pt is willing to quit  Other Stroke Risk Factors  ETOH use, alcohol level <10, advised to drink no more than 1 drink(s) a day  Other Active Problems  COPD  Hospital day # 1  Neurology will sign off. Please call with questions. Pt will follow up with stroke clinic NP at [facility] in about 4 weeks. Thanks for the consult.  Marvel Plan, MD PhD Stroke Neurology 11/19/2019 6:11 PM  To contact Stroke Continuity provider, please refer to [email or web address]. After hours, contact General Neurology

## 2019-11-19 NOTE — Discharge Instructions (Signed)
Per the Neurologist, take aspirin with plavix together for 3 weeks, afterwards take aspirin alone daily

## 2019-11-19 NOTE — Progress Notes (Signed)
OT Evaluation  PTA, pt independent with mobility and ADL and worked full-time on a factory line at [employer]. Pt reports she feels she is at baseline. Pt able to complete dynamic tasks without any LOB, including simulated job activities. Reviewed warning signs/symptoms of CVA using BeFAST. Educated pt on lifestyle changes to reduce risk of CVA, including smoking cessation. Pt provided handouts and was very appreciative. No further OT needed.     11/19/19 1600  OT Visit Information  Last OT Received On 11/19/19  Assistance Needed +1  History of Present Illness Erin Sloan is a 59 y.o. female past medical history of COPD, current tobacco smoker since the age of 59 years old, history of headaches, presenting to  for evaluation of right-sided numbness and weakness. MRI with 5 mm acute ischemic nonhemorrhagic left thalamic infarct.  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  Pain Assessment  Pain Assessment No/denies pain  Cognition  Arousal/Alertness Awake/alert  Behavior During Therapy WFL for tasks assessed/performed  Overall Cognitive Status Within Functional Limits for tasks assessed  Upper Extremity Assessment  Upper Extremity Assessment Overall WFL for tasks assessed  Lower Extremity Assessment  Lower Extremity Assessment Defer to PT evaluation  Cervical  / Trunk Assessment  Cervical / Trunk Assessment Other exceptions  Cervical / Trunk Exceptions Rounded shoulders  ADL  Overall ADL's  At baseline  Vision- History  Baseline Vision/History No visual deficits  Vision- Assessment  Vision Assessment? No apparent visual deficits  Perception  Comments WFL  Bed Mobility  Overal bed mobility Independent  Transfers  Overall transfer level Independent  Equipment used None  Balance  Overall balance assessment Independent  General Comments  General comments (skin integrity, edema, etc.) Educated pt on smoking cessation - handouts provided  OT - End of Session  Activity Tolerance Patient tolerated treatment well  Patient left in bed;with call bell/phone within reach  Nurse Communication Mobility status  OT Assessment  OT Recommendation/Assessment Patient does not need any further OT services  OT Visit Diagnosis Other (comment) (sensation deficits)  OT Problem List Impaired sensation  AM-PAC OT "6 Clicks" Daily Activity Outcome Measure (Version 2)  Help from another person eating meals? 4  Help from another person taking care of personal grooming? 4  Help from another person toileting, which includes using toliet, bedpan, or urinal? 4  Help from another person bathing (including washing, rinsing, drying)? 4  Help from another person to put on and taking off regular upper body clothing? 4  Help from another person to put on and taking off regular lower body clothing? 4  6 Click Score 24  OT Recommendation  Follow Up Recommendations No OT follow up  OT Equipment None recommended by OT  Acute Rehab OT Goals  Patient Stated Goal "stop smoking."  OT Goal Formulation All assessment and education complete, DC therapy  OT Time Calculation  OT Start Time (ACUTE ONLY) 1545  OT Stop Time (ACUTE ONLY) 1605  OT Time Calculation (min) 20 min  OT General Charges  $OT Visit 1 Visit  OT Evaluation  $OT Eval Low Complexity 1 Low  Written Expression   Dominant Hand Right  Maurie Boettcher, OT/L   Acute OT Clinical Specialist Acute Rehabilitation Services Pager [phone] Office [phone]

## 2019-11-19 NOTE — Progress Notes (Signed)
Discharge instructions given.  IV access removed.  All questions answered.

## 2019-11-19 NOTE — Progress Notes (Addendum)
SLP Cancellation Note  Patient Details Name: Erin Sloan MRN: [id number] DOB: June 03, 1961   Cancelled treatment:       Reason Eval/Treat Not Completed: SLP screened, no needs identified, will sign off Speech/language evaluation order seen and acknowledged. Patient screened, family member at bedside. No speech/language needs identified or reported. ST will sign off for now. ST provided education to pt/family re: BEFAST acronym for stoke symptom awareness. Please re-consult should new needs arise.  Shella Spearing, M.Ed., CCC-SLP Speech Therapy Acute Rehabilitation [phone]: Acute Rehab office [phone] - pager    Shella Spearing 11/19/2019, 11:59 AM

## 2019-11-19 NOTE — Progress Notes (Signed)
  Echocardiogram 2D Echocardiogram has been performed.  Leta Jungling M 11/19/2019, 8:47 AM

## 2019-11-19 NOTE — Discharge Summary (Signed)
Physician Discharge Summary  Erin Sloan:[id number] DOB: 09/23/1960 DOA: 11/18/2019  PCP: [name], PA-C  Admit date: 11/18/2019 Discharge date: 11/19/2019  Admitted From: Home Disposition:  Home  Recommendations for Outpatient Follow-up:  1. Follow up with PCP in 2-3 weeks 2. Follow up with Neurology as scheduled  Discharge Condition:Stable CODE STATUS:Full Diet recommendation: Heart healthy   Brief/Interim Summary: 59 y.o. female with medical history significant of COPD, tobacco use, depression presenting to the ED from her PCPs office for evaluation of right-sided weakness and numbness.  Erin Sloan states yesterday evening 3/9 around 1915 she started experiencing numbness of her lip and cheek on the right side.  She then woke up around 2 in the morning and noticed that her right arm was numb and weak.  She went back to sleep and in the morning when she went to work she noticed that her right leg was numb.  She was able to walk without any difficulty but did have difficulty using her right arm.  She did not have any slurring of speech or drooping of her face.  Denies history of prior strokes.  She has been smoking cigarettes since the age of 17, previously up to 2 packs a day but has now cut down to 1 pack a day.  She took [place] powder (aspirin 520 mg) at home prior to her ED arrival  Discharge Diagnoses:  Principal Problem:   CVA (cerebrovascular accident) [facility]) Active Problems:   Tobacco use   COPD (chronic obstructive pulmonary disease) (HCC)    Right-sided weakness and numbness, acute stroke suspected:  -Head CT showing subtle hypoattenuation in the anterior limb of the left internal capsule and left periventricular deep white matter in the left frontal lobe.  Findings may represent chronic microvascular ischemic changes with asymmetric left frontal involvement, cannot exclude recent white matter infarct in this area.   -Brain MRI reviewed, noted to have 72mm acute  ischemic nonhemorrhagic L thalamic infarct -Neurology consulted and had been following -Stroke w/u completed. Discussed with Neurologist who recommended 3 weeks dual antiplatelet regimen of ASA 81mg  and plavix 75mg  x 3 weeks followed by ASA 81mg  monotherapy afterwards -Pt to follow up with Neurology as outpatient -Cleared by PT/OT/SLP -Continued on 80mg  atorvastatin  COPD: Stable.  No wheezing, cough, or signs of respiratory distress.  Continued on PRN DuoNebs as needed.  Current tobacco use: Nicotine patch has been ordered while in hospital, continue to counsel on smoking cessation.   Discharge Instructions  Discharge Instructions    Ambulatory referral to Neurology   Complete by: As directed    An appointment is requested in approximately: 4 weeks     Allergies as of 11/19/2019      Reactions   Ibuprofen Swelling   Amoxicillin Hives   Has Erin Sloan had a PCN reaction causing immediate rash, facial/tongue/throat swelling, SOB or lightheadedness with hypotension: No Has Erin Sloan had a PCN reaction causing severe rash involving mucus membranes or skin necrosis: No Has Erin Sloan had a PCN reaction that required hospitalization: No Has Erin Sloan had a PCN reaction occurring within the last 10 years: No If all of the above answers are "NO", then may proceed with Cephalosporin use.\      Medication List    STOP taking these medications   omeprazole 40 MG capsule Commonly known as: PRILOSEC     TAKE these medications   albuterol 108 (90 Base) MCG/ACT inhaler Commonly known as: VENTOLIN HFA Inhale 2 puffs into the lungs every 6 (six)  hours as needed for wheezing or shortness of breath.   Anoro Ellipta 62.5-25 MCG/INH Aepb Generic drug: umeclidinium-vilanterol TAKE 1 PUFF BY MOUTH EVERY DAY What changed:   how much to take  how to take this  when to take this  additional instructions   aspirin 81 MG EC tablet Take 1 tablet (81 mg total) by mouth daily. Start taking on:  November 20, 2019   atorvastatin 80 MG tablet Commonly known as: LIPITOR Take 1 tablet (80 mg total) by mouth daily at 6 PM. Start taking on: November 20, 2019   clopidogrel 75 MG tablet Commonly known as: PLAVIX Take 1 tablet (75 mg total) by mouth daily for 21 days. Start taking on: November 20, 2019   MELATONIN PO Take 1 tablet by mouth at bedtime.   nicotine 21 mg/24hr patch Commonly known as: NICODERM CQ - dosed in mg/24 hours PLACE 1 PATCH (21 MG TOTAL) ONTO THE SKIN DAILY.      Follow-up Information    Saguier, Iris Pert. Schedule an appointment as soon as possible for a visit in 2 week(s).   Specialties: Internal Medicine, Family Medicine Contact information: [address] [place] [address] [phone]        Follow up with Neurology as scheduled Follow up.          Allergies  Allergen Reactions  . Ibuprofen Swelling  . Amoxicillin Hives    Has Erin Sloan had a PCN reaction causing immediate rash, facial/tongue/throat swelling, SOB or lightheadedness with hypotension: No Has Erin Sloan had a PCN reaction causing severe rash involving mucus membranes or skin necrosis: No Has Erin Sloan had a PCN reaction that required hospitalization: No Has Erin Sloan had a PCN reaction occurring within the last 10 years: No If all of the above answers are "NO", then may proceed with Cephalosporin use.\    Consultations:  Neurology  Procedures/Studies: CT ANGIO HEAD W OR WO CONTRAST  Result Date: 11/18/2019 CLINICAL DATA:  59 year old female who presented with right arm and leg weakness. EXAM: CT ANGIOGRAPHY HEAD AND NECK TECHNIQUE: Multidetector CT imaging of the head and neck was performed using the standard protocol during bolus administration of intravenous contrast. Multiplanar CT image reconstructions and MIPs were obtained to evaluate the vascular anatomy. Carotid stenosis measurements (when applicable) are obtained utilizing NASCET criteria, using the distal  internal carotid diameter as the denominator. CONTRAST:  158mL OMNIPAQUE IOHEXOL 350 MG/ML SOLN COMPARISON:  Plain head CT earlier today. CTA chest 08/19/2018. FINDINGS: CTA NECK Skeleton: Multilevel cervical spine degeneration. Osteopenia. No acute osseous abnormality identified. Upper chest: Chronic centrilobular emphysema. No superior mediastinal lymphadenopathy. Other neck: Subcentimeter partially calcified right thyroid nodule does not meet consensus criteria for ultrasound follow-up. No acute neck soft tissue findings. Aortic arch: 3 vessel arch configuration with mild arch atherosclerosis. Right carotid system: No brachiocephalic artery stenosis despite plaque. Negative right CCA origin. Mild soft and calcified plaque at the right ICA origin and bulb without stenosis. Left carotid system: No left CCA origin stenosis despite plaque. Mild calcified plaque at the left ICA origin, no stenosis. Vertebral arteries: Normal proximal right subclavian artery and right vertebral artery origin. Patent right vertebral artery to the skull base without plaque or stenosis. Normal proximal left subclavian artery and left vertebral artery origin. Mildly dominant left vertebral artery is patent to the skull base without plaque or stenosis. CTA HEAD Posterior circulation: Mildly dominant left V4 segment. No distal vertebral or vertebrobasilar junction plaque or stenosis. Normal right PICA  origin. The left AICA may be dominant. Patent basilar artery without stenosis. Normal SCA and PCA origins. Posterior communicating arteries are diminutive or absent. Bilateral PCA branches are within normal limits. Anterior circulation: Patent ICA siphons. Mild calcified plaque on the left without stenosis. Mild calcified plaque on the right without stenosis. Normal ophthalmic artery origins. Patent carotid termini. Normal MCA and ACA origins. Normal anterior communicating artery. Bilateral ACA branches are within normal limits. Left MCA M1  segment and trifurcation are patent without stenosis. Left MCA branches are within normal limits. Right MCA M1 segment and bifurcation are patent without stenosis. Right MCA branches are within normal limits. Venous sinuses: Patent. Anatomic variants: Dominant left vertebral artery. Review of the MIP images confirms the above findings IMPRESSION: 1. Negative for large vessel occlusion. 2. Relatively mild for age atherosclerosis in the head and neck with no significant arterial stenosis identified. 3. Aortic Atherosclerosis (ICD10-I70.0) and Emphysema (ICD10-J43.9). 4. Mild thyroid goiter. Electronically Signed   By: Odessa Fleming M.D.   On: 11/18/2019 22:59   CT HEAD WO CONTRAST  Result Date: 11/18/2019 CLINICAL DATA:  Focal neuro deficit, weakness on the right EXAM: CT HEAD WITHOUT CONTRAST TECHNIQUE: Contiguous axial images were obtained from the base of the skull through the vertex without intravenous contrast. COMPARISON:  None. FINDINGS: Brain: Subtle hypoattenuation in the anterior limb of left internal capsule and left periventricular deep white matter in the left frontal lobe. No signs of hemorrhage, hydrocephalus, midline shift or mass effect. Vascular: No hyperdense vessel or unexpected calcification. Skull: Normal. Negative for fracture or focal lesion. Sinuses/Orbits: Visualized paranasal sinuses are unremarkable. The visualized portions of the orbits also normal. Other: None. IMPRESSION: Subtle hypoattenuation in the anterior limb of left internal capsule and left periventricular deep white matter in the left frontal lobe. Findings may represent chronic microvascular ischemic changes with asymmetric left frontal involvement, can not exclude a recent white matter infarct in this area. MRI may be helpful for further assessment. Electronically Signed   By: Donzetta Kohut M.D.   On: 11/18/2019 14:22   CT ANGIO NECK W OR WO CONTRAST  Result Date: 11/18/2019 CLINICAL DATA:  58 year old female who presented  with right arm and leg weakness. EXAM: CT ANGIOGRAPHY HEAD AND NECK TECHNIQUE: Multidetector CT imaging of the head and neck was performed using the standard protocol during bolus administration of intravenous contrast. Multiplanar CT image reconstructions and MIPs were obtained to evaluate the vascular anatomy. Carotid stenosis measurements (when applicable) are obtained utilizing NASCET criteria, using the distal internal carotid diameter as the denominator. CONTRAST:  OMNIPAQUE IOHEXOL 350 MG/ML SOLN COMPARISON:  Plain head CT earlier today. CTA chest 08/19/2018. FINDINGS: CTA NECK Skeleton: Multilevel cervical spine degeneration. Osteopenia. No acute osseous abnormality identified. Upper chest: Chronic centrilobular emphysema. No superior mediastinal lymphadenopathy. Other neck: Subcentimeter partially calcified right thyroid nodule does not meet consensus criteria for ultrasound follow-up. No acute neck soft tissue findings. Aortic arch: 3 vessel arch configuration with mild arch atherosclerosis. Right carotid system: No brachiocephalic artery stenosis despite plaque. Negative right CCA origin. Mild soft and calcified plaque at the right ICA origin and bulb without stenosis. Left carotid system: No left CCA origin stenosis despite plaque. Mild calcified plaque at the left ICA origin, no stenosis. Vertebral arteries: Normal proximal right subclavian artery and right vertebral artery origin. Patent right vertebral artery to the skull base without plaque or stenosis. Normal proximal left subclavian artery and left vertebral artery origin. Mildly dominant left vertebral artery is patent  to the skull base without plaque or stenosis. CTA HEAD Posterior circulation: Mildly dominant left V4 segment. No distal vertebral or vertebrobasilar junction plaque or stenosis. Normal right PICA origin. The left AICA may be dominant. Patent basilar artery without stenosis. Normal SCA and PCA origins. Posterior communicating  arteries are diminutive or absent. Bilateral PCA branches are within normal limits. Anterior circulation: Patent ICA siphons. Mild calcified plaque on the left without stenosis. Mild calcified plaque on the right without stenosis. Normal ophthalmic artery origins. Patent carotid termini. Normal MCA and ACA origins. Normal anterior communicating artery. Bilateral ACA branches are within normal limits. Left MCA M1 segment and trifurcation are patent without stenosis. Left MCA branches are within normal limits. Right MCA M1 segment and bifurcation are patent without stenosis. Right MCA branches are within normal limits. Venous sinuses: Patent. Anatomic variants: Dominant left vertebral artery. Review of the MIP images confirms the above findings IMPRESSION: 1. Negative for large vessel occlusion. 2. Relatively mild for age atherosclerosis in the head and neck with no significant arterial stenosis identified. 3. Aortic Atherosclerosis (ICD10-I70.0) and Emphysema (ICD10-J43.9). 4. Mild thyroid goiter. Electronically Signed   By: Odessa Fleming M.D.   On: 11/18/2019 22:59   MR BRAIN WO CONTRAST  Result Date: 11/19/2019 CLINICAL DATA:  Initial evaluation for acute stroke, focal neural deficit. EXAM: MRI HEAD WITHOUT CONTRAST TECHNIQUE: Multiplanar, multiecho pulse sequences of the brain and surrounding structures were obtained without intravenous contrast. COMPARISON:  Prior CT from 11/18/2019. FINDINGS: Brain: Generalized age-related cerebral atrophy. Patchy and confluent T2/FLAIR hyperintensity within the periventricular deep white matter most consistent with chronic small vessel ischemic disease, moderate in nature. 5 mm focus of restricted diffusion seen involving the mid left thalamus, consistent with acute ischemic infarct. No associated hemorrhage or mass effect. No other evidence for acute or subacute ischemia. Gray-white matter differentiation otherwise maintained. No encephalomalacia to suggest chronic cortical  infarction. No foci of susceptibility artifact to suggest acute or chronic intracranial hemorrhage. No mass lesion, midline shift or mass effect. No hydrocephalus. No extra-axial fluid collection. Pituitary gland suprasellar region normal. Midline structures intact. Vascular: Major intracranial vascular flow voids are maintained. Skull and upper cervical spine: Craniocervical junction within normal limits. Bone marrow signal intensity normal. No scalp soft tissue abnormality. Sinuses/Orbits: Globes and orbital soft tissues within normal limits. Paranasal sinuses are clear. Small bilateral mastoid effusions noted, of doubtful significance. Other: None. IMPRESSION: 1. 5 mm acute ischemic nonhemorrhagic left thalamic infarct. 2. Age-related cerebral atrophy with moderate chronic small vessel ischemic disease. Electronically Signed   By: Rise Mu M.D.   On: 11/19/2019 01:14   ECHOCARDIOGRAM COMPLETE  Result Date: 11/19/2019    ECHOCARDIOGRAM REPORT   Erin Sloan Name:   XANDREA CLAREY Date of Exam: 11/19/2019 Medical Rec #:  [id number]       Height:       64.0 in Accession #:    [id number]      Weight:       115.0 lb Date of Birth:  October 12, 1960       BSA:          1.546 m Erin Sloan Age:    58 years        BP:           142/100 mmHg Erin Sloan Gender: F               HR:           80 bpm. Exam Location:  Inpatient Procedure: 2D Echo Indications:  Stroke 434.91 / I163.9  History:        Erin Sloan has no prior history of Echocardiogram examinations.                 COPD; Risk Factors:Current Smoker.  Sonographer:    Leta Jungling RDCS Referring Phys: [id number] VASUNDHRA RATHORE IMPRESSIONS  1. Left ventricular ejection fraction, by estimation, is 55 to 60%. The left ventricle has normal function. The left ventricle has no regional wall motion abnormalities. Left ventricular diastolic parameters are consistent with Grade I diastolic dysfunction (impaired relaxation).  2. Right ventricular systolic function is normal.  The right ventricular size is normal. There is normal pulmonary artery systolic pressure.  3. The mitral valve is normal in structure. Trivial mitral valve regurgitation. No evidence of mitral stenosis.  4. The aortic valve is tricuspid. Aortic valve regurgitation is not visualized. No aortic stenosis is present.  5. The inferior vena cava is normal in size with greater than 50% respiratory variability, suggesting right atrial pressure of 3 mmHg. FINDINGS  Left Ventricle: Left ventricular ejection fraction, by estimation, is 55 to 60%. The left ventricle has normal function. The left ventricle has no regional wall motion abnormalities. The left ventricular internal cavity size was normal in size. There is  no left ventricular hypertrophy. Left ventricular diastolic parameters are consistent with Grade I diastolic dysfunction (impaired relaxation). Indeterminate filling pressures. Right Ventricle: The right ventricular size is normal. No increase in right ventricular wall thickness. Right ventricular systolic function is normal. There is normal pulmonary artery systolic pressure. The tricuspid regurgitant velocity is 1.23 m/s, and  with an assumed right atrial pressure of 3 mmHg, the estimated right ventricular systolic pressure is 9.1 mmHg. Left Atrium: Left atrial size was normal in size. Right Atrium: Right atrial size was normal in size. Pericardium: There is no evidence of pericardial effusion. Mitral Valve: The mitral valve is normal in structure. Normal mobility of the mitral valve leaflets. Trivial mitral valve regurgitation. No evidence of mitral valve stenosis. Tricuspid Valve: The tricuspid valve is normal in structure. Tricuspid valve regurgitation is trivial. No evidence of tricuspid stenosis. Aortic Valve: The aortic valve is tricuspid. Aortic valve regurgitation is not visualized. No aortic stenosis is present. Pulmonic Valve: The pulmonic valve was normal in structure. Pulmonic valve regurgitation is  not visualized. No evidence of pulmonic stenosis. Aorta: The aortic root is normal in size and structure. Venous: The inferior vena cava is normal in size with greater than 50% respiratory variability, suggesting right atrial pressure of 3 mmHg. IAS/Shunts: No atrial level shunt detected by color flow Doppler.  LEFT VENTRICLE PLAX 2D LVIDd:         4.10 cm Diastology LVIDs:         2.90 cm LV e' lateral:   10.20 cm/s LV PW:         1.00 cm LV E/e' lateral: 9.4 LV IVS:        0.70 cm LV e' medial:    8.70 cm/s                        LV E/e' medial:  11.0  RIGHT VENTRICLE RV S prime:     13.50 cm/s TAPSE (M-mode): 1.9 cm LEFT ATRIUM             Index       RIGHT ATRIUM           Index LA diam:  2.50 cm 1.62 cm/m  RA Area:     11.00 cm LA Vol (A2C):   45.4 ml 29.36 ml/m RA Volume:   23.60 ml  15.26 ml/m LA Vol (A4C):   40.9 ml 26.45 ml/m LA Biplane Vol: 46.0 ml 29.75 ml/m  AORTIC VALVE LVOT Vmax:   93.20 cm/s LVOT Vmean:  56.300 cm/s LVOT VTI:    0.174 m  AORTA Ao Root diam: 2.30 cm MITRAL VALVE               TRICUSPID VALVE MV Area (PHT): 4.31 cm    TR Peak grad:   6.1 mmHg MV Decel Time: 176 msec    TR Vmax:        123.00 cm/s MV E velocity: 96.00 cm/s MV A velocity: 90.80 cm/s  SHUNTS MV E/A ratio:  1.06        Systemic VTI: 0.17 m Chilton Si MD Electronically signed by Chilton Si MD Signature Date/Time: 11/19/2019/1:30:40 PM    Final      Subjective: Eager to go home  Discharge Exam: Vitals:   11/19/19 1127 11/19/19 1601  BP: 135/86 (!) 149/81  Pulse: 71 74  Resp: 16 16  Temp: 99.1 F (37.3 C) 98.4 F (36.9 C)  SpO2: 97% 98%   Vitals:   11/19/19 0408 11/19/19 0730 11/19/19 1127 11/19/19 1601  BP: 136/83 (!) 142/100 135/86 (!) 149/81  Pulse: 72 80 71 74  Resp: 16 16 16 16   Temp: 98 F (36.7 C) 98.6 F (37 C) 99.1 F (37.3 C) 98.4 F (36.9 C)  TempSrc: Oral Oral Oral Oral  SpO2: 95% 95% 97% 98%  Weight:      Height:        General: Pt is alert, awake, not in  acute distress Cardiovascular: RRR, S1/S2 +, no rubs, no gallops Respiratory: CTA bilaterally, no wheezing, no rhonchi Abdominal: Soft, NT, ND, bowel sounds + Extremities: no edema, no cyanosis   The results of significant diagnostics from this hospitalization (including imaging, microbiology, ancillary and laboratory) are listed below for reference.     Microbiology: Recent Results (from the past 240 hour(s))  SARS CORONAVIRUS 2 (TAT 6-24 HRS) Nasopharyngeal Nasopharyngeal Swab     Status: None   Collection Time: 11/18/19  4:25 PM   Specimen: Nasopharyngeal Swab  Result Value Ref Range Status   SARS Coronavirus 2 NEGATIVE NEGATIVE Final    Comment: (NOTE) SARS-CoV-2 target nucleic acids are NOT DETECTED. The SARS-CoV-2 RNA is generally detectable in upper and lower respiratory specimens during the acute phase of infection. Negative results do not preclude SARS-CoV-2 infection, do not rule out co-infections with other pathogens, and should not be used as the sole basis for treatment or other Erin Sloan management decisions. Negative results must be combined with clinical observations, Erin Sloan history, and epidemiological information. The expected result is Negative. Fact Sheet for Patients: 01/18/20 Fact Sheet for Healthcare Providers: HairSlick.no This test is not yet approved or cleared by the quierodirigir.com FDA and  has been authorized for detection and/or diagnosis of SARS-CoV-2 by FDA under an Emergency Use Authorization (EUA). This EUA will remain  in effect (meaning this test can be used) for the duration of the COVID-19 declaration under Section 56 4(b)(1) of the Act, 21 U.S.C. section 360bbb-3(b)(1), unless the authorization is terminated or revoked sooner. Performed at [facility], [address] [place], [place]      Labs: BNP (last 3 results) No results for input(s): BNP in  the last  8760 hours. Basic Metabolic Panel: Recent Labs  Lab 11/18/19 1351  NA 137  K 4.0  CL 101  CO2 26  GLUCOSE 103*  BUN 14  CREATININE 0.56  CALCIUM 9.2   Liver Function Tests: Recent Labs  Lab 11/18/19 1351  AST 13*  ALT 14  ALKPHOS 72  BILITOT 0.5  PROT 7.4  ALBUMIN 4.2   No results for input(s): LIPASE, AMYLASE in the last 168 hours. No results for input(s): AMMONIA in the last 168 hours. CBC: Recent Labs  Lab 11/18/19 1351  WBC 5.1  NEUTROABS 2.9  HGB 15.1*  HCT 44.1  MCV 104.3*  PLT 228   Cardiac Enzymes: No results for input(s): CKTOTAL, CKMB, CKMBINDEX, TROPONINI in the last 168 hours. BNP: Invalid input(s): POCBNP CBG: Recent Labs  Lab 11/18/19 1350  GLUCAP 108*   D-Dimer No results for input(s): DDIMER in the last 72 hours. Hgb A1c Recent Labs    11/19/19 0444  HGBA1C 5.5   Lipid Profile Recent Labs    11/19/19 0444  CHOL 184  HDL 82  LDLCALC 89  TRIG 63  CHOLHDL 2.2   Thyroid function studies No results for input(s): TSH, T4TOTAL, T3FREE, THYROIDAB in the last 72 hours.  Invalid input(s): FREET3 Anemia work up No results for input(s): VITAMINB12, FOLATE, FERRITIN, TIBC, IRON, RETICCTPCT in the last 72 hours. Urinalysis    Component Value Date/Time   COLORURINE STRAW (A) 11/18/2019 1500   APPEARANCEUR CLEAR 11/18/2019 1500   LABSPEC 1.010 11/18/2019 1500   PHURINE 6.0 11/18/2019 1500   GLUCOSEU NEGATIVE 11/18/2019 1500   HGBUR NEGATIVE 11/18/2019 1500   HGBUR negative 02/14/2010 1049   BILIRUBINUR NEGATIVE 11/18/2019 1500   KETONESUR NEGATIVE 11/18/2019 1500   PROTEINUR NEGATIVE 11/18/2019 1500   UROBILINOGEN 0.2 02/14/2010 1049   NITRITE NEGATIVE 11/18/2019 1500   LEUKOCYTESUR NEGATIVE 11/18/2019 1500   Sepsis Labs Invalid input(s): PROCALCITONIN,  WBC,  LACTICIDVEN Microbiology Recent Results (from the past 240 hour(s))  SARS CORONAVIRUS 2 (TAT 6-24 HRS) Nasopharyngeal Nasopharyngeal Swab     Status: None    Collection Time: 11/18/19  4:25 PM   Specimen: Nasopharyngeal Swab  Result Value Ref Range Status   SARS Coronavirus 2 NEGATIVE NEGATIVE Final    Comment: (NOTE) SARS-CoV-2 target nucleic acids are NOT DETECTED. The SARS-CoV-2 RNA is generally detectable in upper and lower respiratory specimens during the acute phase of infection. Negative results do not preclude SARS-CoV-2 infection, do not rule out co-infections with other pathogens, and should not be used as the sole basis for treatment or other Erin Sloan management decisions. Negative results must be combined with clinical observations, Erin Sloan history, and epidemiological information. The expected result is Negative. Fact Sheet for Patients: HairSlick.nohttps://www.fda.gov/media/138098/download Fact Sheet for Healthcare Providers: quierodirigir.comhttps://www.fda.gov/media/138095/download This test is not yet approved or cleared by the [place] FDA and  has been authorized for detection and/or diagnosis of SARS-CoV-2 by FDA under an Emergency Use Authorization (EUA). This EUA will remain  in effect (meaning this test can be used) for the duration of the COVID-19 declaration under Section 56 4(b)(1) of the Act, 21 U.S.C. section 360bbb-3(b)(1), unless the authorization is terminated or revoked sooner. Performed at [facility], [address] [place], [place] [address]    Time spent: 30 min  SIGNED:   Rickey BarbaraStephen Chrsitopher Wik, MD  [facility] 11/19/2019, 6:08 PM  If 7PM-7AM, please contact night-coverage

## 2019-11-19 NOTE — Evaluation (Signed)
Physical Therapy Evaluation Patient Details Name: Erin Sloan MRN: [id number] DOB: 1961/02/02 Today's Date: 11/19/2019   History of Present Illness  Erin Sloan is a 59 y.o. female past medical history of COPD, current tobacco smoker since the age of 59 years old, history of headaches, presenting to  for evaluation of right-sided numbness and weakness. MRI with 5 mm acute ischemic nonhemorrhagic left thalamic infarct.  Clinical Impression  Patient evaluated by Physical Therapy with no further acute PT needs identified. 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 Pt denying residual symptoms. Ambulating hallway distances and negotiated half flight of steps independently. Scoring 22/24 on the Dynamic Gait Index indicating she is not at risk for falls. Education provided regarding BEFAST stroke symptoms. All education has been completed and the patient has no further questions. No follow-up Physical Therapy or equipment needs. PT is signing off. Thank you for this referral.  BP pre mobility: 167/93 BP post mobility: 167/98     Follow Up Recommendations No PT follow up    Equipment Recommendations  None recommended by PT    Recommendations for Other Services       Precautions / Restrictions Precautions Precautions: None Restrictions Weight Bearing Restrictions: No      Mobility  Bed Mobility Overal bed mobility: Independent                Transfers Overall transfer level: Independent Equipment used: None                Ambulation/Gait Ambulation/Gait assistance: Independent Gait Distance (Feet): 500 Feet Assistive device: None Gait Pattern/deviations: WFL(Within Functional Limits)        Stairs Stairs: Yes Stairs assistance: Modified independent (Device/Increase time) Stair Management: One rail Right Number of Stairs: 10 General stair comments: step over step pattern  Wheelchair Mobility    Modified Rankin  (Stroke Patients Only) Modified Rankin (Stroke Patients Only) Pre-Morbid Rankin Score: No symptoms Modified Rankin: No symptoms     Balance Overall balance assessment: Independent                               Standardized Balance Assessment Standardized Balance Assessment : Dynamic Gait Index   Dynamic Gait Index Level Surface: Normal Change in Gait Speed: Normal Gait with Horizontal Head Turns: Normal Gait with Vertical Head Turns: Normal Gait and Pivot Turn: Normal Step Over Obstacle: Mild Impairment Step Around Obstacles: Normal Steps: Mild Impairment Total Score: 22       Pertinent Vitals/Pain Pain Assessment: No/denies pain    Home Living Family/patient expects to be discharged to:: Private residence Living Arrangements: Children(daughter) Available Help at Discharge: Family Type of Home: House Home Access: Level entry(through garage)     Home Layout: Two level        Prior Function Level of Independence: Independent         Comments: Location manager, 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     Hand Dominance   Dominant Hand: Right    Extremity/Trunk Assessment   Upper Extremity Assessment Upper Extremity Assessment: Defer to OT evaluation    Lower Extremity Assessment Lower Extremity Assessment: Overall WFL for tasks assessed    Cervical / Trunk Assessment Cervical / Trunk Assessment: Other exceptions Cervical / Trunk Exceptions: Rounded shoulders  Communication   Communication: No difficulties  Cognition Arousal/Alertness: Awake/alert Behavior During Therapy: WFL for tasks assessed/performed Overall Cognitive Status: Within Functional  Limits for tasks assessed                                        General Comments      Exercises     Assessment/Plan    PT Assessment Patent does not need any further PT services  PT Problem List Impaired sensation       PT Treatment Interventions      PT Goals (Current  goals can be found in the Care Plan section)  Acute Rehab PT Goals Patient Stated Goal: "stop smoking." PT Goal Formulation: All assessment and education complete, DC therapy    Frequency     Barriers to discharge        Co-evaluation               AM-PAC PT "6 Clicks" Mobility  Outcome Measure Help needed turning from your back to your side while in a flat bed without using bedrails?: None Help needed moving from lying on your back to sitting on the side of a flat bed without using bedrails?: None Help needed moving to and from a bed to a chair (including a wheelchair)?: None Help needed standing up from a chair using your arms (e.g., wheelchair or bedside chair)?: None Help needed to walk in hospital room?: None Help needed climbing 3-5 steps with a railing? : None 6 Click Score: 24    End of Session   Activity Tolerance: Patient tolerated treatment well Patient left: in bed;with call bell/phone within reach Nurse Communication: Mobility status PT Visit Diagnosis: Other symptoms and signs involving the nervous system (R29.898)    Time: 6213-0865 PT Time Calculation (min) (ACUTE ONLY): 16 min   Charges:   PT Evaluation $PT Eval Low Complexity: 1 Low            Wyona Almas, PT, DPT Acute Rehabilitation Services Pager [phone] Office [phone]   Deno Etienne 11/19/2019, 1:16 PM

## 2019-11-20 ENCOUNTER — Telehealth: Payer: Self-pay | Admitting: *Deleted

## 2019-11-20 NOTE — Telephone Encounter (Signed)
Unable to reach pt. 1st attempt.  

## 2019-11-23 NOTE — Telephone Encounter (Signed)
I you would try one more time and document final effort.

## 2019-11-23 NOTE — Telephone Encounter (Signed)
I have made two attempts and have been unable to reach patient. Pt has hospital follow up scheduled w/ PCP 12/03/19.   

## 2019-11-23 NOTE — Telephone Encounter (Signed)
Transition Care Management Follow-up Telephone Call   Date discharged? 11/19/19   How have you been since you were released from the hospital? Doing well   Do you understand why you were in the hospital? yes   Do you understand the discharge instructions? yes   Where were you discharged to? Home. dtr is w/her   Items Reviewed:  Medications reviewed: yes  Allergies reviewed: yes  Dietary changes reviewed: yes  Referrals reviewed: yes   Functional Questionnaire:   Activities of Daily Living (ADLs):   She states they are independent in the following: ambulation, bathing and hygiene, feeding, continence, grooming, toileting and dressing States they require assistance with the following: na   Any transportation issues/concerns?: no   Any patient concerns? no   Confirmed importance and date/time of follow-up visits scheduled yes  Provider Appointment booked with PCP 12/03/19  Confirmed with patient if condition begins to worsen call PCP or go to the ER.  Patient was given the office number and encouraged to call back with question or concerns.  : yes

## 2019-11-26 ENCOUNTER — Other Ambulatory Visit: Payer: Self-pay

## 2019-11-26 ENCOUNTER — Ambulatory Visit (INDEPENDENT_AMBULATORY_CARE_PROVIDER_SITE_OTHER): Payer: 59 | Admitting: Medical

## 2019-11-26 DIAGNOSIS — R269 Unspecified abnormalities of gait and mobility: Secondary | ICD-10-CM | POA: Diagnosis not present

## 2019-11-26 DIAGNOSIS — F17200 Nicotine dependence, unspecified, uncomplicated: Secondary | ICD-10-CM | POA: Diagnosis not present

## 2019-11-26 DIAGNOSIS — R279 Unspecified lack of coordination: Secondary | ICD-10-CM

## 2019-11-26 DIAGNOSIS — I6930 Unspecified sequelae of cerebral infarction: Secondary | ICD-10-CM | POA: Diagnosis not present

## 2019-11-26 DIAGNOSIS — R29898 Other symptoms and signs involving the musculoskeletal system: Secondary | ICD-10-CM | POA: Diagnosis not present

## 2019-11-26 DIAGNOSIS — I69398 Other sequelae of cerebral infarction: Secondary | ICD-10-CM

## 2019-11-26 NOTE — Patient Instructions (Addendum)
Continue plavix and aspirin as neuro directed.  Check bp and pulse update me later today or tomorrow at [facility].  Attend OT to help with recovery. Referral place.   Filled fmla forms.   Stop smoking completely. Send new patch rx.  Follow up with neuro as scheduled and with me next Thursday.

## 2019-11-26 NOTE — Progress Notes (Addendum)
Subjective:    Patient ID: Erin Sloan, female    DOB: 10/26/1960, 59 y.o.   MRN: [id number]  HPI Virutal telephone visit.  Location: Patient: home Provider: office   I discussed the limitations of evaluation and management by telemedicine and the availability of in person appointments. The patient expressed understanding and agreed to proceed.   Follow Up Instructions:    I discussed the assessment and treatment plan with the patient. The patient was provided an opportunity to ask questions and all were answered. The patient agreed with the plan and demonstrated an understanding of the instructions.   The patient was advised to call back or seek an in-person evaluation if the symptoms worsen or if the condition fails to improve as anticipated.  I provided 30  minutes of non-face-to-face time during this encounter.   [name], PA-C     Pt in for follow up.  Pt was hospitalized for one day. Admit date 11-18-19 and dc date 11-19-19.  60 y.o.femalewith medical history significant ofCOPD, tobacco use, depression presenting to the ED from her PCPs office for evaluation of right-sided weakness and numbness.Patient states yesterday evening 3/9 around 1915 she started experiencing numbness of her lip and cheek on the right side. She then woke up around 2 in the morning and noticed that her right arm was numb and weak. She went back to sleep and in the morning when she went to work she noticed that her right leg was numb. She was able to walk without any difficulty but did have difficulty using her right arm. She did not have any slurring of speech or drooping of her face. Denies history of prior strokes. She has been smoking cigarettes since the age of 48, previously up to 2 packs a day but has now cut down to 1 pack a day. She took [place] powder (aspirin 520 mg) at home prior to her ED arrival  DC dx.  Right-sided weakness and numbness,acute stroke  suspected: -Head CT showing subtle hypoattenuation in the anterior limb of the left internal capsule and left periventricular deep white matter in the left frontal lobe. Findings may represent chronic microvascular ischemic changes with asymmetric left frontal involvement, cannot exclude recent white matter infarct in this area.  -Brain MRI reviewed, noted to have 46mm acute ischemic nonhemorrhagic L thalamic infarct -Neurology consulted and had been following -Stroke w/u completed. Discussed with Neurologist who recommended 3 weeks dual antiplatelet regimen of ASA 81mg  and plavix 75mg  x 3 weeks followed by ASA 81mg  monotherapy afterwards -Pt to follow up with Neurology as outpatient -Cleared by PT/OT/SLP -Continued on 80mg  atorvastatin   Pt still has some rt upper extremity decreased grip strength and feeling that not coordinated.   Also still has slight off balance on ambulation. These are same finding as when I recommended ED evaluation.  Pt using nicotine patches and smoking 2-4 cigarettes now. Was smoking a pack a day.  Pt works as . 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  Expresses can't do that presently due to condition post stroke.  Pt has appointment for follow up with neurologist  12-22-2019. Pt states was not scheduled for OT.  Day of cva. 11-18-19.   Review of Systems  Constitutional: Negative for chills, fatigue and fever.  Respiratory: Negative for cough, chest tightness, shortness of breath and wheezing.   Cardiovascular: Negative for chest pain and palpitations.  Gastrointestinal: Negative for abdominal pain.  Musculoskeletal: Negative  for back pain.  Skin: Negative for rash.  Neurological: Negative for dizziness, syncope, speech difficulty, weakness, light-headedness, numbness and headaches.       See hpi only.  Hematological: Negative for adenopathy. Does not bruise/bleed easily.   Psychiatric/Behavioral: Negative for behavioral problems, decreased concentration and dysphoric mood.   Past Medical History:  Diagnosis Date  . COPD (chronic obstructive pulmonary disease) (HCC)   . Depression   . Head ache      Social History   Socioeconomic History  . Marital status: Widowed    Spouse name: Not on file  . Number of children: Not on file  . Years of education: Not on file  . Highest education level: Not on file  Occupational History  . Not on file  Tobacco Use  . Smoking status: Current Every Day Smoker    Packs/day: 1.50    Years: 48.00    Pack years: 72.00    Types: Cigarettes  . Smokeless tobacco: Never Used  Substance and Sexual Activity  . Alcohol use: Yes    Comment: socially. moderate now.  years ago drank heavier on weekends.  . Drug use: No  . Sexual activity: Not Currently  Other Topics Concern  . Not on file  Social History Narrative  . Not on file   Social Determinants of Health   Financial Resource Strain:   . Difficulty of Paying Living Expenses:   Food Insecurity:   . Worried About Programme researcher, broadcasting/film/video in the Last Year:   . Barista in the Last Year:   Transportation Needs:   . Freight forwarder (Medical):   Marland Kitchen Lack of Transportation (Non-Medical):   Physical Activity:   . Days of Exercise per Week:   . Minutes of Exercise per Session:   Stress:   . Feeling of Stress :   Social Connections:   . Frequency of Communication with Friends and Family:   . Frequency of Social Gatherings with Friends and Family:   . Attends Religious Services:   . Active Member of Clubs or Organizations:   . Attends Banker Meetings:   Marland Kitchen Marital Status:   Intimate Partner Violence:   . Fear of Current or Ex-Partner:   . Emotionally Abused:   Marland Kitchen Physically Abused:   . Sexually Abused:     Past Surgical History:  Procedure Laterality Date  . CARPAL TUNNEL RELEASE    . CESAREAN SECTION     1985  . CESAREAN SECTION      1987    Family History  Problem Relation Age of Onset  . Pancreatic cancer Mother   . Heart disease Father   . Heart attack Father     Allergies  Allergen Reactions  . Ibuprofen Swelling  . Amoxicillin Hives    Has patient had a PCN reaction causing immediate rash, facial/tongue/throat swelling, SOB or lightheadedness with hypotension: No Has patient had a PCN reaction causing severe rash involving mucus membranes or skin necrosis: No Has patient had a PCN reaction that required hospitalization: No Has patient had a PCN reaction occurring within the last 10 years: No If all of the above answers are "NO", then may proceed with Cephalosporin use.\    Current Outpatient Medications on File Prior to Visit  Medication Sig Dispense Refill  . albuterol (VENTOLIN HFA) 108 (90 Base) MCG/ACT inhaler Inhale 2 puffs into the lungs every 6 (six) hours as needed for wheezing or shortness of breath. 6.7 g 3  .  aspirin EC 81 MG EC tablet Take 1 tablet (81 mg total) by mouth daily. 30 tablet 0  . atorvastatin (LIPITOR) 80 MG tablet Take 1 tablet (80 mg total) by mouth daily at 6 PM. 30 tablet 0  . clopidogrel (PLAVIX) 75 MG tablet Take 1 tablet (75 mg total) by mouth daily for 21 days. 21 tablet 0  . MELATONIN PO Take 1 tablet by mouth at bedtime.    . nicotine (NICODERM CQ - DOSED IN MG/24 HOURS) 21 mg/24hr patch PLACE 1 PATCH (21 MG TOTAL) ONTO THE SKIN DAILY. 28 patch 0  . umeclidinium-vilanterol (ANORO ELLIPTA) 62.5-25 MCG/INH AEPB TAKE 1 PUFF BY MOUTH EVERY DAY (Patient taking differently: Inhale 1 puff into the lungs daily. ) 60 each 3   No current facility-administered medications on file prior to visit.    There were no vitals taken for this visit.      Objective:   Physical Exam  General-- no acute distress, pleasant, alert, oriented and normal speech.      Assessment & Plan:  Continue plavix and aspirin as neuro directed.  Check bp and pulse update me later today or  tomorrow at [facility].  Attend OT to help with recovery. Referral place.   Filled fmla forms.   Stop smoking completely. Send new patch rx.  Follow up with neuro as scheduled and with me next Thursday  35 minutes spent with pt today.

## 2019-12-01 ENCOUNTER — Ambulatory Visit (INDEPENDENT_AMBULATORY_CARE_PROVIDER_SITE_OTHER): Payer: 59 | Admitting: Rehabilitative and Restorative Service Providers"

## 2019-12-01 ENCOUNTER — Telehealth: Payer: Self-pay | Admitting: Medical

## 2019-12-01 ENCOUNTER — Other Ambulatory Visit: Payer: Self-pay

## 2019-12-01 DIAGNOSIS — R29818 Other symptoms and signs involving the nervous system: Secondary | ICD-10-CM | POA: Diagnosis not present

## 2019-12-01 DIAGNOSIS — M6281 Muscle weakness (generalized): Secondary | ICD-10-CM

## 2019-12-01 DIAGNOSIS — R293 Abnormal posture: Secondary | ICD-10-CM | POA: Diagnosis not present

## 2019-12-01 DIAGNOSIS — R2689 Other abnormalities of gait and mobility: Secondary | ICD-10-CM | POA: Diagnosis not present

## 2019-12-01 NOTE — Patient Instructions (Signed)
Access Code: [email or web address] URL: [email or web address] Date: 12/01/2019 Prepared by: Margretta Ditty  Exercises Sit to Stand - 2 x daily - 7 x weekly - 1 sets - 10 reps Step Up - 2 x daily - 7 x weekly - 1 sets - 10 reps Heel Toe Raises with Counter Support - 2 x daily - 7 x weekly - 1 sets - 10 reps Single Leg Stance - 2 x daily - 7 x weekly - 1 sets - 3 reps - 10 secondsne hold Shoulder Overhead Press in Flexion with Dumbbells - 2 x daily - 7 x weekly - 1 sets - 10 reps Putty Squeezes - 2 x daily - 7 x weekly - 1 sets - 10 reps

## 2019-12-01 NOTE — Therapy (Signed)
[facility] [address] [place], [place], [address] Phone: [phone]   Fax:  [phone]  Physical Therapy Evaluation  Patient Details  Name: Erin Sloan MRN: [id number] Date of Birth: 01/27/1961 Referring Provider (PT): [name], [place]   Encounter Date: 12/01/2019  PT End of Session - 12/01/19 1225    Visit Number  1    Number of Visits  6    Date for PT Re-Evaluation  01/12/20    PT Start Time  1140    PT Stop Time  1225    PT Time Calculation (min)  45 min    Activity Tolerance  Patient tolerated treatment well    Behavior During Therapy  [facility] for tasks assessed/performed       Past Medical History:  Diagnosis Date  . COPD (chronic obstructive pulmonary disease) (HCC)   . Depression   . Head ache     Past Surgical History:  Procedure Laterality Date  . CARPAL TUNNEL RELEASE    . CESAREAN SECTION     1985  . CESAREAN SECTION     1987    There were no vitals filed for this visit.   Subjective Assessment - 12/01/19 1145    Subjective  The patient was admitted to [facility] on 11/18/19 due to R side weakness and d/c home on 11/20/19.  She is referred for gait disturbance and dec'd R grip strength.    Pertinent History  arhtirtis, COPD    Patient Stated Goals  be able to return to full strength.    Currently in Pain?  No/denies         [facility] PT Assessment - 12/01/19 1147      Assessment   Medical Diagnosis  gait disturbance, historyof CVA with residual deficit    Referring Provider (PT)  [name], PA-C    Onset Date/Surgical Date  11/18/19    Hand Dominance  Right    Prior Therapy  Acute       Precautions   Precautions  None      Restrictions   Weight Bearing Restrictions  No      Balance Screen   Has the patient fallen in the past 6 months  No    Has the patient had a decrease in activity level because of a fear of falling?   No    Is the patient reluctant to leave  their home because of a fear of falling?   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; folding inserts for pharmaceutical      Sensation   Light Touch  Appears Intact      Posture/Postural Control   Posture/Postural Control  Postural limitations    Postural Limitations  Forward head;Increased thoracic kyphosis;Rounded Shoulders      ROM / Strength   AROM / PROM / Strength  AROM;Strength      AROM   Overall AROM   Within functional  limits for tasks performed      Strength   Overall Strength  Deficits    Strength Assessment Site  Shoulder;Hip;Knee;Ankle;Elbow;Hand    Right/Left Shoulder  Right;Left    Right Shoulder Flexion  4/5    Right Shoulder ABduction  4/5    Left Shoulder Flexion  4+/5    Left Shoulder ABduction  4+/5    Right/Left Elbow  Right;Left    Right Elbow Flexion  5/5    Right Elbow Extension  5/5    Left Elbow Flexion  5/5    Left Elbow Extension  5/5    Right/Left hand  Right;Left    Right Hand Grip (lbs)  38    Right Hand Lateral Pinch  6 lbs    Left Hand Grip (lbs)  48    Left Hand Lateral Pinch  6 lbs    Right/Left Hip  Right;Left    Right Hip Flexion  3/5    Left Hip Flexion  4+/5    Right/Left Knee  Right;Left    Right Knee Flexion  5/5    Right Knee Extension  5/5    Left Knee Flexion  5/5    Left Knee Extension  5/5    Right/Left Ankle  Right;Left    Right Ankle Dorsiflexion  4-/5    Left Ankle Dorsiflexion  4-/5      Transfers   Transfers  Sit to Stand    Sit to Stand  7: Independent      Ambulation/Gait   Ambulation/Gait  Yes    Ambulation/Gait Assistance  7: Independent     Ambulation Distance (Feet)  150 Feet    Assistive device  None    Gait Pattern  --   note mild dec'd eccentric control ant tibialis   Ambulation Surface  Level;Indoor    Gait velocity  2.68 ft/sec    Stairs  Yes    Stairs Assistance  6: Modified independent (Device/Increase time)    Stair Management Technique  Two rails;Step to pattern   descends with step to pattern   Number of Stairs  6      Balance   Balance Assessed  Yes      Static Standing Balance   Static Standing - Comment/# of Minutes  Single leg stance x 10 seconds R and L with some mild postural correction.      Functional Gait  Assessment   Gait assessed   Yes    Gait Level Surface  Walks 20 ft in less than 7 sec but greater than 5.5 sec, uses assistive device, slower speed, mild gait deviations, or deviates 6-10 in outside of the 12 in walkway width.    Change in Gait Speed  Able to smoothly change walking speed without loss of balance or gait deviation. Deviate no more than 6 in outside of the 12 in walkway width.    Gait with Horizontal Head Turns  Performs head turns smoothly with no change in gait. Deviates no more than 6 in outside 12 in walkway width    Gait with Vertical Head Turns  Performs head turns with no change in gait. Deviates no more than 6 in outside 12 in walkway width.    Gait and Pivot Turn  Pivot turns safely within 3 sec and stops quickly with no loss of balance.    Step Over Obstacle  Is able to step over 2 stacked shoe boxes taped together (9 in total height) without changing gait speed. No  evidence of imbalance.    Gait with Narrow Base of Support  Ambulates 4-7 steps.    Gait with Eyes Closed  Walks 20 ft, slow speed, abnormal gait pattern, evidence for imbalance, deviates 10-15 in outside 12 in walkway width. Requires more than 9 sec to ambulate 20 ft.    Ambulating Backwards  Walks 20 ft, uses assistive device, slower speed, mild gait deviations, deviates 6-10 in outside 12 in walkway width.     Steps  Alternating feet, must use rail.    Total Score  23    FGA comment:  23/30                 Objective measurements completed on examination: See above findings.      [facility] Adult PT Treatment/Exercise - 12/01/19 1147      Exercises   Exercises  Other Exercises    Other Exercises   Sit<>stand x 10 reps, step ups x 10 reps R and L with one UE support, toe raises x 10 reps, grip strength and overhead press up with 1 lb.             PT Education - 12/01/19 1223    Education Details  HEP    Person(s) Educated  Patient    Methods  Explanation;Demonstration;Handout    Comprehension  Verbalized understanding;Returned demonstration          PT Long Term Goals - 12/01/19 1230      PT LONG TERM GOAL #1   Title  The patient will be indep with HEP for LE strength, R sided grip strength, and general mobility.    Time  6    Period  Weeks    Target Date  01/12/20      PT LONG TERM GOAL #2   Title  The patient will improve FGA from 23/30 to > or equal to 26/30 to demo improved functional mobility.    Time  6    Period  Weeks    Target Date  01/12/20      PT LONG TERM GOAL #3   Title  The patient will negotiate steps without handrails with a reciprocal pattern.    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  She presents with strength impairments bilateral shoulder flexion/abduction, R hip flexion, and bilateral ankle DF.  She has mild decrease in R grip strength to 38 lbs compared to 48 on the L side.  She notes "feels funny" in R side and reports dec'd confidence descending steps.  PT to address deficits to return to prior level of function.    Personal Factors and Comorbidities  Comorbidity 1    Comorbidities  HTN    Examination-Activity Limitations  Stairs;Squat    Examination-Participation Restrictions   Community Activity    Stability/Clinical Decision Making  Stable/Uncomplicated    Clinical Decision Making  Low    Rehab Potential  Good    PT Frequency  2x / week    PT Duration  6 weeks    PT Treatment/Interventions  ADLs/Self Care Home Management;Gait training;Stair training;Functional mobility training;Therapeutic activities;Therapeutic exercise;Balance training;Neuromuscular re-education;Manual techniques;Taping;Patient/family education    PT Next Visit Plan  progress HEP    PT Home  Exercise Plan  Access Code: [email or web address]    Consulted and Agree with Plan of Care  Patient       Patient will benefit from skilled therapeutic intervention in order to improve the following deficits and impairments:  Decreased strength, Postural dysfunction, Impaired sensation, Decreased balance, Decreased activity tolerance  Visit Diagnosis: Other abnormalities of gait and mobility  Muscle weakness (generalized)  Abnormal posture  Other symptoms and signs involving the nervous system     Problem List Patient Active Problem List   Diagnosis Date Noted  . CVA (cerebrovascular accident) (HCC) 11/18/2019  . Pulmonary nodules 10/10/2018  . Allergic reaction 08/20/2018  . Alcohol use 08/20/2018  . COPD (chronic obstructive pulmonary disease) (HCC) 08/20/2018  . Acute allergic reaction   . Tobacco use 08/19/2018  . Acute bronchitis 08/19/2018  . Hypokalemia 08/19/2018  . WRIST PAIN, BILATERAL 02/14/2010  . DYSURIA 02/14/2010  . SINUSITIS - ACUTE-NOS 12/12/2009    Erin Sloan, PT 12/01/2019, 12:37 PM  [facility] [address] [place], [place], [address] Phone: [phone]   Fax:  [phone]  Name: Erin Sloan MRN: [id number] Date of Birth: 1960-12-17

## 2019-12-01 NOTE — Telephone Encounter (Signed)
Patient is requesting that we fax FMLA  Paperwork back to [employer] .  Fax number is on FMLA Paper

## 2019-12-01 NOTE — Telephone Encounter (Signed)
FMLA paperwork faxed

## 2019-12-02 ENCOUNTER — Other Ambulatory Visit: Payer: Self-pay

## 2019-12-03 ENCOUNTER — Other Ambulatory Visit: Payer: Self-pay

## 2019-12-03 ENCOUNTER — Ambulatory Visit: Payer: 59 | Admitting: Medical

## 2019-12-03 ENCOUNTER — Encounter: Payer: Self-pay | Admitting: Medical

## 2019-12-03 VITALS — BP 140/70 | HR 91 | Temp 96.7°F | Resp 18 | Ht 64.0 in | Wt 116.8 lb

## 2019-12-03 DIAGNOSIS — F17200 Nicotine dependence, unspecified, uncomplicated: Secondary | ICD-10-CM

## 2019-12-03 DIAGNOSIS — I10 Essential (primary) hypertension: Secondary | ICD-10-CM | POA: Diagnosis not present

## 2019-12-03 DIAGNOSIS — I6930 Unspecified sequelae of cerebral infarction: Secondary | ICD-10-CM | POA: Diagnosis not present

## 2019-12-03 NOTE — Progress Notes (Signed)
Subjective:    Patient ID: Erin Sloan, female    DOB: November 12, 1960, 59 y.o.   MRN: [id number]  HPI  Pt in for follow up.  Pt went to PT on Tuesday for first time. She is going to do PT every 2 weeks since copay is $40.  Pt states she feels some improvement of rt side extremity strength,  She states feels slightly more forgetful since stroke.   Pt lipids 2 weeks ago looked good  Pt used to smoke 1.5 pack a day past 48 years. Now just 1 cigarette at most a day.  Pt is following up with neurologist on December 22, 2019.  Pt is on Aspirin 81 mg and plavix. Also on Atorvastatin.       Review of Systems  Constitutional: Negative for chills, fatigue and fever.  HENT: Negative for congestion, facial swelling and postnasal drip.   Respiratory: Negative for cough, chest tightness, shortness of breath and wheezing.   Cardiovascular: Negative for chest pain and palpitations.  Gastrointestinal: Negative for abdominal pain.  Musculoskeletal: Negative for back pain.  Skin: Negative for rash.  Neurological: Negative for dizziness, syncope and headaches.       At times mild forgetful.  Hematological: Negative for adenopathy. Does not bruise/bleed easily.  Psychiatric/Behavioral: Negative for behavioral problems and decreased concentration.    Past Medical History:  Diagnosis Date  . COPD (chronic obstructive pulmonary disease) ([place])   . Depression   . Head ache      Social History   Socioeconomic History  . Marital status: Widowed    Spouse name: Not on file  . Number of children: Not on file  . Years of education: Not on file  . Highest education level: Not on file  Occupational History  . Not on file  Tobacco Use  . Smoking status: Current Every Day Smoker    Packs/day: 1.50    Years: 48.00    Pack years: 72.00    Types: Cigarettes  . Smokeless tobacco: Never Used  Substance and Sexual Activity  . Alcohol use: Yes    Comment: socially. moderate now.  years ago  drank heavier on weekends.  . Drug use: No  . Sexual activity: Not Currently  Other Topics Concern  . Not on file  Social History Narrative  . Not on file   Social Determinants of Health   Financial Resource Strain:   . Difficulty of Paying Living Expenses:   Food Insecurity:   . Worried About Charity fundraiser in the Last Year:   . Arboriculturist in the Last Year:   Transportation Needs:   . Film/video editor (Medical):   Marland Kitchen Lack of Transportation (Non-Medical):   Physical Activity:   . Days of Exercise per Week:   . Minutes of Exercise per Session:   Stress:   . Feeling of Stress :   Social Connections:   . Frequency of Communication with Friends and Family:   . Frequency of Social Gatherings with Friends and Family:   . Attends Religious Services:   . Active Member of Clubs or Organizations:   . Attends Archivist Meetings:   Marland Kitchen Marital Status:   Intimate Partner Violence:   . Fear of Current or Ex-Partner:   . Emotionally Abused:   Marland Kitchen Physically Abused:   . Sexually Abused:     Past Surgical History:  Procedure Laterality Date  . CARPAL TUNNEL RELEASE    . CESAREAN SECTION  1985  . CESAREAN SECTION     1987    Family History  Problem Relation Age of Onset  . Pancreatic cancer Mother   . Heart disease Father   . Heart attack Father     Allergies  Allergen Reactions  . Ibuprofen Swelling  . Amoxicillin Hives    Has patient had a PCN reaction causing immediate rash, facial/tongue/throat swelling, SOB or lightheadedness with hypotension: No Has patient had a PCN reaction causing severe rash involving mucus membranes or skin necrosis: No Has patient had a PCN reaction that required hospitalization: No Has patient had a PCN reaction occurring within the last 10 years: No If all of the above answers are "NO", then may proceed with Cephalosporin use.\    Current Outpatient Medications on File Prior to Visit  Medication Sig Dispense Refill   . albuterol (VENTOLIN HFA) 108 (90 Base) MCG/ACT inhaler Inhale 2 puffs into the lungs every 6 (six) hours as needed for wheezing or shortness of breath. 6.7 g 3  . aspirin EC 81 MG EC tablet Take 1 tablet (81 mg total) by mouth daily. 30 tablet 0  . atorvastatin (LIPITOR) 80 MG tablet Take 1 tablet (80 mg total) by mouth daily at 6 PM. 30 tablet 0  . clopidogrel (PLAVIX) 75 MG tablet Take 1 tablet (75 mg total) by mouth daily for 21 days. 21 tablet 0  . MELATONIN PO Take 1 tablet by mouth at bedtime.    . nicotine (NICODERM CQ - DOSED IN MG/24 HOURS) 21 mg/24hr patch PLACE 1 PATCH (21 MG TOTAL) ONTO THE SKIN DAILY. 28 patch 0  . umeclidinium-vilanterol (ANORO ELLIPTA) 62.5-25 MCG/INH AEPB TAKE 1 PUFF BY MOUTH EVERY DAY (Patient taking differently: Inhale 1 puff into the lungs daily. ) 60 each 3   No current facility-administered medications on file prior to visit.    BP (!) 150/82 (BP Location: Left Arm, Patient Position: Sitting, Cuff Size: Normal)   Pulse 91   Temp (!) 96.7 F (35.9 C) (Temporal)   Resp 18   Ht 5\' 4"  (1.626 m)   Wt 116 lb 12.8 oz (53 kg)   SpO2 100%   BMI 20.05 kg/m       Objective:   Physical Exam  General Mental Status- Alert. General Appearance- Not in acute distress.   Skin General: Color- Normal Color. Moisture- Normal Moisture.  Neck Carotid Arteries- Normal color. Moisture- Normal Moisture. No carotid bruits. No JVD.  Chest and Lung Exam Auscultation: Breath Sounds:-Normal.  Cardiovascular Auscultation:Rythm- Regular. Murmurs & Other Heart Sounds:Auscultation of the heart reveals- No Murmurs.  Abdomen Inspection:-Inspeection Normal. Palpation/Percussion:Note:No mass. Palpation and Percussion of the abdomen reveal- Non Tender, Non Distended + BS, no rebound or guarding.    Neurologic Cranial Nerve exam:- CN III-XII intact(No nystagmus), symmetric smile. Drift Test:- No drift. Finger to Nose:- Normal/Intact Strength:- 5/5 equal and  symmetric strength both upper and lower extremities. Grip strength left side feels improved today.       Assessment & Plan:  You have history of stroke and recommend continue PT. I think your rt hand grip strength has improved.  I want you to check your bp daily and you may need to be on bp med if bp above 140/90. Amlodipine may be good choice as you report some raynauds type symptoms with cold weather. Rx for bp cuff given today.  Also recommend stop smoking completley. Good job with progress so are.  Follow up with neurologist December 22, 2019.  With me follow up one month or as needed  Time spent with patient today was 20  minutes which consisted of chart revdiew, discussing diagnosis,  Treatment, plan  and documentation.  [name], PA-C

## 2019-12-03 NOTE — Patient Instructions (Addendum)
You have history of stroke and recommend continue PT. I think your rt hand grip strength has improved.  I want you to check your bp daily and you may need to be on bp med if bp above 140/90. Amlodipine may be good choice as you report some raynauds type symptoms with cold weather. Rx for bp cuff given today.  Also recommend stop smoking completley. Good job with progress so are.  Follow up with neurologist December 22, 2019.  With me follow up one month or as needed

## 2019-12-14 ENCOUNTER — Telehealth: Payer: Self-pay

## 2019-12-14 MED ORDER — ASPIRIN 81 MG PO TBEC: 81.0000 mg | DELAYED_RELEASE_TABLET | Freq: Every day | ORAL | 0 refills | Status: DC

## 2019-12-14 MED ORDER — ATORVASTATIN CALCIUM 80 MG PO TABS: 80.0000 mg | ORAL_TABLET | Freq: Every day | ORAL | 0 refills | Status: DC

## 2019-12-14 NOTE — Telephone Encounter (Signed)
Medication sent.

## 2019-12-14 NOTE — Telephone Encounter (Signed)
Patient called in to see if PA [name] could send in a prescription for aspirin EC 81 MG EC tablet [[id number]]   atorvastatin (LIPITOR) 80 MG tablet [[id number]]    Please send it to [id number] - [place], [place] - [address]  [address] [place], [place] [address]  Phone:  [phone] Fax:  [phone]

## 2019-12-16 ENCOUNTER — Encounter: Payer: 59 | Admitting: Rehabilitative and Restorative Service Providers"

## 2019-12-21 NOTE — Progress Notes (Signed)
[facility] [address] [place]. [place] [address] [phone]       HOSPITAL FOLLOW UP NOTE  [name] Date of Birth:  [birth year] Medical Record Number:  [id number]   Reason for Referral:  hospital stroke follow up    SUBJECTIVE:   CHIEF COMPLAINT:  Chief Complaint  Patient presents with  . Follow-up    hospital fu, rm 9, with daughter    HPI:   Erin Sloan is a 59 y.o. female with history of COPD, tobacco use and HA  who presented on 11/18/2019 to [facility] with R sided numbness and weakness.  Transferred to [facility] for further evaluation by stroke team and Dr. 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  CTA head/neck mild arthrosclerosis.  Recommended DAPT for 3 weeks and aspirin alone.  No history of HTN or DM with A1c 5.5.  LDL 89 initiate atorvastatin.  Other stroke risk factors include EtOH use and longstanding tobacco use with cessation counseling provided.  Residual deficits of mild right-sided numbness and discharged home without therapy needs.  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  CTA head & neck no LVO. Mild atherosclerosis. Aortic atherosclerosis. Emphysema. Mild thyroid goiter.   MRI  L thalamic infarct. Small vessel disease. Atrophy.   2D Echo EF 55-60%. No source of embolus   LDL 89  HgbA1c 5.5  Lovenox 40 mg sq daily for VTE prophylaxis  No antithrombotic prior to admission, now on aspirin 81 mg daily and clopidogrel 75 mg daily. Continue DAPT x 3 weeks then aspirin alone.    Therapy recommendations:  No PT  Disposition:  Return home   Today, 12/22/2019, Erin Sloan is being seen for hospital follow-up accompanied by her daughter. Residual stroke deficits of worsening right-sided numbness compared to baseline and occasional right hand and  hip flexor weakness such as opening jars or going up steps.  She was evaluated by therapy but due to financial reasons, she has not returned and continues to do exercises at home.  She is currently on FMLA until 01/11/2020 assisted by PCP.  Completed 3 weeks DAPT and continues on aspirin alone as well as atorvastatin without side effects.  Blood pressure today 146/82.  Continues to follow with PCP for monitoring management of HTN with recent medication adjustments.  Tobacco use ongoing prior over pack per day and now smoking half pack per day.   She does report chronic history of bilateral hand and bilateral feet numbness/tingling and color change to a blue/white with colder temperatures as well as her lips.  Coloration will return to normal once warm.  Per patient, she did speak of this to her PCP who advised her to discuss further with our office for evaluation of neuropathy.  She does have history of bilateral carpal tunnel decompression 2012.  She does report mild left lower back pain but otherwise denies cervical or lumbar spinal issues.  She has not previously been evaluated for above concerns.     ROS:   14 system review of systems performed and negative with exception of see HPI  PMH:  Past Medical History:  Diagnosis Date  . COPD (chronic obstructive pulmonary disease) ([place])   . Depression   . Head ache     PSH:  Past Surgical History:  Procedure Laterality Date  .  CARPAL TUNNEL RELEASE    . CESAREAN SECTION     1985  . CESAREAN SECTION     1987    Social History:  Social History   Socioeconomic History  . Marital status: Widowed    Spouse name: Not on file  . Number of children: Not on file  . Years of education: Not on file  . Highest education level: Not on file  Occupational History  . Not on file  Tobacco Use  . Smoking status: Current Every Day Smoker    Packs/day: 1.50    Years: 48.00    Pack years: 72.00    Types: Cigarettes  . Smokeless tobacco: Never Used   Substance and Sexual Activity  . Alcohol use: Yes    Comment: socially. moderate now.  years ago drank heavier on weekends.  . Drug use: No  . Sexual activity: Not Currently  Other Topics Concern  . Not on file  Social History Narrative  . Not on file   Social Determinants of Health   Financial Resource Strain:   . Difficulty of Paying Living Expenses:   Food Insecurity:   . Worried About Programme researcher, broadcasting/film/video in the Last Year:   . Barista in the Last Year:   Transportation Needs:   . Freight forwarder (Medical):   Marland Kitchen Lack of Transportation (Non-Medical):   Physical Activity:   . Days of Exercise per Week:   . Minutes of Exercise per Session:   Stress:   . Feeling of Stress :   Social Connections:   . Frequency of Communication with Friends and Family:   . Frequency of Social Gatherings with Friends and Family:   . Attends Religious Services:   . Active Member of Clubs or Organizations:   . Attends Banker Meetings:   Marland Kitchen Marital Status:   Intimate Partner Violence:   . Fear of Current or Ex-Partner:   . Emotionally Abused:   Marland Kitchen Physically Abused:   . Sexually Abused:     Family History:  Family History  Problem Relation Age of Onset  . Pancreatic cancer Mother   . Heart disease Father   . Heart attack Father     Medications:   Current Outpatient Medications on File Prior to Visit  Medication Sig Dispense Refill  . albuterol (VENTOLIN HFA) 108 (90 Base) MCG/ACT inhaler Inhale 2 puffs into the lungs every 6 (six) hours as needed for wheezing or shortness of breath. 6.7 g 3  . aspirin 81 MG EC tablet Take 1 tablet (81 mg total) by mouth daily. 30 tablet 0  . atorvastatin (LIPITOR) 80 MG tablet Take 1 tablet (80 mg total) by mouth daily at 6 PM. 30 tablet 0  . MELATONIN PO Take 1 tablet by mouth at bedtime.    . nicotine (NICODERM CQ - DOSED IN MG/24 HOURS) 21 mg/24hr patch PLACE 1 PATCH (21 MG TOTAL) ONTO THE SKIN DAILY. 28 patch 0  .  umeclidinium-vilanterol (ANORO ELLIPTA) 62.5-25 MCG/INH AEPB TAKE 1 PUFF BY MOUTH EVERY DAY (Patient taking differently: Inhale 1 puff into the lungs daily. ) 60 each 3   No current facility-administered medications on file prior to visit.    Allergies:   Allergies  Allergen Reactions  . Ibuprofen Swelling  . Amoxicillin Hives    Has patient had a PCN reaction causing immediate rash, facial/tongue/throat swelling, SOB or lightheadedness with hypotension: No Has patient had a PCN reaction causing severe rash involving  mucus membranes or skin necrosis: No Has patient had a PCN reaction that required hospitalization: No Has patient had a PCN reaction occurring within the last 10 years: No If all of the above answers are "NO", then may proceed with Cephalosporin use.\      OBJECTIVE:  Physical Exam  Vitals:   12/22/19 0857  BP: (!) 146/82  Pulse: 88  Temp: (!) 97.2 F (36.2 C)  Weight: 117 lb (53.1 kg)  Height: 5\' 4"  (1.626 m)   Body mass index is 20.08 kg/m. No exam data present  No flowsheet data found.   General: Frail pleasant middle-age Caucasian female, seated, in no evident distress Head: head normocephalic and atraumatic.   Neck: supple with no carotid or supraclavicular bruits Cardiovascular: regular rate and rhythm, no murmurs Musculoskeletal: no deformity Skin:  no rash/petichiae Vascular:  Normal pulses all extremities   Neurologic Exam Mental Status: Awake and fully alert.  Normal speech and language. Oriented to place and time. Recent and remote memory intact. Attention span, concentration and fund of knowledge appropriate. Mood and affect appropriate.  Cranial Nerves: Fundoscopic exam reveals sharp disc margins. Pupils equal, briskly reactive to light. Extraocular movements full without nystagmus. Visual fields full to confrontation. Hearing intact. Facial sensation intact. Face, tongue, palate moves normally and symmetrically.  Motor: Normal bulk and tone.  Normal strength in all tested extremity muscles except mild left hip flexor weakness with patient reporting lower back pain. Sensory.:  Slight decreased pinprick sensation distal right fingertips and distal right toes.  Intact pinprick left upper and lower extremity.  Intact throughout all extremities to light touch Coordination: Rapid alternating movements normal in all extremities. Finger-to-nose and heel-to-shin performed accurately bilaterally. Gait and Station: Arises from chair without difficulty. Stance is normal. Gait demonstrates normal stride length and balance Reflexes: 1+ and symmetric. Toes downgoing.     NIHSS  1 Modified Rankin  2     ASSESSMENT:   Erin Sloan is a 59 y.o. 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 Vascular risk factors include HLD, tobacco use and headache history.  Residual deficits of subjective right-sided weakness and increased right-sided numbness/tingling compared to baseline.  Chronic history of distal bilateral upper and lower extremity numbness/tingling worsened with cold temperatures with reported skin color changes to blue/white as well as involvement of lips.     PLAN:  1. Left thalamic stroke:  -Residual deficits: Unable to appreciate weakness on today's exam and question whether difficulty opening jars and difficulty turning knobs may be due to decreased sensation.  Discussion regarding continued HEP as recommended during therapy sessions for ongoing improvement.  Ongoing FMLA provided by PCP -Continue aspirin 81 mg daily  and atorvastatin for secondary stroke prevention.  -Maintain strict control of hypertension with blood pressure goal below 130/90, diabetes with hemoglobin A1c goal below 6.5% and cholesterol with LDL cholesterol (bad cholesterol) goal below 70 mg/dL.  I also advised the patient to eat a healthy  diet with plenty of whole grains, cereals, fruits and vegetables, exercise regularly with at least 30 minutes of continuous activity daily and maintain ideal body weight. 2. HLD: Advised to continue current treatment regimen along with continued follow-up with PCP for future prescribing and monitoring of lipid panel 3. Tobacco use: Discussion regarding importance of cessation and to follow-up with PCP if interested in additional assistance for complete cessation of 4. Numbness/tingling: Concern for Raynaud's  syndrome as patient reports bilateral hands, toes and lips sensitivity to cold, color change with exposure to cold temperature and reported color change to blue/white.  Highly encouraged further evaluation by PCP and may perform EMG/NCV to rule out peripheral neuropathy -intact sensation left side with decreased sensation right side and question post stroke deficit.  Patient prefers to hold off at this time on additional testing and will discuss further with PCP.      Follow up in 3 months or call earlier if needed   I spent 50 minutes of face-to-face and non-face-to-face time with patient.  This included previsit chart review, lab review, study review, order entry, electronic health record documentation, patient education regarding recent stroke, residual deficits, importance of managing stroke risk factors and tobacco cessation, discussion regarding Raynaud's syndrome versus neuropathy and answered all questions to patient satisfaction     Ihor Austin, AGNP-BC  [facility] [address] [place], [place] [address]  Phone [phone] Fax [phone] Note: This document was prepared with digital dictation and possible smart phrase technology. Any transcriptional errors that result from this process are unintentional.

## 2019-12-22 ENCOUNTER — Other Ambulatory Visit: Payer: Self-pay

## 2019-12-22 ENCOUNTER — Encounter: Payer: Self-pay | Admitting: Adult Health

## 2019-12-22 ENCOUNTER — Ambulatory Visit: Payer: 59 | Admitting: Adult Health

## 2019-12-22 VITALS — BP 146/82 | HR 88 | Temp 97.2°F | Ht 64.0 in | Wt 117.0 lb

## 2019-12-22 DIAGNOSIS — E785 Hyperlipidemia, unspecified: Secondary | ICD-10-CM | POA: Diagnosis not present

## 2019-12-22 DIAGNOSIS — Z720 Tobacco use: Secondary | ICD-10-CM

## 2019-12-22 DIAGNOSIS — I7300 Raynaud's syndrome without gangrene: Secondary | ICD-10-CM

## 2019-12-22 DIAGNOSIS — I639 Cerebral infarction, unspecified: Secondary | ICD-10-CM | POA: Diagnosis not present

## 2019-12-22 DIAGNOSIS — I6381 Other cerebral infarction due to occlusion or stenosis of small artery: Secondary | ICD-10-CM

## 2019-12-22 NOTE — Patient Instructions (Signed)
Continue aspirin 81 mg daily  and atorvastatin for secondary stroke prevention  Continue to follow up with PCP regarding cholesterol and blood pressure management   Consider undergoing EMG/NCV to rule out peripheral neuropathy - also speak to your PCP regarding further evaluation for possible Raynaud's syndrome   Continue to monitor blood pressure at home  Maintain strict control of hypertension with blood pressure goal below 130/90, diabetes with hemoglobin A1c goal below 6.5% and cholesterol with LDL cholesterol (bad cholesterol) goal below 70 mg/dL. I also advised the patient to eat a healthy diet with plenty of whole grains, cereals, fruits and vegetables, exercise regularly and maintain ideal body weight.  Followup in the future with me in 3 months or call earlier if needed       Thank you for coming to see Erin Sloan at [facility]. I hope we have been able to provide you high quality care today.  You may receive a patient satisfaction survey over the next few weeks. We would appreciate your feedback and comments so that we may continue to improve ourselves and the health of our patients.    Peripheral Neuropathy Peripheral neuropathy is a type of nerve damage. It affects nerves that carry signals between the spinal cord and the arms, legs, and the rest of the body (peripheral nerves). It does not affect nerves in the spinal cord or brain. In peripheral neuropathy, one nerve or a group of nerves may be damaged. Peripheral neuropathy is a broad category that includes many specific nerve disorders, like diabetic neuropathy, hereditary neuropathy, and carpal tunnel syndrome. What are the causes? This condition may be caused by:  Diabetes. This is the most common cause of peripheral neuropathy.  Nerve injury.  Pressure or stress on a nerve that lasts a long time.  Lack (deficiency) of B vitamins. This can result from alcoholism, poor diet, or a restricted  diet.  Infections.  Autoimmune diseases, such as rheumatoid arthritis and systemic lupus erythematosus.  Nerve diseases that are passed from parent to child (inherited).  Some medicines, such as cancer medicines (chemotherapy).  Poisonous (toxic) substances, such as lead and mercury.  Too little blood flowing to the legs.  Kidney disease.  Thyroid disease. In some cases, the cause of this condition is not known. What are the signs or symptoms? Symptoms of this condition depend on which of your nerves is damaged. Common symptoms include:  Loss of feeling (numbness) in the feet, hands, or both.  Tingling in the feet, hands, or both.  Burning pain.  Very sensitive skin.  Weakness.  Not being able to move a part of the body (paralysis).  Muscle twitching.  Clumsiness or poor coordination.  Loss of balance.  Not being able to control your bladder.  Feeling dizzy.  Sexual problems. How is this diagnosed? Diagnosing and finding the cause of peripheral neuropathy can be difficult. Your health care provider will take your medical history and do a physical exam. A neurological exam will also be done. This involves checking things that are affected by your brain, spinal cord, and nerves (nervous system). For example, your health care provider will check your reflexes, how you move, and what you can feel. You may have other tests, such as:  Blood tests.  Electromyogram (EMG) and nerve conduction tests. These tests check nerve function and how well the nerves are controlling the muscles.  Imaging tests, such as CT scans or MRI to rule out other causes of your symptoms.  Removing a  small piece of nerve to be examined in a lab (nerve biopsy). This is rare.  Removing and examining a small amount of the fluid that surrounds the brain and spinal cord (lumbar puncture). This is rare. How is this treated? Treatment for this condition may involve:  Treating the underlying cause  of the neuropathy, such as diabetes, kidney disease, or vitamin deficiencies.  Stopping medicines that can cause neuropathy, such as chemotherapy.  Medicine to relieve pain. Medicines may include: ? Prescription or over-the-counter pain medicine. ? Antiseizure medicine. ? Antidepressants. ? Pain-relieving patches that are applied to painful areas of skin.  Surgery to relieve pressure on a nerve or to destroy a nerve that is causing pain.  Physical therapy to help improve movement and balance.  Devices to help you move around (assistive devices). Follow these instructions at home: Medicines  Take over-the-counter and prescription medicines only as told by your health care provider. Do not take any other medicines without first asking your health care provider.  Do not drive or use heavy machinery while taking prescription pain medicine. Lifestyle   Do not use any products that contain nicotine or tobacco, such as cigarettes and e-cigarettes. Smoking keeps blood from reaching damaged nerves. If you need help quitting, ask your health care provider.  Avoid or limit alcohol. Too much alcohol can cause a vitamin B deficiency, and vitamin B is needed for healthy nerves.  Eat a healthy diet. This includes: ? Eating foods that are high in fiber, such as fresh fruits and vegetables, whole grains, and beans. ? Limiting foods that are high in fat and processed sugars, such as fried or sweet foods. General instructions   If you have diabetes, work closely with your health care provider to keep your blood sugar under control.  If you have numbness in your feet: ? Check every day for signs of injury or infection. Watch for redness, warmth, and swelling. ? Wear padded socks and comfortable shoes. These help protect your feet.  Develop a good support system. Living with peripheral neuropathy can be stressful. Consider talking with a mental health specialist or joining a support group.  Use  assistive devices and attend physical therapy as told by your health care provider. This may include using a walker or a cane.  Keep all follow-up visits as told by your health care provider. This is important. Contact a health care provider if:  You have new signs or symptoms of peripheral neuropathy.  You are struggling emotionally from dealing with peripheral neuropathy.  Your pain is not well-controlled. Get help right away if:  You have an injury or infection that is not healing normally.  You develop new weakness in an arm or leg.  You fall frequently. Summary  Peripheral neuropathy is when the nerves in the arms, or legs are damaged, resulting in numbness, weakness, or pain.  There are many causes of peripheral neuropathy, including diabetes, pinched nerves, vitamin deficiencies, autoimmune disease, and hereditary conditions.  Diagnosing and finding the cause of peripheral neuropathy can be difficult. Your health care provider will take your medical history, do a physical exam, and do tests, including blood tests and nerve function tests.  Treatment involves treating the underlying cause of the neuropathy and taking medicines to help control pain. Physical therapy and assistive devices may also help. This information is not intended to replace advice given to you by your health care provider. Make sure you discuss any questions you have with your health care provider. Document  Revised: 08/09/2017 Document Reviewed: 11/05/2016 Elsevier Patient Education  [place].

## 2019-12-23 NOTE — Progress Notes (Signed)
I agree with the above plan 

## 2019-12-31 ENCOUNTER — Telehealth: Payer: Self-pay | Admitting: Medical

## 2019-12-31 NOTE — Telephone Encounter (Signed)
Caller : Erin Sloan Call Back # [phone]  Patient would like a return to letter written and mailed to her address.

## 2020-01-04 ENCOUNTER — Other Ambulatory Visit: Payer: Self-pay

## 2020-01-04 ENCOUNTER — Telehealth: Payer: Self-pay

## 2020-01-04 NOTE — Telephone Encounter (Signed)
Patient called in to see if PA [name] could mail a return back to work letter to her address.  Please advise the patient when this is done at [phone]

## 2020-01-04 NOTE — Telephone Encounter (Signed)
I would ask pt to schedule appointment. She has been off of work post stroke. So I need to document she has recovered and then clear her. If she has way to check blood pressure and pulse the visit can be virtual.

## 2020-01-05 ENCOUNTER — Ambulatory Visit: Payer: 59 | Admitting: Medical

## 2020-01-05 ENCOUNTER — Encounter: Payer: Self-pay | Admitting: Medical

## 2020-01-05 VITALS — BP 144/84 | HR 80 | Temp 98.2°F | Resp 16 | Ht 64.0 in | Wt 118.8 lb

## 2020-01-05 DIAGNOSIS — F17200 Nicotine dependence, unspecified, uncomplicated: Secondary | ICD-10-CM | POA: Diagnosis not present

## 2020-01-05 DIAGNOSIS — I10 Essential (primary) hypertension: Secondary | ICD-10-CM | POA: Diagnosis not present

## 2020-01-05 DIAGNOSIS — I6930 Unspecified sequelae of cerebral infarction: Secondary | ICD-10-CM | POA: Diagnosis not present

## 2020-01-05 MED ORDER — ATORVASTATIN CALCIUM 80 MG PO TABS: 80.0000 mg | ORAL_TABLET | Freq: Every day | ORAL | 3 refills | Status: DC

## 2020-01-05 MED ORDER — AMLODIPINE BESYLATE 2.5 MG PO TABS: 2.5000 mg | ORAL_TABLET | Freq: Every day | ORAL | 3 refills | Status: DC

## 2020-01-05 NOTE — Patient Instructions (Addendum)
For hx of stroke continue lipitor and aspirin.  For htn, will rx amlodipine 2.5 mg tab daily.  Please stop smoking as this is risk factor for recurrent stroke.  Your appear to have recovered post stroke. No appreciable rt side weakness with good coordination. You want to return to work and will put you back.  Follow up 1 month or as needed

## 2020-01-05 NOTE — Progress Notes (Signed)
Subjective:    Patient ID: Erin Sloan, female    DOB: 10-21-1960, 59 y.o.   MRN: [id number]  HPI  Pt in for follow up.  She states she wants to go back to work post stroke.  Pt has been out since November 11, 2019.  Pt has gone to PT and now doing exercises at home.  Pt felt weakness on rt side post stroke. She feels like she is almost pack to normal strength. Grip strength rt side little weak.   Pt does not check her bp at home.   Pt does smoke but has decreased from 1.5 pack a day to now 3 cigarettes a day.       Review of Systems  Constitutional: Negative for chills, fatigue and fever.  Respiratory: Negative for cough, chest tightness, shortness of breath and wheezing.   Cardiovascular: Negative for chest pain and palpitations.  Gastrointestinal: Negative for abdominal pain.  Musculoskeletal: Negative for back pain.  Neurological: Negative for dizziness, numbness and headaches.  Hematological: Negative for adenopathy. Does not bruise/bleed easily.  Psychiatric/Behavioral: Negative for behavioral problems and confusion.    Past Medical History:  Diagnosis Date  . COPD (chronic obstructive pulmonary disease) ([place])   . Depression   . Head ache      Social History   Socioeconomic History  . Marital status: Widowed    Spouse name: Not on file  . Number of children: Not on file  . Years of education: Not on file  . Highest education level: Not on file  Occupational History  . Not on file  Tobacco Use  . Smoking status: Current Every Day Smoker    Packs/day: 1.50    Years: 48.00    Pack years: 72.00    Types: Cigarettes  . Smokeless tobacco: Never Used  Substance and Sexual Activity  . Alcohol use: Yes    Comment: socially. moderate now.  years ago drank heavier on weekends.  . Drug use: No  . Sexual activity: Not Currently  Other Topics Concern  . Not on file  Social History Narrative  . Not on file   Social Determinants of Health   Financial  Resource Strain:   . Difficulty of Paying Living Expenses:   Food Insecurity:   . Worried About Charity fundraiser in the Last Year:   . Arboriculturist in the Last Year:   Transportation Needs:   . Film/video editor (Medical):   Marland Kitchen Lack of Transportation (Non-Medical):   Physical Activity:   . Days of Exercise per Week:   . Minutes of Exercise per Session:   Stress:   . Feeling of Stress :   Social Connections:   . Frequency of Communication with Friends and Family:   . Frequency of Social Gatherings with Friends and Family:   . Attends Religious Services:   . Active Member of Clubs or Organizations:   . Attends Archivist Meetings:   Marland Kitchen Marital Status:   Intimate Partner Violence:   . Fear of Current or Ex-Partner:   . Emotionally Abused:   Marland Kitchen Physically Abused:   . Sexually Abused:     Past Surgical History:  Procedure Laterality Date  . CARPAL TUNNEL RELEASE    . CESAREAN SECTION     1985  . CESAREAN SECTION     1987    Family History  Problem Relation Age of Onset  . Pancreatic cancer Mother   . Heart disease Father   .  Heart attack Father     Allergies  Allergen Reactions  . Ibuprofen Swelling  . Amoxicillin Hives    Has patient had a PCN reaction causing immediate rash, facial/tongue/throat swelling, SOB or lightheadedness with hypotension: No Has patient had a PCN reaction causing severe rash involving mucus membranes or skin necrosis: No Has patient had a PCN reaction that required hospitalization: No Has patient had a PCN reaction occurring within the last 10 years: No If all of the above answers are "NO", then may proceed with Cephalosporin use.\    Current Outpatient Medications on File Prior to Visit  Medication Sig Dispense Refill  . albuterol (VENTOLIN HFA) 108 (90 Base) MCG/ACT inhaler Inhale 2 puffs into the lungs every 6 (six) hours as needed for wheezing or shortness of breath. 6.7 g 3  . aspirin 81 MG EC tablet Take 1 tablet  (81 mg total) by mouth daily. 30 tablet 0  . atorvastatin (LIPITOR) 80 MG tablet Take 1 tablet (80 mg total) by mouth daily at 6 PM. 30 tablet 0  . MELATONIN PO Take 1 tablet by mouth at bedtime.    . nicotine (NICODERM CQ - DOSED IN MG/24 HOURS) 21 mg/24hr patch PLACE 1 PATCH (21 MG TOTAL) ONTO THE SKIN DAILY. 28 patch 0  . umeclidinium-vilanterol (ANORO ELLIPTA) 62.5-25 MCG/INH AEPB TAKE 1 PUFF BY MOUTH EVERY DAY (Patient taking differently: Inhale 1 puff into the lungs daily. ) 60 each 3   No current facility-administered medications on file prior to visit.    BP (!) 162/90 (BP Location: Left Arm, Patient Position: Sitting, Cuff Size: Normal)   Pulse 80   Temp 98.2 F (36.8 C) (Temporal)   Resp 16   Ht 5\' 4"  (1.626 m)   Wt 118 lb 12.8 oz (53.9 kg)   SpO2 99%   BMI 20.39 kg/m       Objective:   Physical Exam  General Mental Status- Alert. General Appearance- Not in acute distress.   Skin General: Color- Normal Color. Moisture- Normal Moisture.  Neck Carotid Arteries- Normal color. Moisture- Normal Moisture. No JVD.  Chest and Lung Exam Auscultation: Breath Sounds:-Normal.  Cardiovascular Auscultation:Rythm- Regular. Murmurs & Other Heart Sounds:Auscultation of the heart reveals- No Murmurs.  . Neurologic Cranial Nerve exam:- CN III-XII intact(No nystagmus), symmetric smile. Drift Test:- No drift. Finger to Nose:- Normal/Intact Strength:- 5/5 equal and symmetric strength both upper and lower extremities.      Assessment & Plan:  For hx of stroke continue lipitor and aspirin.  For htn, will rx amlodipine 2.5 mg tab daily.  Please stop smoking as this is risk factor for recurrent stroke.  Your appear to have recovered post stroke. No appreciable rt side weakness with good coordination. You want to return to work and will put you back.  Follow up 1 month or as needed  Marland Kitchen, PA-C   Time spent with patient today was 20  minutes which consisted  of chart review, discussing diagnosis, work up treatment and documentation.

## 2020-01-09 ENCOUNTER — Other Ambulatory Visit: Payer: Self-pay | Admitting: Medical

## 2020-02-09 ENCOUNTER — Ambulatory Visit: Payer: 59 | Admitting: Medical

## 2020-02-09 ENCOUNTER — Other Ambulatory Visit: Payer: Self-pay | Admitting: Medical

## 2020-02-10 ENCOUNTER — Other Ambulatory Visit: Payer: Self-pay | Admitting: Adult Health

## 2020-02-12 ENCOUNTER — Other Ambulatory Visit: Payer: Self-pay | Admitting: Medical

## 2020-02-14 ENCOUNTER — Other Ambulatory Visit: Payer: Self-pay | Admitting: Medical

## 2020-02-17 ENCOUNTER — Telehealth: Payer: Self-pay | Admitting: Medical

## 2020-02-17 NOTE — Telephone Encounter (Signed)
Patient requested refill for omeprazole which was discontinued at hospital discharge on 11/19/2019, therefore not refilled today.  Patient has continued omeprazole daily since discharge without issue.  Patient did stop taking her amlodipine 2.5 mg (only took 2 days) d/t jitteriness. States her BP at [facility] is around 130/80.   Patient due for F/U with PCP, scheduled for 02/23/2020.

## 2020-02-17 NOTE — Telephone Encounter (Signed)
Caller : Selda  Call Back # [phone]  Patient calling in regards to medication refill on : omeprazole (PRILOSEC) 40 MG capsule [[id number]] DISCONTINUED

## 2020-02-23 ENCOUNTER — Other Ambulatory Visit: Payer: Self-pay

## 2020-02-23 ENCOUNTER — Ambulatory Visit: Payer: 59 | Admitting: Medical

## 2020-02-23 ENCOUNTER — Encounter: Payer: Self-pay | Admitting: Medical

## 2020-02-23 VITALS — BP 156/90 | HR 78 | Resp 14 | Ht 64.0 in | Wt 118.0 lb

## 2020-02-23 DIAGNOSIS — I10 Essential (primary) hypertension: Secondary | ICD-10-CM

## 2020-02-23 DIAGNOSIS — K219 Gastro-esophageal reflux disease without esophagitis: Secondary | ICD-10-CM

## 2020-02-23 DIAGNOSIS — R5383 Other fatigue: Secondary | ICD-10-CM | POA: Diagnosis not present

## 2020-02-23 MED ORDER — OMEPRAZOLE 40 MG PO CPDR
40.0000 mg | DELAYED_RELEASE_CAPSULE | Freq: Every day | ORAL | 11 refills | Status: DC
Start: 2020-02-23 — End: 2021-02-10

## 2020-02-23 MED ORDER — LOSARTAN POTASSIUM 50 MG PO TABS
50.0000 mg | ORAL_TABLET | Freq: Every day | ORAL | 11 refills | Status: DC
Start: 2020-02-23 — End: 2021-02-10

## 2020-02-23 MED ORDER — ALBUTEROL SULFATE HFA 108 (90 BASE) MCG/ACT IN AERS: 2.0000 | INHALATION_SPRAY | Freq: Four times a day (QID) | RESPIRATORY_TRACT | 3 refills | Status: DC | PRN

## 2020-02-23 NOTE — Patient Instructions (Addendum)
You bp is elevated. Will rx losartan and see if adequate dose. Important to control bp in light of your hx of stroke.  For gerd, I refilled omeprazole.   For fatigue, I am ordering cbc, cmp, tsh, b12, b1 and vit d  Follow up 2 weeks or as needed

## 2020-02-23 NOTE — Progress Notes (Signed)
Subjective:    Patient ID: Erin Sloan, female    DOB: 26-Sep-1960, 59 y.o.   MRN: [id number]  HPI  Pt in for follow up.  Pt states on 2nd day of starting amlodipine. She states on 2nd day she felt jittery and out of it. She stopped the medication on day 3 and felt back to normal.   No chest pain or gross motor sensory deficits.   Pt has reflux that was controlled with omeprazole. She ran out of meds and started to feel symptoms reoccuring.  Other than amlodipine has not been on any other medications for bp. [facility]83.  Pt been fatigued over past 3 months.  Pt request refill of albuterol. She uses it once at night.    Review of Systems  Constitutional: Positive for fatigue. Negative for chills and unexpected weight change.  Respiratory: Negative for chest tightness, shortness of breath and wheezing.   Cardiovascular: Negative for chest pain and palpitations.  Gastrointestinal: Negative for abdominal pain and blood in stool.  Musculoskeletal: Negative for back pain, joint swelling and neck stiffness.  Skin: Negative for rash.  Hematological: Negative for adenopathy. Does not bruise/bleed easily.  Psychiatric/Behavioral: Negative for behavioral problems and confusion.    Past Medical History:  Diagnosis Date  . COPD (chronic obstructive pulmonary disease) ([facility])   . Depression   . Head ache      Social History   Socioeconomic History  . Marital status: Widowed    Spouse name: Not on file  . Number of children: Not on file  . Years of education: Not on file  . Highest education level: Not on file  Occupational History  . Not on file  Tobacco Use  . Smoking status: Current Every Day Smoker    Packs/day: 1.50    Years: 48.00    Pack years: 72.00    Types: Cigarettes  . Smokeless tobacco: Never Used  Substance and Sexual Activity  . Alcohol use: Yes    Comment: socially. moderate now.  years ago drank heavier on weekends.  . Drug use: No    . Sexual activity: Not Currently  Other Topics Concern  . Not on file  Social History Narrative  . Not on file   Social Determinants of Health   Financial Resource Strain:   . Difficulty of Paying Living Expenses:   Food Insecurity:   . Worried About Programme researcher, broadcasting/film/video in the Last Year:   . Barista in the Last Year:   Transportation Needs:   . Freight forwarder (Medical):   Marland Kitchen Lack of Transportation (Non-Medical):   Physical Activity:   . Days of Exercise per Week:   . Minutes of Exercise per Session:   Stress:   . Feeling of Stress :   Social Connections:   . Frequency of Communication with Friends and Family:   . Frequency of Social Gatherings with Friends and Family:   . Attends Religious Services:   . Active Member of Clubs or Organizations:   . Attends Banker Meetings:   Marland Kitchen Marital Status:   Intimate Partner Violence:   . Fear of Current or Ex-Partner:   . Emotionally Abused:   Marland Kitchen Physically Abused:   . Sexually Abused:     Past Surgical History:  Procedure Laterality Date  . CARPAL TUNNEL RELEASE    . CESAREAN SECTION     1985  . CESAREAN SECTION  56    Family History  Problem Relation Age of Onset  . Pancreatic cancer Mother   . Heart disease Father   . Heart attack Father     Allergies  Allergen Reactions  . Ibuprofen Swelling  . Amoxicillin Hives    Has patient had a PCN reaction causing immediate rash, facial/tongue/throat swelling, SOB or lightheadedness with hypotension: No Has patient had a PCN reaction causing severe rash involving mucus membranes or skin necrosis: No Has patient had a PCN reaction that required hospitalization: No Has patient had a PCN reaction occurring within the last 10 years: No If all of the above answers are "NO", then may proceed with Cephalosporin use.\    Current Outpatient Medications on File Prior to Visit  Medication Sig Dispense Refill  . albuterol (VENTOLIN HFA) 108 (90 Base)  MCG/ACT inhaler Inhale 2 puffs into the lungs every 6 (six) hours as needed for wheezing or shortness of breath. 6.7 g 3  . amLODipine (NORVASC) 2.5 MG tablet Take 1 tablet (2.5 mg total) by mouth daily. (Patient not taking: Reported on 02/23/2020) 90 tablet 3  . ASPIRIN LOW DOSE 81 MG EC tablet TAKE 1 TABLET BY MOUTH EVERY DAY 30 tablet 0  . atorvastatin (LIPITOR) 80 MG tablet Take 1 tablet (80 mg total) by mouth daily at 6 PM. 90 tablet 3  . MELATONIN PO Take 1 tablet by mouth at bedtime.    . nicotine (NICODERM CQ - DOSED IN MG/24 HOURS) 21 mg/24hr patch PLACE 1 PATCH (21 MG TOTAL) ONTO THE SKIN DAILY. (Patient not taking: Reported on 02/23/2020) 28 patch 0  . umeclidinium-vilanterol (ANORO ELLIPTA) 62.5-25 MCG/INH AEPB INHALE 1 PUFF BY MOUTH EVERY DAY 60 each 3   No current facility-administered medications on file prior to visit.    BP (!) 168/90 (BP Location: Left Arm, Patient Position: Sitting, Cuff Size: Normal) Comment: 156/90  Pulse 78   Resp 14   Ht 5\' 4"  (1.626 m)   Wt 118 lb (53.5 kg)   SpO2 98%   BMI 20.25 kg/m       Objective:   Physical Exam  General Mental Status- Alert. General Appearance- Not in acute distress.   Skin General: Color- Normal Color. Moisture- Normal Moisture.  Neck Carotid Arteries- Normal color. Moisture- Normal Moisture. No carotid bruits. No JVD.  Chest and Lung Exam Auscultation: Breath Sounds:-Normal.  Cardiovascular Auscultation:Rythm- Regular. Murmurs & Other Heart Sounds:Auscultation of the heart reveals- No Murmurs.  Abdomen Inspection:-Inspeection Normal. Palpation/Percussion:Note:No mass. Palpation and Percussion of the abdomen reveal- Non Tender, Non Distended + BS, no rebound or guarding.    Neurologic Cranial Nerve exam:- CN III-XII intact(No nystagmus), symmetric smile. Strength:- 5/5 equal and symmetric strength both upper and lower extremities.      Assessment & Plan:  You bp is elevated. Will rx losartan and see  if adequate dose. Important to control bp in light of your hx of stroke.  For gerd, I refilled omeprazole.   For fatigue, I am ordering cbc, cmp, tsh, b12, b1 and vit d  Follow up 2 weeks or as needed  Time spent with patient today was 25  minutes which consisted of chart review, discussing diagnosis, work up treatment and documentation.

## 2020-02-24 ENCOUNTER — Telehealth: Payer: Self-pay | Admitting: Medical

## 2020-02-24 LAB — CBC WITH DIFFERENTIAL/PLATELET
Basophils Absolute: 0 10*3/uL (ref 0.0–0.1)
Basophils Relative: 1 % (ref 0.0–3.0)
Eosinophils Absolute: 0.2 10*3/uL (ref 0.0–0.7)
Eosinophils Relative: 3.5 % (ref 0.0–5.0)
HCT: 41.3 % (ref 36.0–46.0)
Hemoglobin: 14.2 g/dL (ref 12.0–15.0)
Lymphocytes Relative: 26.1 % (ref 12.0–46.0)
Lymphs Abs: 1.1 10*3/uL (ref 0.7–4.0)
MCHC: 34.3 g/dL (ref 30.0–36.0)
MCV: 103.8 fl — ABNORMAL HIGH (ref 78.0–100.0)
Monocytes Absolute: 0.7 10*3/uL (ref 0.1–1.0)
Monocytes Relative: 16.4 % — ABNORMAL HIGH (ref 3.0–12.0)
Neutro Abs: 2.3 10*3/uL (ref 1.4–7.7)
Neutrophils Relative %: 53 % (ref 43.0–77.0)
Platelets: 177 10*3/uL (ref 150.0–400.0)
RBC: 3.98 Mil/uL (ref 3.87–5.11)
RDW: 13.2 % (ref 11.5–15.5)
WBC: 4.4 10*3/uL (ref 4.0–10.5)

## 2020-02-24 LAB — COMPREHENSIVE METABOLIC PANEL
ALT: 15 U/L (ref 0–35)
AST: 18 U/L (ref 0–37)
Albumin: 3.9 g/dL (ref 3.5–5.2)
Alkaline Phosphatase: 83 U/L (ref 39–117)
BUN: 12 mg/dL (ref 6–23)
CO2: 29 mEq/L (ref 19–32)
Calcium: 8.9 mg/dL (ref 8.4–10.5)
Chloride: 101 mEq/L (ref 96–112)
Creatinine, Ser: 0.59 mg/dL (ref 0.40–1.20)
GFR: 103.68 mL/min (ref >60.00)
Glucose, Bld: 94 mg/dL (ref 70–99)
Potassium: 4 mEq/L (ref 3.5–5.1)
Sodium: 136 mEq/L (ref 135–145)
Total Bilirubin: 0.5 mg/dL (ref 0.2–1.2)
Total Protein: 6.5 g/dL (ref 6.0–8.3)

## 2020-02-24 LAB — TSH: TSH: 1.2 u[IU]/mL (ref 0.35–4.50)

## 2020-02-24 LAB — VITAMIN B12: Vitamin B-12: 305 pg/mL (ref 211–911)

## 2020-02-24 LAB — VITAMIN D 25 HYDROXY (VIT D DEFICIENCY, FRACTURES): VITD: 15.2 ng/mL — ABNORMAL LOW (ref 30.00–100.00)

## 2020-02-24 MED ORDER — VITAMIN D (ERGOCALCIFEROL) 1.25 MG (50000 UNIT) PO CAPS
50000.0000 [IU] | ORAL_CAPSULE | ORAL | 0 refills | Status: DC
Start: 2020-02-24 — End: 2020-03-28

## 2020-02-24 NOTE — Telephone Encounter (Signed)
Rx vit D sent to pt pharmacy. 

## 2020-02-26 LAB — VITAMIN B1: Vitamin B1 (Thiamine): 10 nmol/L (ref 8–30)

## 2020-03-13 ENCOUNTER — Other Ambulatory Visit: Payer: Self-pay | Admitting: Medical

## 2020-03-15 ENCOUNTER — Ambulatory Visit: Payer: 59

## 2020-03-15 ENCOUNTER — Other Ambulatory Visit: Payer: Self-pay

## 2020-03-15 VITALS — BP 126/74 | HR 82 | Resp 16

## 2020-03-15 DIAGNOSIS — I10 Essential (primary) hypertension: Secondary | ICD-10-CM

## 2020-03-15 NOTE — Progress Notes (Addendum)
BP Readings from Last 3 Encounters:  02/23/20 (!) 156/90  01/05/20 (!) 144/84  12/22/19 (!) 146/82   Patient here for blood pressure check. Patient is currently taking Losartan 50 mg.   Blood pressure today is 126/74 With a pulse of 82.  Per Dr. Carmelia Roller patient was advised to continue the same medication and follow up with PCP in 3 months. Patient will call back to schedule when she has her work schedule.   Noted. Agree with above.  Jilda Roche Wright, DO 03/15/20 4:13 PM

## 2020-03-16 ENCOUNTER — Other Ambulatory Visit: Payer: Self-pay | Admitting: Medical

## 2020-03-16 NOTE — Telephone Encounter (Signed)
Would you like Pt to continue Ergocalciferol?  

## 2020-03-17 ENCOUNTER — Telehealth: Payer: Self-pay | Admitting: Medical

## 2020-03-17 NOTE — Telephone Encounter (Signed)
Pt 02-24-20 rx of vit d was for 8 tabs. One capsule weekly. So not sure why she is asking for refill?   She should have about 5 tabs/5 weeks left.  If they only gave her 4 tabs then can send in additional 4 tabs.  Thanks,

## 2020-03-28 ENCOUNTER — Other Ambulatory Visit: Payer: Self-pay

## 2020-03-28 ENCOUNTER — Ambulatory Visit: Payer: 59 | Admitting: Adult Health

## 2020-03-28 ENCOUNTER — Ambulatory Visit: Payer: 59 | Admitting: Pulmonary Disease

## 2020-03-28 ENCOUNTER — Encounter: Payer: Self-pay | Admitting: Pulmonary Disease

## 2020-03-28 DIAGNOSIS — Z720 Tobacco use: Secondary | ICD-10-CM

## 2020-03-28 DIAGNOSIS — J432 Centrilobular emphysema: Secondary | ICD-10-CM | POA: Diagnosis not present

## 2020-03-28 DIAGNOSIS — R918 Other nonspecific abnormal finding of lung field: Secondary | ICD-10-CM

## 2020-03-28 MED ORDER — ANORO ELLIPTA 62.5-25 MCG/INH IN AEPB: INHALATION_SPRAY | RESPIRATORY_TRACT | 3 refills | Status: DC

## 2020-03-28 MED ORDER — NICOTINE 10 MG IN INHA
1.0000 | RESPIRATORY_TRACT | 0 refills | Status: DC | PRN
Start: 2020-03-28 — End: 2021-11-29

## 2020-03-28 NOTE — Patient Instructions (Signed)
Refills on Anoro.  You have to make another quit attempt. Prescription for Nicotrol inhaler -8 to 10 puffs daily

## 2020-03-28 NOTE — Assessment & Plan Note (Signed)
Continue on Anoro, refills provided

## 2020-03-28 NOTE — Assessment & Plan Note (Signed)
Stable on last CT 09/2019. Annual follow-up low-dose CT can be arranged in January

## 2020-03-28 NOTE — Assessment & Plan Note (Signed)
She has tried nicotine patches in the past without benefit. Agreeable to use Nicotrol inhaler, will provide prescription

## 2020-03-28 NOTE — Addendum Note (Signed)
Addended by: Luna Kitchens D on: 03/28/2020 04:19 PM   Modules accepted: Orders

## 2020-03-28 NOTE — Progress Notes (Signed)
   Subjective:    Patient ID: Erin Sloan, female    DOB: Nov 16, 1960, 59 y.o.   MRN: [id number]  HPI  59 year old smoker for follow-up of emphysema and pulmonary nodules  She was hospitalized 11/2019 for right-sided numbness and found to have a 5 mm stroke on MRI. She continues to smoke half pack per day. Complains of tiredness and fatigue, dyspnea is at baseline, continues on Anoro She has tried nicotine patches to quit in the past  Significant tests/ events reviewed  Spirometry 09/2018 ratio 71, FEV1 78%, FVC 84%  CT chest 1/ 2021 showed emphysema.  1-2 mm right upper lobe nodule peripherally stable CT chest angiogram 08/2018 moderate emphysema,2 to 3 mm nodules  Review of Systems Patient denies significant dyspnea,cough, hemoptysis,  chest pain, palpitations, pedal edema, orthopnea, paroxysmal nocturnal dyspnea, lightheadedness, nausea, vomiting, abdominal or  leg pains      Objective:   Physical Exam  Gen. Pleasant, thin, in no distress ENT - no thrush, no pallor/icterus,no post nasal drip Neck: No JVD, no thyromegaly, no carotid bruits Lungs: no use of accessory muscles, no dullness to percussion, clear without rales or rhonchi  Cardiovascular: Rhythm regular, heart sounds  normal, no murmurs or gallops, no peripheral edema Musculoskeletal: No deformities, no cyanosis or clubbing        Assessment & Plan:

## 2020-05-31 IMAGING — CT CT HEAD W/O CM
3 series · 16 of 47 positions shown, 19 images · non-contrast
Comparison: None.

CLINICAL DATA: Focal neuro deficit, weakness on the right

EXAM:
CT HEAD WITHOUT CONTRAST
TECHNIQUE: Contiguous axial images were obtained from the base of the skull
through the vertex without intravenous contrast.

[Series 2: head wo · axial · 0.44mm/px · z∈[-142,-17]mm · 10 of 31 slices shown, 13 images]
[im 3/31  brain]
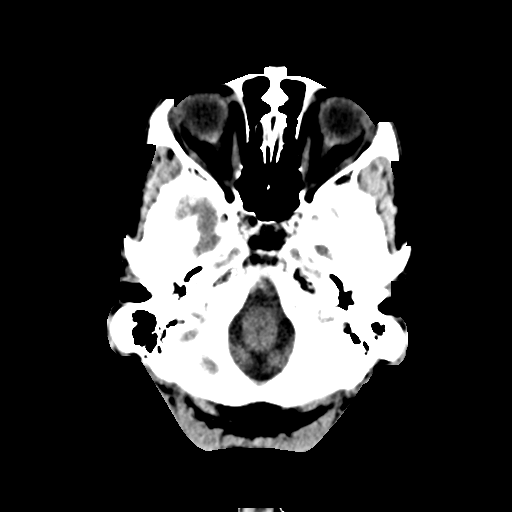
[im 3/31  bone]
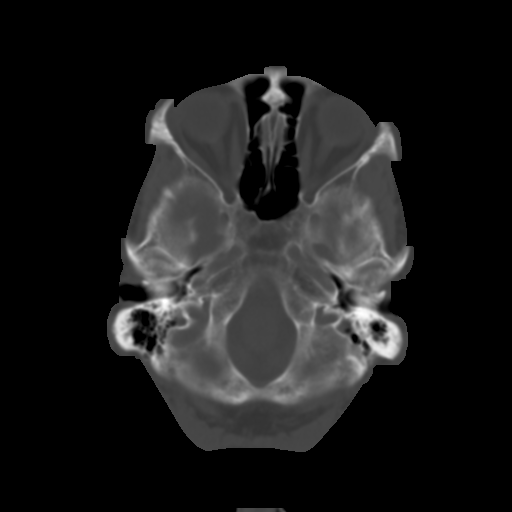
[im 6/31  brain]
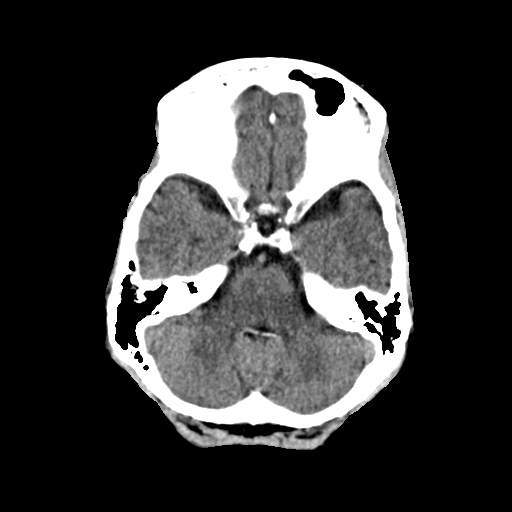
[im 9/31  brain]
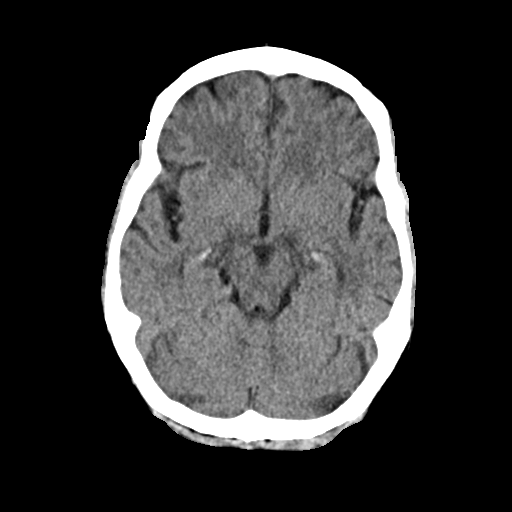
[im 11/31  brain]
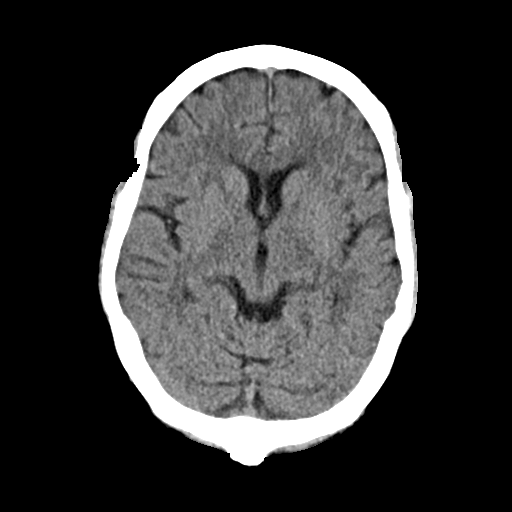
[im 14/31  brain]
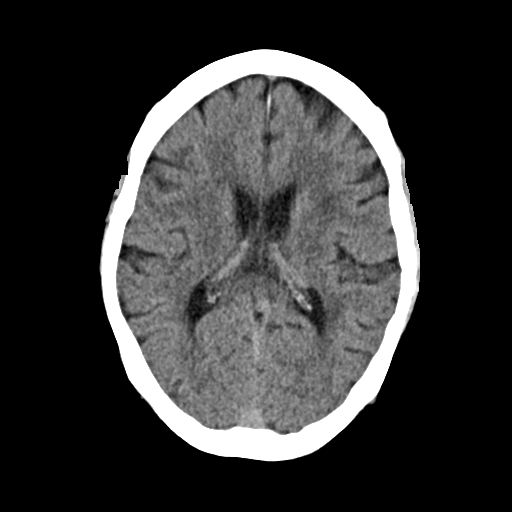
[im 14/31  bone]
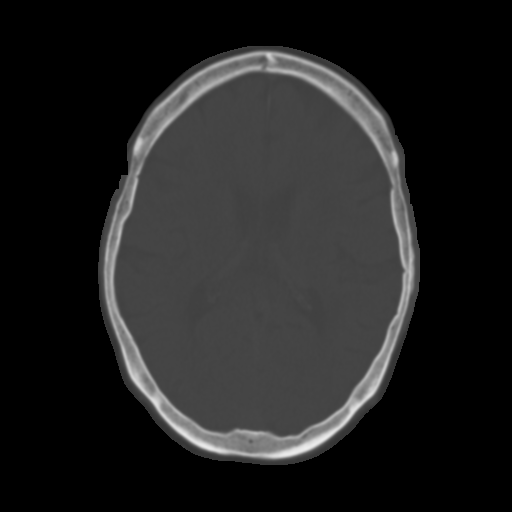
[im 17/31  brain]
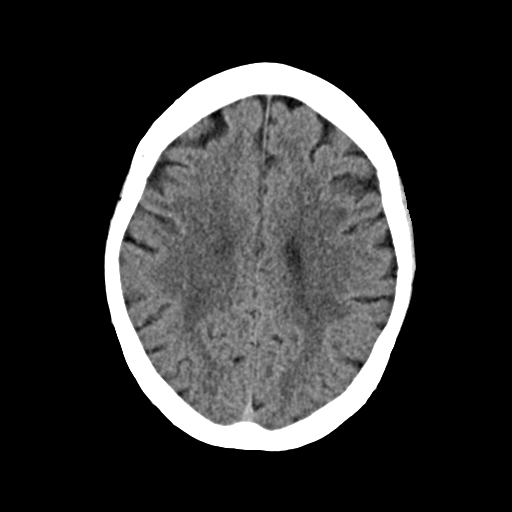
[im 20/31  brain]
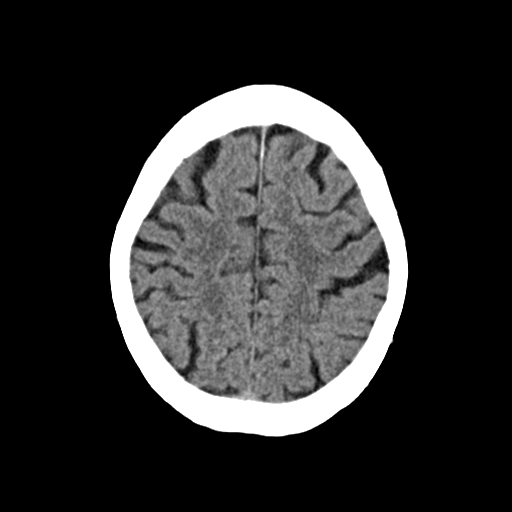
[im 23/31  brain]
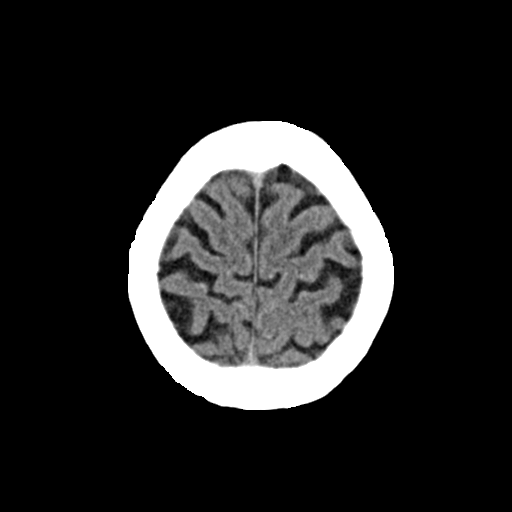
[im 25/31  brain]
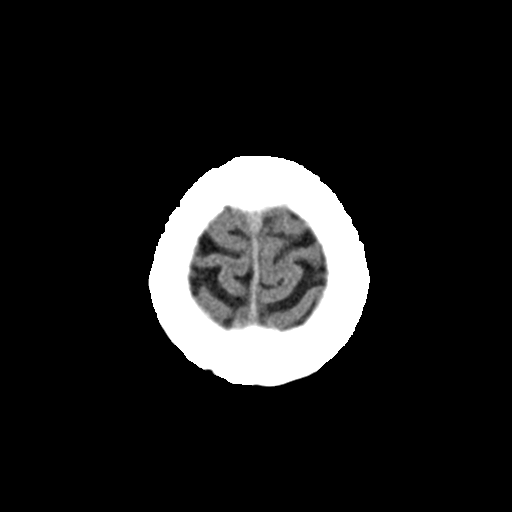
[im 25/31  bone]
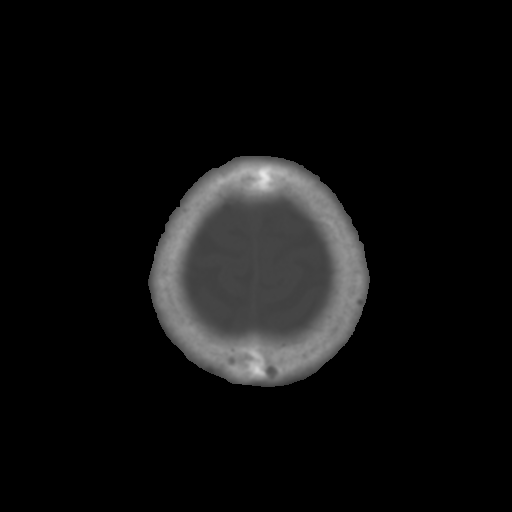
[im 28/31  brain]
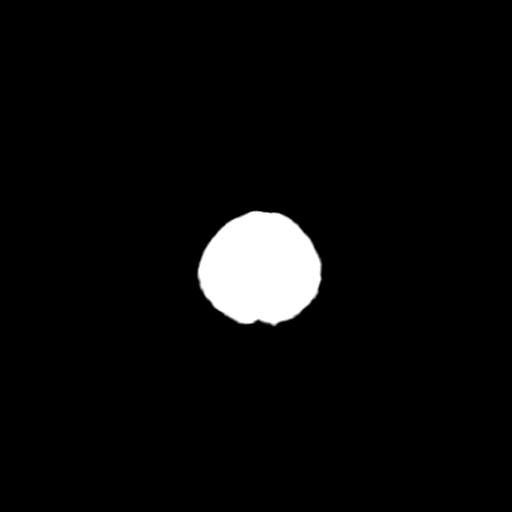

[Series 4: coronal soft · coronal · 0.30mm/px · 3 of 65 slices shown]
[im 22/65  brain]
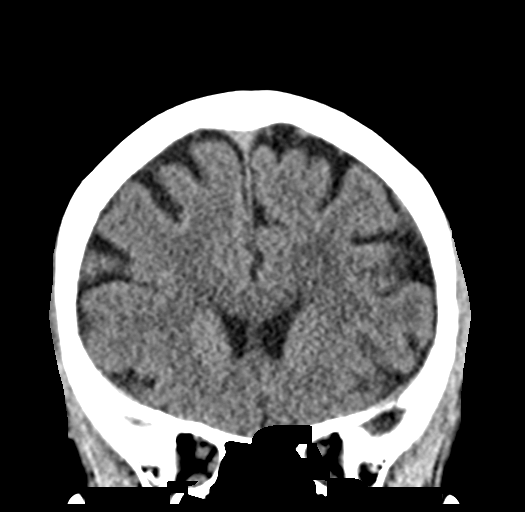
[im 29/65  brain]
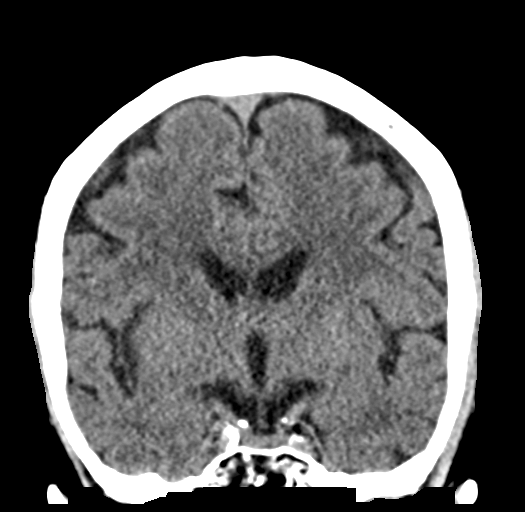
[im 36/65  brain]
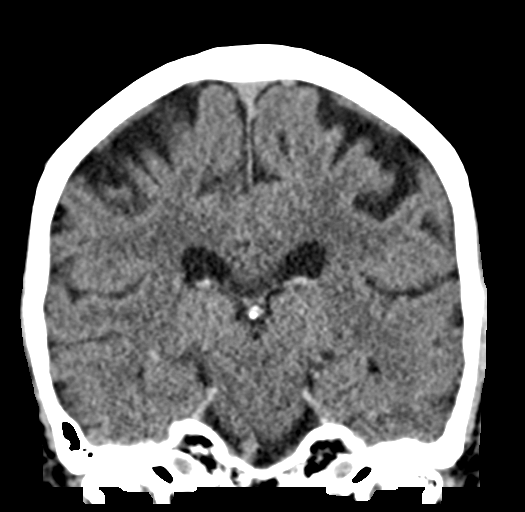

[Series 5: sag soft · sagittal · 0.31mm/px · 3 of 51 slices shown]
[im 17/51  brain]
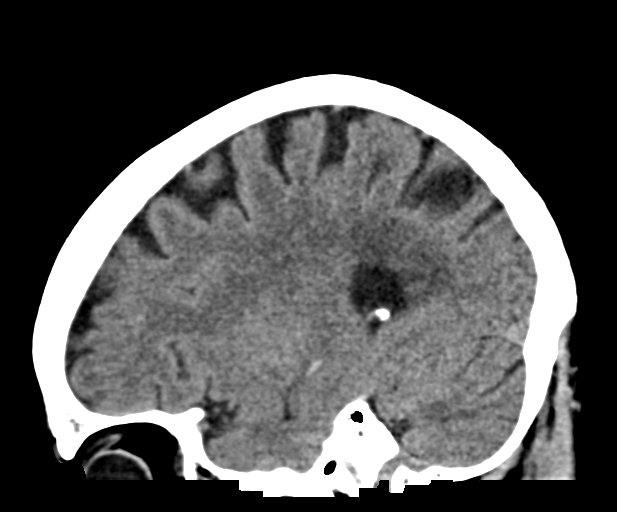
[im 26/51  brain]
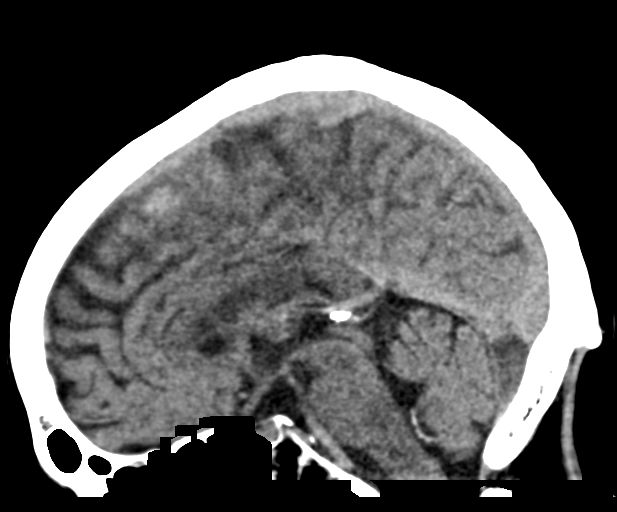
[im 34/51  brain]
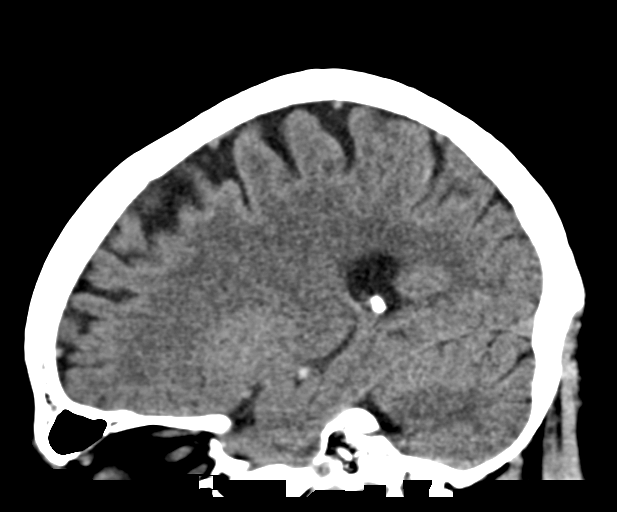

[16 of 47 positions shown; findings below may reference images not displayed]

FINDINGS: Brain: Subtle hypoattenuation in the anterior limb of left internal
capsule and left periventricular deep white matter in the left
frontal lobe. No signs of hemorrhage, hydrocephalus, midline shift
or mass effect.

Vascular: No hyperdense vessel or unexpected calcification.

Skull: Normal. Negative for fracture or focal lesion.

Sinuses/Orbits: Visualized paranasal sinuses are unremarkable. The
visualized portions of the orbits also normal.

Other: None.
IMPRESSION: Subtle hypoattenuation in the anterior limb of left internal capsule
and left periventricular deep white matter in the left frontal lobe.
Findings may represent chronic microvascular ischemic changes with
asymmetric left frontal involvement, can not exclude a recent white
matter infarct in this area. MRI may be helpful for further
assessment.

## 2020-06-24 ENCOUNTER — Encounter: Payer: Self-pay | Admitting: Medical

## 2020-06-24 ENCOUNTER — Ambulatory Visit: Payer: 59 | Admitting: Medical

## 2020-06-24 ENCOUNTER — Other Ambulatory Visit: Payer: Self-pay

## 2020-06-24 VITALS — BP 135/70 | HR 78 | Ht 64.0 in | Wt 117.6 lb

## 2020-06-24 DIAGNOSIS — J432 Centrilobular emphysema: Secondary | ICD-10-CM

## 2020-06-24 DIAGNOSIS — T148XXA Other injury of unspecified body region, initial encounter: Secondary | ICD-10-CM

## 2020-06-24 DIAGNOSIS — I10 Essential (primary) hypertension: Secondary | ICD-10-CM

## 2020-06-24 DIAGNOSIS — K219 Gastro-esophageal reflux disease without esophagitis: Secondary | ICD-10-CM

## 2020-06-24 DIAGNOSIS — I6930 Unspecified sequelae of cerebral infarction: Secondary | ICD-10-CM

## 2020-06-24 DIAGNOSIS — Z7185 Encounter for immunization safety counseling: Secondary | ICD-10-CM

## 2020-06-24 MED ORDER — ATORVASTATIN CALCIUM 80 MG PO TABS
80.0000 mg | ORAL_TABLET | Freq: Every day | ORAL | 3 refills | Status: DC
Start: 2020-06-24 — End: 2020-07-25

## 2020-06-24 NOTE — Patient Instructions (Addendum)
Your blood pressure is well controlled today.  Continue losartan.  CMP lab order today.  History of GERD and symptoms controlled with omeprazole.  Also encouraged healthy diet.  Recent easy bruising of upper extremities.  Will get CBC today.  History of CVA.  Recommend continuing aspirin.  Continue atorvastatin. Gave rx today.   For history of COPD continue current inhalers.  Counseled on benefits versus risk Covid vaccine.  Do think in your age group with Covid risk complication score that you should get vaccinated.  I would recommend moderna or [employer].  Follow-up in 3 months or as needed.

## 2020-06-24 NOTE — Progress Notes (Signed)
Subjective:    Patient ID: Erin Sloan, female    DOB: 08/12/1961, 59 y.o.   MRN: [id number]  HPI  Pt in for follow up.  Pt states she has easy bruising for about 2 months. Slight bumps will cause bruise. No nose bleeds. Pt is on low dose aspirin.  Pt has hx of gerd. Pt takes omeprazole daily and stomach feels good/symptoms controlled.  Pt has htn. bp controlled today. Better on repeat check. Pt does not check bp at home.  Hx of cva.    Review of Systems  Constitutional: Negative for chills, fatigue and fever.  Respiratory: Negative for cough, choking, chest tightness and wheezing.   Cardiovascular: Negative for chest pain and palpitations.  Gastrointestinal: Negative for abdominal pain.  Musculoskeletal: Negative for back pain.  Skin: Negative for rash.  Neurological: Negative for dizziness and headaches.  Hematological: Negative for adenopathy. Does not bruise/bleed easily.  Psychiatric/Behavioral: Negative for behavioral problems, decreased concentration and suicidal ideas. The patient is not nervous/anxious and is not hyperactive.     Past Medical History:  Diagnosis Date  . COPD (chronic obstructive pulmonary disease) (HCC)   . Depression   . Head ache      Social History   Socioeconomic History  . Marital status: Widowed    Spouse name: Not on file  . Number of children: Not on file  . Years of education: Not on file  . Highest education level: Not on file  Occupational History  . Not on file  Tobacco Use  . Smoking status: Current Every Day Smoker    Packs/day: 1.50    Years: 48.00    Pack years: 72.00    Types: Cigarettes  . Smokeless tobacco: Never Used  Substance and Sexual Activity  . Alcohol use: Yes    Comment: socially. moderate now.  years ago drank heavier on weekends.  . Drug use: No  . Sexual activity: Not Currently  Other Topics Concern  . Not on file  Social History Narrative  . Not on file   Social Determinants of Health    Financial Resource Strain:   . Difficulty of Paying Living Expenses: Not on file  Food Insecurity:   . Worried About Programme researcher, broadcasting/film/video in the Last Year: Not on file  . Ran Out of Food in the Last Year: Not on file  Transportation Needs:   . Lack of Transportation (Medical): Not on file  . Lack of Transportation (Non-Medical): Not on file  Physical Activity:   . Days of Exercise per Week: Not on file  . Minutes of Exercise per Session: Not on file  Stress:   . Feeling of Stress : Not on file  Social Connections:   . Frequency of Communication with Friends and Family: Not on file  . Frequency of Social Gatherings with Friends and Family: Not on file  . Attends Religious Services: Not on file  . Active Member of Clubs or Organizations: Not on file  . Attends Banker Meetings: Not on file  . Marital Status: Not on file  Intimate Partner Violence:   . Fear of Current or Ex-Partner: Not on file  . Emotionally Abused: Not on file  . Physically Abused: Not on file  . Sexually Abused: Not on file    Past Surgical History:  Procedure Laterality Date  . CARPAL TUNNEL RELEASE    . CESAREAN SECTION     1985  . CESAREAN SECTION  68    Family History  Problem Relation Age of Onset  . Pancreatic cancer Mother   . Heart disease Father   . Heart attack Father     Allergies  Allergen Reactions  . Ibuprofen Swelling  . Amoxicillin Hives    Has patient had a PCN reaction causing immediate rash, facial/tongue/throat swelling, SOB or lightheadedness with hypotension: No Has patient had a PCN reaction causing severe rash involving mucus membranes or skin necrosis: No Has patient had a PCN reaction that required hospitalization: No Has patient had a PCN reaction occurring within the last 10 years: No If all of the above answers are "NO", then may proceed with Cephalosporin use.\    Current Outpatient Medications on File Prior to Visit  Medication Sig Dispense  Refill  . albuterol (VENTOLIN HFA) 108 (90 Base) MCG/ACT inhaler Inhale 2 puffs into the lungs every 6 (six) hours as needed for wheezing or shortness of breath. 6.7 g 3  . aspirin (ASPIRIN LOW DOSE) 81 MG EC tablet Take 1 tablet (81 mg total) by mouth daily. 90 tablet 3  . losartan (COZAAR) 50 MG tablet Take 1 tablet (50 mg total) by mouth daily. 30 tablet 11  . MELATONIN PO Take 1 tablet by mouth at bedtime.    . nicotine (NICOTROL) 10 MG inhaler Inhale 1 Cartridge (1 continuous puffing total) into the lungs as needed for smoking cessation. 8-10 puffs daily 42 each 0  . omeprazole (PRILOSEC) 40 MG capsule Take 1 capsule (40 mg total) by mouth daily. 30 capsule 11  . umeclidinium-vilanterol (ANORO ELLIPTA) 62.5-25 MCG/INH AEPB INHALE 1 PUFF BY MOUTH EVERY DAY 60 each 3   No current facility-administered medications on file prior to visit.    BP 135/70   Pulse 78   Ht 5\' 4"  (1.626 m)   Wt 117 lb 9.6 oz (53.3 kg)   SpO2 98%   BMI 20.19 kg/m       Objective:   Physical Exam  General Mental Status- Alert. General Appearance- Not in acute distress.   Skin Rt arm- scattered moderat bruising. Left arm- small bruises.  Neck Carotid Arteries- Normal color. Moisture- Normal Moisture. No carotid bruits. No JVD.  Chest and Lung Exam Auscultation: Breath Sounds:-Normal.  Cardiovascular Auscultation:Rythm- Regular. Murmurs & Other Heart Sounds:Auscultation of the heart reveals- No Murmurs.  Abdomen Inspection:-Inspeection Normal. Palpation/Percussion:Note:No mass. Palpation and Percussion of the abdomen reveal- Non Tender, Non Distended + BS, no rebound or guarding.   Neurologic Cranial Nerve exam:- CN III-XII intact(No nystagmus), symmetric smile. Strength:- 5/5 equal and symmetric strength both upper and lower extremities.     Assessment & Plan:  Your blood pressure is well controlled today.  Continue losartan.  CMP lab order today.  History of GERD and symptoms controlled  with omeprazole.  Also encouraged healthy diet.  Recent easy bruising of upper extremities.  Will get CBC today.  History of CVA.  Recommend continuing aspirin.  Continue atorvastatin. Gave rx today.   For history of COPD continue current inhalers.  Counseled on benefits versus risk Covid vaccine.  Do think in your age group with Covid risk complication score that you should get vaccinated.  I would recommend moderna or .  Follow-up in 3 months or as needed.  [employer], PA-C

## 2020-06-25 LAB — COMPREHENSIVE METABOLIC PANEL
AG Ratio: 1.3 (calc) (ref 1.0–2.5)
ALT: 63 U/L — ABNORMAL HIGH (ref 6–29)
AST: 42 U/L — ABNORMAL HIGH (ref 10–35)
Albumin: 3.6 g/dL (ref 3.6–5.1)
Alkaline phosphatase (APISO): 115 U/L (ref 37–153)
BUN: 10 mg/dL (ref 7–25)
CO2: 27 mmol/L (ref 20–32)
Calcium: 9.3 mg/dL (ref 8.6–10.4)
Chloride: 104 mmol/L (ref 98–110)
Creat: 0.52 mg/dL (ref 0.50–0.99)
Globulin: 2.8 g/dL (calc) (ref 1.9–3.7)
Glucose, Bld: 81 mg/dL (ref 65–99)
Potassium: 4.4 mmol/L (ref 3.5–5.3)
Sodium: 139 mmol/L (ref 135–146)
Total Bilirubin: 0.6 mg/dL (ref 0.2–1.2)
Total Protein: 6.4 g/dL (ref 6.1–8.1)

## 2020-06-25 LAB — CBC WITH DIFFERENTIAL/PLATELET
Absolute Monocytes: 788 cells/uL (ref 200–950)
Basophils Absolute: 20 cells/uL (ref 0–200)
Basophils Relative: 0.5 %
Eosinophils Absolute: 51 cells/uL (ref 15–500)
Eosinophils Relative: 1.3 %
HCT: 38.7 % (ref 35.0–45.0)
Hemoglobin: 13.2 g/dL (ref 11.7–15.5)
Lymphs Abs: 998 cells/uL (ref 850–3900)
MCH: 34.6 pg — ABNORMAL HIGH (ref 27.0–33.0)
MCHC: 34.1 g/dL (ref 32.0–36.0)
MCV: 101.3 fL — ABNORMAL HIGH (ref 80.0–100.0)
MPV: 11.2 fL (ref 7.5–12.5)
Monocytes Relative: 20.2 %
Neutro Abs: 2044 cells/uL (ref 1500–7800)
Neutrophils Relative %: 52.4 %
Platelets: 178 10*3/uL (ref 140–400)
RBC: 3.82 10*6/uL (ref 3.80–5.10)
RDW: 12.6 % (ref 11.0–15.0)
Total Lymphocyte: 25.6 %
WBC: 3.9 10*3/uL (ref 3.8–10.8)

## 2020-06-26 ENCOUNTER — Other Ambulatory Visit: Payer: Self-pay | Admitting: Medical

## 2020-07-25 ENCOUNTER — Telehealth: Payer: Self-pay | Admitting: Medical

## 2020-07-25 MED ORDER — ATORVASTATIN CALCIUM 80 MG PO TABS: 80.0000 mg | ORAL_TABLET | Freq: Every day | ORAL | 3 refills | Status: DC

## 2020-07-25 NOTE — Telephone Encounter (Signed)
Pt called and script sent

## 2020-07-25 NOTE — Telephone Encounter (Signed)
Patient states on her last visit in October she was given a written prescription, she states she took it to the pharmacy but the Pharm states they never got it.  Patient is requesting that a new script be sent over to phamr to be filled for her

## 2020-07-25 NOTE — Addendum Note (Signed)
Addended by: Maximino Sarin on: 07/25/2020 08:35 AM   Modules accepted: Orders

## 2020-09-23 ENCOUNTER — Ambulatory Visit: Payer: 59 | Admitting: Medical

## 2020-09-29 ENCOUNTER — Telehealth (INDEPENDENT_AMBULATORY_CARE_PROVIDER_SITE_OTHER): Payer: 59 | Admitting: Medical

## 2020-09-29 ENCOUNTER — Other Ambulatory Visit: Payer: Self-pay

## 2020-09-29 VITALS — Temp 98.7°F

## 2020-09-29 DIAGNOSIS — R059 Cough, unspecified: Secondary | ICD-10-CM

## 2020-09-29 DIAGNOSIS — M791 Myalgia, unspecified site: Secondary | ICD-10-CM

## 2020-09-29 DIAGNOSIS — B349 Viral infection, unspecified: Secondary | ICD-10-CM | POA: Diagnosis not present

## 2020-09-29 DIAGNOSIS — J019 Acute sinusitis, unspecified: Secondary | ICD-10-CM

## 2020-09-29 MED ORDER — FLUTICASONE PROPIONATE 50 MCG/ACT NA SUSP: 2.0000 | Freq: Every day | NASAL | 0 refills | Status: DC

## 2020-09-29 MED ORDER — BENZONATATE 100 MG PO CAPS: 100.0000 mg | ORAL_CAPSULE | Freq: Three times a day (TID) | ORAL | 0 refills | Status: DC | PRN

## 2020-09-29 MED ORDER — DOXYCYCLINE HYCLATE 100 MG PO TABS
100.0000 mg | ORAL_TABLET | Freq: Two times a day (BID) | ORAL | 0 refills | Status: DC
Start: 2020-09-29 — End: 2021-06-19

## 2020-09-29 NOTE — Patient Instructions (Signed)
Viral syndrome with concern for sinus infection.  Possible false negative early pcr test.  Recommend rest, hydrate and tylenol for fever.  Recommend try to get rapid test over the counter and to both test. Let me know the results.  Rx doxycycline antibiotic, benzonatate cough med and flonase nasal spray.  Stay off from work pending rapid test results as well as need time to determine response to above treatment.   Follow up 7 days or as needed.

## 2020-09-29 NOTE — Progress Notes (Signed)
   Subjective:    Patient ID: Erin Sloan, female    DOB: 17-Jul-1961, 60 y.o.   MRN: [id number]  HPI  Virtual Visit via Video Note  I connected with Erin Sloan on 09/29/20 at 10:20 AM EST by a video enabled telemedicine application and verified that I am speaking with the correct person using two identifiers.  Location: Patient: home Provider: office  Participants- pt and myself.   I discussed the limitations of evaluation and management by telemedicine and the availability of in person appointments. The patient expressed understanding and agreed to proceed.  History of Present Illness: Pt states Sunday before last she got sick. Pt states she went to UC in [place] and test was negative for covid.  Pt states symptoms for 10 days. Tested negative for covid past Tuesday. Given results 2 days later. PCR test was done. Pt has not had covid vaccine. She did have body aches at onset. Pt states smell decreased slightly. Change in taste some.   Pt does have mild ha. At [facility] she states bp was not high.  Pt does not have 02 sat monitor. No wheezing or shortness of breath.  Pt does have nasal congestion and cough.     Observations/Objective: General-no acute distress, pleasant, oriented. Lungs- on inspection lungs appear unlabored. Neck- no tracheal deviation or jvd on inspection. Neuro- gross motor function appears intact.  Assessment and Plan: Viral syndrome with concern for sinus infection.  Possible false negative early pcr test.  Recommend rest, hydrate and tylenol for fever.  Recommend try to get rapid test over the counter and to both test. Let me know the results.  Rx doxycycline antibiotic, benzonatate cough med and flonase nasal spray.  Stay off from work pending rapid test results as well as need time to determine response to above treatment.   Follow up 7 days or as needed.  [name], PA-C  Follow Up Instructions:    I discussed the assessment and  treatment plan with the patient. The patient was provided an opportunity to ask questions and all were answered. The patient agreed with the plan and demonstrated an understanding of the instructions.   The patient was advised to call back or seek an in-person evaluation if the symptoms worsen or if the condition fails to improve as anticipated.     [name], PA-C   Time spent with patient today was 30  minutes which consisted of chart review, discussing diagnosis, work up, treatment and documentation.   Review of Systems     Objective:   Physical Exam        Assessment & Plan:

## 2020-10-19 ENCOUNTER — Other Ambulatory Visit: Payer: Self-pay | Admitting: *Deleted

## 2020-10-19 DIAGNOSIS — F17210 Nicotine dependence, cigarettes, uncomplicated: Secondary | ICD-10-CM

## 2020-10-21 ENCOUNTER — Other Ambulatory Visit: Payer: Self-pay | Admitting: Medical

## 2020-10-21 ENCOUNTER — Telehealth: Payer: Self-pay

## 2020-10-21 NOTE — Telephone Encounter (Signed)
Caller states that she cannot move left or right due to a pulled muscle in her shoulder and neck.  Telephone: [phone]

## 2020-10-21 NOTE — Telephone Encounter (Signed)
Called pt and lvm to return call to schedule an appt 

## 2020-10-21 NOTE — Telephone Encounter (Signed)
Patient states she does not want an appointment, she wants a muscle relaxer. (Patient decline appt stating I just call back)

## 2020-10-21 NOTE — Telephone Encounter (Signed)
Erin Sloan- I'm unsure if she is wanting an appt? Below is all the after hours call said.

## 2020-10-22 NOTE — Telephone Encounter (Signed)
Reviewed this request. She needs to make appointment. Just can't call in muscle relaxant. Need office visit to discuss and then determine treatment plan.

## 2020-10-24 NOTE — Telephone Encounter (Signed)
Called pt and lvm to return call.  

## 2020-10-25 NOTE — Telephone Encounter (Signed)
Called patient and stated she cant afford a doctor's appointment due to being out of work but wants muscle relaxer.  Right shoulder , injury 2014 .Mar[facility] Kitchen   Wants recommendations

## 2020-10-25 NOTE — Telephone Encounter (Signed)
I'm sorry but really can speculate on condition without exam/interview. Seeing sports med maybe option but that would require evaluation/cost would be involved.

## 2020-10-28 NOTE — Telephone Encounter (Signed)
Left message on machine that she must have an office visit before any medication can be prescribed

## 2020-11-06 ENCOUNTER — Other Ambulatory Visit: Payer: Self-pay | Admitting: Medical

## 2020-11-14 ENCOUNTER — Other Ambulatory Visit: Payer: Self-pay

## 2020-11-14 ENCOUNTER — Ambulatory Visit (INDEPENDENT_AMBULATORY_CARE_PROVIDER_SITE_OTHER): Payer: 59 | Admitting: Acute Care

## 2020-11-14 ENCOUNTER — Encounter: Payer: Self-pay | Admitting: Acute Care

## 2020-11-14 DIAGNOSIS — F17210 Nicotine dependence, cigarettes, uncomplicated: Secondary | ICD-10-CM | POA: Diagnosis not present

## 2020-11-14 DIAGNOSIS — Z122 Encounter for screening for malignant neoplasm of respiratory organs: Secondary | ICD-10-CM | POA: Diagnosis not present

## 2020-11-14 NOTE — Patient Instructions (Signed)
Thank you for participating in the [facility] Scre[facility]. It was our pleasure to meet you today. We will call you with the results of your scan within the next few days. Your scan will be assigned a Lung RADS category score by the physicians reading the scans.  This Lung RADS score determines follow up scanning.  See below for description of categories, and follow up screening recommendations. We will be in touch to schedule your follow up screening annually or based on recommendations of our providers. We will fax a copy of your scan results to your Primary Care Physician, or the physician who referred you to the program, to ensure they have the results. Please call the office if you have any questions or concerns regarding your scanning experience or results.  Our office number is [phone]. Please speak with Denise Phelps, RN. She is our Lung Cancer Screening RN. If she is unavailable when you call, please have the office staff send her a message. She will return your call at her earliest convenience. Remember, if your scan is normal, we will scan you annually as long as you continue to meet the criteria for the program. (Age 55-77, Current smoker or smoker who has quit within the last 15 years). If you are a smoker, remember, quitting is the single most powerful action that you can take to decrease your risk of lung cancer and other pulmonary, breathing related problems. We know quitting is hard, and we are here to help.  Please let us know if there is anything we can do to help you meet your goal of quitting. If you are a former smoker, congratulations. We are proud of you! Remain smoke free! Remember you can refer friends or family members through the number above.  We will screen them to make sure they meet criteria for the program. Thank you for helping us take better care of you by participating in Lung Screening.  Lung RADS Categories:  Lung RADS 1: no nodules  or definitely non-concerning nodules.  Recommendation is for a repeat annual scan in 12 months.  Lung RADS 2:  nodules that are non-concerning in appearance and behavior with a very low likelihood of becoming an active cancer. Recommendation is for a repeat annual scan in 12 months.  Lung RADS 3: nodules that are probably non-concerning , includes nodules with a low likelihood of becoming an active cancer.  Recommendation is for a 6-month repeat screening scan. Often noted after an upper respiratory illness. We will be in touch to make sure you have no questions, and to schedule your 6-month scan.  Lung RADS 4 A: nodules with concerning findings, recommendation is most often for a follow up scan in 3 months or additional testing based on our provider's assessment of the scan. We will be in touch to make sure you have no questions and to schedule the recommended 3 month follow up scan.  Lung RADS 4 B:  indicates findings that are concerning. We will be in touch with you to schedule additional diagnostic testing based on our provider's  assessment of the scan.   

## 2020-11-14 NOTE — Progress Notes (Addendum)
Virtual Visit via Telephone Note  I connected with Erin Sloan on 11/14/20 at  9:00 AM EST by telephone and verified that I am speaking with the correct person using two identifiers.  Location: Patient: At home Provider: [address] [place], [place], [address]   I discussed the limitations, risks, security and privacy concerns of performing an evaluation and management service by telephone and the availability of in person appointments. I also discussed with the patient that there may be a patient responsible charge related to this service. The patient expressed understanding and agreed to proceed.    Shared Decision Making Visit Lung Cancer Screening Program [phone])   Eligibility:  Age 60 y.o.  Pack Years Smoking History Calculation 72 pack year smoking history (# packs/per year x # years smoked)  Recent History of coughing up blood  no  Unexplained weight loss? no ( >Than 15 pounds within the last 6 months )  Prior History Lung / other cancer no (Diagnosis within the last 5 years already requiring surveillance chest CT Scans).  Smoking Status Current Smoker  Former Smokers: Years since quit: NA  Quit Date: NA  Visit Components:  Discussion included one or more decision making aids. yes  Discussion included risk/benefits of screening. yes  Discussion included potential follow up diagnostic testing for abnormal scans. yes  Discussion included meaning and risk of over diagnosis. yes  Discussion included meaning and risk of False Positives. yes  Discussion included meaning of total radiation exposure. yes  Counseling Included:  Importance of adherence to annual lung cancer LDCT screening. yes  Impact of comorbidities on ability to participate in the program. yes  Ability and willingness to under diagnostic treatment. yes  Smoking Cessation Counseling:  Current Smokers:   Discussed importance of smoking cessation. yes  Information about  tobacco cessation classes and interventions provided to patient. yes  Patient provided with "ticket" for LDCT Scan. yes  Symptomatic Patient. no  Counseling NA  Diagnosis Code: Tobacco Use Z72.0  Asymptomatic Patient yes  Counseling (Intermediate counseling: > three minutes counseling) T5974  Former Smokers:   Discussed the importance of maintaining cigarette abstinence. yes  Diagnosis Code: Personal History of Nicotine Dependence. B63.845  Information about tobacco cessation classes and interventions provided to patient. Yes  Patient provided with "ticket" for LDCT Scan. yes  Written Order for Lung Cancer Screening with LDCT placed in Epic. Yes (CT Chest Lung Cancer Screening Low Dose W/O CM) [id number] Z12.2-Screening of respiratory organs Z87.891-Personal history of nicotine dependence  I have spent 25 minutes of face to face time with Erin Sloan discussing the risks and benefits of lung cancer screening. We viewed a power point together that explained in detail the above noted topics. We paused at intervals to allow for questions to be asked and answered to ensure understanding.We discussed that the single most powerful action that she can take to decrease her risk of developing lung cancer is to quit smoking. We discussed whether or not she is ready to commit to setting a quit date. We discussed options for tools to aid in quitting smoking including nicotine replacement therapy, non-nicotine medications, support groups, Quit Smart classes, and behavior modification. We discussed that often times setting smaller, more achievable goals, such as eliminating 1 cigarette a day for a week and then 2 cigarettes a day for a week can be helpful in slowly decreasing the number of cigarettes smoked. This allows for a sense of accomplishment as well as providing a clinical  benefit. I gave her the " Be Stronger Than Your Excuses" card with contact information for community resources, classes, free  nicotine replacement therapy, and access to mobile apps, text messaging, and on-line smoking cessation help. I have also given her my card and contact information in the event she needs to contact me. We discussed the time and location of the scan, and that either Abigail Miyamoto RN or I will call with the results within 24-48 hours of receiving them. I have offered her  a copy of the power point we viewed  as a resource in the event they need reinforcement of the concepts we discussed today in the office. The patient verbalized understanding of all of  the above and had no further questions upon leaving the office. They have my contact information in the event they have any further questions.  I spent 4 minutes counseling on smoking cessation and the health risks of continued tobacco abuse.  I explained to the patient that there has been a high incidence of coronary artery disease noted on these exams. I explained that this is a non-gated exam therefore degree or severity cannot be determined. This patient is currently on statin therapy. I have asked the patient to follow-up with their PCP regarding any incidental finding of coronary artery disease and management with diet or medication as their PCP  feels is clinically indicated. The patient verbalized understanding of the above and had no further questions upon completion of the visit.      Bevelyn Ngo, NP 11/14/2020 1:31 PM   Addendum 11/28/2020 ( Scanned 11/14/2020) Fax received from [facility].    Results Called to patient by Abigail Miyamoto RN on 11/21/2020 ( See Telephone Note)  Annual scan order placed.   Bevelyn Ngo, MSN, AGACNP-BC [facility] See Amion for personal pager PCCM on call pager [phone] 11/28/2020 3:52 PM

## 2020-11-18 ENCOUNTER — Telehealth: Payer: Self-pay | Admitting: Acute Care

## 2020-11-18 DIAGNOSIS — F17210 Nicotine dependence, cigarettes, uncomplicated: Secondary | ICD-10-CM

## 2020-11-21 NOTE — Telephone Encounter (Signed)
Spoke with pt and advised of Chest CT results done at [facility] on 11/14/20. Pt verbalized understanding. Order placed for 1 yr f/u low dose ct. Nothing further needed.

## 2020-12-27 ENCOUNTER — Telehealth: Payer: Self-pay | Admitting: Acute Care

## 2020-12-27 NOTE — Telephone Encounter (Signed)
Please call patient and let them  know their  low dose Ct was read as a Lung RADS 2: nodules that are benign in appearance and behavior with a very low likelihood of becoming a clinically active cancer due to size or lack of growth. Recommendation per radiology is for a repeat LDCT in 12 months..Please let them  know we will order and schedule their  annual screening scan for 12/2021. Please let them  know there was notation of CAD on their  scan.  Please remind the patient  that this is a non-gated exam therefore degree or severity of disease  cannot be determined. Please have them  follow up with their PCP regarding potential risk factor modification, dietary therapy or pharmacologic therapy if clinically indicated. Pt.  is  currently on statin therapy. Please place order for annual  screening scan for  12/2021 and fax results to PCP. Thanks so much.  Please have them follow up with PCP re: CAD. Thanks

## 2020-12-27 NOTE — Telephone Encounter (Signed)
See telephone note 11/18/20.  

## 2021-02-10 ENCOUNTER — Other Ambulatory Visit: Payer: Self-pay | Admitting: Medical

## 2021-04-23 ENCOUNTER — Other Ambulatory Visit: Payer: Self-pay | Admitting: Medical

## 2021-04-23 ENCOUNTER — Other Ambulatory Visit: Payer: Self-pay | Admitting: Pulmonary Disease

## 2021-05-08 ENCOUNTER — Other Ambulatory Visit: Payer: Self-pay | Admitting: Medical

## 2021-05-12 ENCOUNTER — Other Ambulatory Visit: Payer: Self-pay | Admitting: Medical

## 2021-06-19 ENCOUNTER — Ambulatory Visit: Payer: 59 | Admitting: Family Medicine

## 2021-06-19 ENCOUNTER — Other Ambulatory Visit: Payer: Self-pay

## 2021-06-19 ENCOUNTER — Encounter: Payer: Self-pay | Admitting: Family Medicine

## 2021-06-19 VITALS — BP 132/80 | HR 79 | Temp 98.4°F | Resp 18 | Ht 64.0 in | Wt 130.6 lb

## 2021-06-19 DIAGNOSIS — J0140 Acute pansinusitis, unspecified: Secondary | ICD-10-CM

## 2021-06-19 MED ORDER — METHYLPREDNISOLONE ACETATE 80 MG/ML IJ SUSP
80.0000 mg | Freq: Once | INTRAMUSCULAR | Status: AC
  Administered 2021-06-19: 80 mg via INTRAMUSCULAR

## 2021-06-19 MED ORDER — DOXYCYCLINE HYCLATE 100 MG PO TABS
100.0000 mg | ORAL_TABLET | Freq: Two times a day (BID) | ORAL | 0 refills | Status: DC
Start: 2021-06-19 — End: 2021-11-29

## 2021-06-19 NOTE — Progress Notes (Signed)
Subjective:   By signing my name below, I, Erin Sloan, attest that this documentation has been prepared under the direction and in the presence of Erin Schultz, DO. 06/19/2021     Patient ID: Erin Sloan, female    DOB: 1961/08/03, 60 y.o.   MRN: [id number]  Chief Complaint  Patient presents with   Sinus Problem    Pt states sxs started last week and tested Negative COVID this morning. Pt states having drainage, cough, congestion, and headache.     HPI Patient is in today for an office visit.  She reports she has had a sinus infection since last week Tuesday. ana mentions having them every year around the fall season. Symptoms include headache, sinus pressure, drainage and sore throat. Denies fevers She was given doxycycline last year to manage the infection and says that Flonase makes it worse. She mentions that tylenol does not provide any relief. She also took a Covid-19 test and it was negative.  Past Medical History:  Diagnosis Date   COPD (chronic obstructive pulmonary disease) (HCC)    Depression    Head ache     Past Surgical History:  Procedure Laterality Date   CARPAL TUNNEL RELEASE     CESAREAN SECTION     1985   CESAREAN SECTION     1987    Family History  Problem Relation Age of Onset   Pancreatic cancer Mother    Heart disease Father    Heart attack Father     Social History   Socioeconomic History   Marital status: Widowed    Spouse name: Not on file   Number of children: Not on file   Years of education: Not on file   Highest education level: Not on file  Occupational History   Not on file  Tobacco Use   Smoking status: Every Day    Packs/day: 1.50    Years: 48.00    Pack years: 72.00    Types: Cigarettes   Smokeless tobacco: Never   Tobacco comments:    half pack daily-AH/11/14/2020  Substance and Sexual Activity   Alcohol use: Yes    Comment: socially. moderate now.  years ago drank heavier on weekends.   Drug use: No    Sexual activity: Not Currently  Other Topics Concern   Not on file  Social History Narrative   Not on file   Social Determinants of Health   Financial Resource Strain: Not on file  Food Insecurity: Not on file  Transportation Needs: Not on file  Physical Activity: Not on file  Stress: Not on file  Social Connections: Not on file  Intimate Partner Violence: Not on file    Outpatient Medications Prior to Visit  Medication Sig Dispense Refill   albuterol (VENTOLIN HFA) 108 (90 Base) MCG/ACT inhaler TAKE 2 PUFFS BY MOUTH EVERY 6 HOURS AS NEEDED FOR WHEEZE OR SHORTNESS OF BREATH 6.7 each 3   ANORO ELLIPTA 62.5-25 MCG/INH AEPB INHALE 1 PUFF BY MOUTH EVERY DAY 60 each 3   aspirin 81 MG EC tablet TAKE 1 TABLET BY MOUTH EVERY DAY 120 tablet 1   atorvastatin (LIPITOR) 80 MG tablet Take 1 tablet (80 mg total) by mouth daily. 90 tablet 3   fluticasone (FLONASE) 50 MCG/ACT nasal spray SPRAY 2 SPRAYS INTO EACH NOSTRIL EVERY DAY 48 mL 1   losartan (COZAAR) 50 MG tablet TAKE 1 TABLET BY MOUTH EVERY DAY 90 tablet 0   MELATONIN PO Take 1  tablet by mouth at bedtime.     nicotine (NICOTROL) 10 MG inhaler Inhale 1 Cartridge (1 continuous puffing total) into the lungs as needed for smoking cessation. 8-10 puffs daily 42 each 0   omeprazole (PRILOSEC) 40 MG capsule TAKE 1 CAPSULE BY MOUTH EVERY DAY 90 capsule 0   benzonatate (TESSALON) 100 MG capsule Take 1 capsule (100 mg total) by mouth 3 (three) times daily as needed for cough. (Patient not taking: Reported on 06/19/2021) 30 capsule 0   doxycycline (VIBRA-TABS) 100 MG tablet Take 1 tablet (100 mg total) by mouth 2 (two) times daily. Can give caps or generic. (Patient not taking: Reported on 06/19/2021) 14 tablet 0   No facility-administered medications prior to visit.    Allergies  Allergen Reactions   Ibuprofen Swelling   Amoxicillin Hives    Has patient had a PCN reaction causing immediate rash, facial/tongue/throat swelling, SOB or  lightheadedness with hypotension: No Has patient had a PCN reaction causing severe rash involving mucus membranes or skin necrosis: No Has patient had a PCN reaction that required hospitalization: No Has patient had a PCN reaction occurring within the last 10 years: No If all of the above answers are "NO", then may proceed with Cephalosporin use.\    Review of Systems  Constitutional:  Negative for fever.  HENT:  Positive for sinus pain and sore throat. Negative for congestion, ear pain and hearing loss.        (+) drainage   Eyes:  Negative for blurred vision and pain.  Respiratory:  Negative for cough, sputum production, shortness of breath and wheezing.   Cardiovascular:  Negative for chest pain and palpitations.  Gastrointestinal:  Negative for blood in stool, constipation, diarrhea, nausea and vomiting.  Genitourinary:  Negative for dysuria, frequency, hematuria and urgency.  Musculoskeletal:  Negative for back pain, falls and myalgias.  Neurological:  Positive for headaches. Negative for dizziness, sensory change, loss of consciousness and weakness.  Endo/Heme/Allergies:  Negative for environmental allergies. Does not bruise/bleed easily.  Psychiatric/Behavioral:  Negative for depression and suicidal ideas. The patient is not nervous/anxious and does not have insomnia.       Objective:    Physical Exam Vitals and nursing note reviewed.  Constitutional:      General: She is not in acute distress.    Appearance: Normal appearance. She is not ill-appearing.  HENT:     Head: Normocephalic and atraumatic.     Right Ear: External ear normal.     Left Ear: External ear normal.  Eyes:     Extraocular Movements: Extraocular movements intact.     Pupils: Pupils are equal, round, and reactive to light.  Cardiovascular:     Rate and Rhythm: Normal rate and regular rhythm.     Pulses: Normal pulses.     Heart sounds: Normal heart sounds. No murmur heard.   No gallop.  Pulmonary:      Effort: Pulmonary effort is normal. No respiratory distress.     Breath sounds: Normal breath sounds. No wheezing, rhonchi or rales.  Abdominal:     General: Bowel sounds are normal. There is no distension.     Palpations: Abdomen is soft. There is no mass.     Tenderness: There is no abdominal tenderness. There is no guarding or rebound.     Hernia: No hernia is present.  Musculoskeletal:     Cervical back: Normal range of motion and neck supple.  Lymphadenopathy:     Cervical: No cervical  adenopathy.  Skin:    General: Skin is warm and dry.  Neurological:     Mental Status: She is alert and oriented to person, place, and time.  Psychiatric:        Mood and Affect: Mood normal.        Behavior: Behavior normal.        Thought Content: Thought content normal.    BP 132/80 (BP Location: Left Arm, Patient Position: Sitting, Cuff Size: Normal)   Pulse 79   Temp 98.4 F (36.9 C) (Oral)   Resp 18   Ht 5\' 4"  (1.626 m)   Wt 130 lb 9.6 oz (59.2 kg)   SpO2 95%   BMI 22.42 kg/m  Wt Readings from Last 3 Encounters:  06/19/21 130 lb 9.6 oz (59.2 kg)  06/24/20 117 lb 9.6 oz (53.3 kg)  03/28/20 113 lb 3.2 oz (51.3 kg)    Diabetic Foot Exam - Simple   No data filed    Lab Results  Component Value Date   WBC 3.9 06/24/2020   HGB 13.2 06/24/2020   HCT 38.7 06/24/2020   PLT 178 06/24/2020   GLUCOSE 81 06/24/2020   CHOL 184 11/19/2019   TRIG 63 11/19/2019   HDL 82 11/19/2019   LDLCALC 89 11/19/2019   ALT 63 (H) 06/24/2020   AST 42 (H) 06/24/2020   NA 139 06/24/2020   K 4.4 06/24/2020   CL 104 06/24/2020   CREATININE 0.52 06/24/2020   BUN 10 06/24/2020   CO2 27 06/24/2020   TSH 1.20 02/23/2020   INR 0.9 11/18/2019   HGBA1C 5.5 11/19/2019    Lab Results  Component Value Date   TSH 1.20 02/23/2020   Lab Results  Component Value Date   WBC 3.9 06/24/2020   HGB 13.2 06/24/2020   HCT 38.7 06/24/2020   MCV 101.3 (H) 06/24/2020   PLT 178 06/24/2020   Lab Results   Component Value Date   NA 139 06/24/2020   K 4.4 06/24/2020   CO2 27 06/24/2020   GLUCOSE 81 06/24/2020   BUN 10 06/24/2020   CREATININE 0.52 06/24/2020   BILITOT 0.6 06/24/2020   ALKPHOS 83 02/23/2020   AST 42 (H) 06/24/2020   ALT 63 (H) 06/24/2020   PROT 6.4 06/24/2020   ALBUMIN 3.9 02/23/2020   CALCIUM 9.3 06/24/2020   ANIONGAP 10 11/18/2019   GFR 103.68 02/23/2020   Lab Results  Component Value Date   CHOL 184 11/19/2019   Lab Results  Component Value Date   HDL 82 11/19/2019   Lab Results  Component Value Date   LDLCALC 89 11/19/2019   Lab Results  Component Value Date   TRIG 63 11/19/2019   Lab Results  Component Value Date   CHOLHDL 2.2 11/19/2019   Lab Results  Component Value Date   HGBA1C 5.5 11/19/2019       Assessment & Plan:   Problem List Items Addressed This Visit       Unprioritized   SINUSITIS - ACUTE-NOS - Primary   Relevant Medications   doxycycline (VIBRA-TABS) 100 MG tablet        Saline spray Pt does not like flonase  Meds ordered this encounter  Medications   doxycycline (VIBRA-TABS) 100 MG tablet    Sig: Take 1 tablet (100 mg total) by mouth 2 (two) times daily.    Dispense:  20 tablet    Refill:  0   methylPREDNISolone acetate (DEPO-MEDROL) injection 80 mg    I,Erin  Sloan,acting as a Neurosurgeon for [employer], DO.,have documented all relevant documentation on the behalf of Erin Schultz, DO,as directed by  Erin Schultz, DO while in the presence of Erin Schultz, DO.   I, Erin Schultz, DO., personally preformed the services described in this documentation.  All medical record entries made by the scribe were at my direction and in my presence.  I have reviewed the chart and discharge instructions (if applicable) and agree that the record reflects my personal performance and is accurate and complete. 06/19/2021

## 2021-06-19 NOTE — Patient Instructions (Signed)

## 2021-08-10 ENCOUNTER — Other Ambulatory Visit: Payer: Self-pay | Admitting: Medical

## 2021-08-10 ENCOUNTER — Other Ambulatory Visit: Payer: Self-pay | Admitting: Pulmonary Disease

## 2021-08-18 ENCOUNTER — Other Ambulatory Visit: Payer: Self-pay | Admitting: Medical

## 2021-08-26 ENCOUNTER — Other Ambulatory Visit: Payer: Self-pay | Admitting: Pulmonary Disease

## 2021-10-27 ENCOUNTER — Other Ambulatory Visit: Payer: Self-pay | Admitting: Pulmonary Disease

## 2021-11-03 ENCOUNTER — Other Ambulatory Visit: Payer: Self-pay | Admitting: Medical

## 2021-11-27 ENCOUNTER — Other Ambulatory Visit: Payer: Self-pay | Admitting: Pulmonary Disease

## 2021-11-27 ENCOUNTER — Telehealth: Payer: Self-pay | Admitting: Pulmonary Disease

## 2021-11-28 NOTE — Telephone Encounter (Signed)
Patient last seen 05/2020. ?Appt is needed prior to refills. ? ?Lm for patient.  ? ?

## 2021-11-29 ENCOUNTER — Encounter: Payer: Self-pay | Admitting: Pulmonary Disease

## 2021-11-29 ENCOUNTER — Ambulatory Visit: Payer: 59 | Admitting: Pulmonary Disease

## 2021-11-29 ENCOUNTER — Other Ambulatory Visit: Payer: Self-pay

## 2021-11-29 VITALS — BP 124/68 | HR 71 | Temp 98.5°F | Ht 64.0 in | Wt 126.8 lb

## 2021-11-29 DIAGNOSIS — J432 Centrilobular emphysema: Secondary | ICD-10-CM

## 2021-11-29 DIAGNOSIS — R918 Other nonspecific abnormal finding of lung field: Secondary | ICD-10-CM | POA: Diagnosis not present

## 2021-11-29 DIAGNOSIS — F17210 Nicotine dependence, cigarettes, uncomplicated: Secondary | ICD-10-CM | POA: Diagnosis not present

## 2021-11-29 DIAGNOSIS — Z720 Tobacco use: Secondary | ICD-10-CM | POA: Diagnosis not present

## 2021-11-29 MED ORDER — ANORO ELLIPTA 62.5-25 MCG/ACT IN AEPB: INHALATION_SPRAY | RESPIRATORY_TRACT | 11 refills | Status: DC

## 2021-11-29 NOTE — Assessment & Plan Note (Signed)
Smoking cessation again emphasized is the most important intervention that would add years to her life.  She is not ready to commit to a quit date ?

## 2021-11-29 NOTE — Progress Notes (Signed)
? ?  Subjective:  ? ? Patient ID: Erin Sloan, female    DOB: December 09, 1960, 61 y.o.   MRN: [id number] ? ?HPI ? ?61 yo smoker for follow-up of emphysema and pulmonary nodules ?  ?PMH - hospitalized 11/2019 for right-sided numbness ,stroke on MRI. ?She continues to smoke half pack per day. ? ?Chief Complaint  ?Patient presents with  ? Follow-up  ?  LOV 2021. Pt states she is here to get a refill of her for her Anoro. Pt states when she eats she is having issues with her breathing.   ? ?Reviewed last office visit from 03/2020. ?She continues to smoke about half pack per day. ?She ran out of her note about 3 days ago and feels more short of breath. ?We reviewed low-dose CT scan from March 2022 which showed stable nodules and coronary artery calcifications ?  ?Significant tests/ events reviewed ?  ?Spirometry 09/2018 ratio 71, FEV1 78%, FVC 84% ?  ? ?LDCT chest 11/2020 emphysema, coronary calcification ?CT chest 1/ 2021 showed emphysema.  1-2 mm right upper lobe nodule peripherally stable ?CT chest angiogram 08/2018 moderate emphysema, 2 to 3 mm nodules ? ? ?Past Medical History:  ?Diagnosis Date  ? COPD (chronic obstructive pulmonary disease) ([place])   ? Depression   ? Head ache   ? ? ? ?Review of Systems ?neg for any significant sore throat, dysphagia, itching, sneezing, nasal congestion or excess/ purulent secretions, fever, chills, sweats, unintended wt loss, pleuritic or exertional cp, hempoptysis, orthopnea pnd or change in chronic leg swelling. Also denies presyncope, palpitations, heartburn, abdominal pain, nausea, vomiting, diarrhea or change in bowel or urinary habits, dysuria,hematuria, rash, arthralgias, visual complaints, headache, numbness weakness or ataxia. ? ?   ?Objective:  ? Physical Exam ? ?Gen. Pleasant, well-nourished, in no distress ?ENT - no thrush, no pallor/icterus,no post nasal drip ?Neck: No JVD, no thyromegaly, no carotid bruits ?Lungs: no use of accessory muscles, no dullness to percussion, clear  without rales or rhonchi  ?Cardiovascular: Rhythm regular, heart sounds  normal, no murmurs or gallops, no peripheral edema ?Musculoskeletal: No deformities, no cyanosis or clubbing  ? ? ? ?   ?Assessment & Plan:  ? ? ?

## 2021-11-29 NOTE — Assessment & Plan Note (Signed)
Unchanged since 2019 and probably benign. ?We will obtain 1 year follow-up CT chest without contrast ?

## 2021-11-29 NOTE — Patient Instructions (Signed)
?  X Refills on Anoro ? ?X Schedule LD CT chest screening ? ?Take claritin or zyrtec during spring or fall ?

## 2021-11-29 NOTE — Assessment & Plan Note (Signed)
Refills were provided on Anoro. ?We will check spirometry next visit. ?Albuterol can be used as needed for rescue ?

## 2021-11-30 ENCOUNTER — Telehealth: Payer: Self-pay | Admitting: Pulmonary Disease

## 2021-11-30 NOTE — Telephone Encounter (Signed)
Called and spoke with patient. She was seen by RA yesterday for a follow up. She forgot to ask RA for a doctors note. She just needs the note to say that she was here for an appointment. She thought showing the AVS to her supervisor would be enough but they are still wanting a letter.  ? ?She would like for the letter to be loaded to MyChart so that she can print it from home.  ? ?RA, can you please advise? Thanks!  ?

## 2021-11-30 NOTE — Telephone Encounter (Signed)
Called and spoke with patient. She verbalized understanding. She would also like to have a signed copy mailed to her just in case they will not accept the unsigned version. Verified her address. Will place in the mail tomorrow.  ? ?Nothing further needed at time of call.  ?

## 2021-12-11 ENCOUNTER — Ambulatory Visit: Payer: 59 | Admitting: Pulmonary Disease

## 2022-02-01 ENCOUNTER — Other Ambulatory Visit: Payer: Self-pay | Admitting: Medical

## 2022-02-02 ENCOUNTER — Telehealth: Payer: Self-pay | Admitting: Pulmonary Disease

## 2022-02-02 MED ORDER — ALBUTEROL SULFATE HFA 108 (90 BASE) MCG/ACT IN AERS: INHALATION_SPRAY | RESPIRATORY_TRACT | 3 refills | Status: DC

## 2022-02-02 NOTE — Telephone Encounter (Signed)
Called and spoke with patient who is needing a refill on her rescue inhaler. She verified pharmacy. Nothing further needed at this time.

## 2022-05-05 ENCOUNTER — Other Ambulatory Visit: Payer: Self-pay | Admitting: Pulmonary Disease

## 2022-05-07 DIAGNOSIS — I6389 Other cerebral infarction: Secondary | ICD-10-CM | POA: Diagnosis not present

## 2022-05-08 ENCOUNTER — Telehealth: Payer: Self-pay

## 2022-05-08 NOTE — Telephone Encounter (Signed)
Transition Care Management Follow-up Telephone Call Date of discharge and from where: [facility] 05-07-22 Dx: HTN/CVA How have you been since you were released from the hospital? Feeling ok  Any questions or concerns? No  Items Reviewed: Did the pt receive and understand the discharge instructions provided? Yes  Medications obtained and verified? Yes  Other? No  Any new allergies since your discharge? No  Dietary orders reviewed? Yes Do you have support at home? Yes   Home Care and Equipment/Supplies: Were home health services ordered? no If so, what is the name of the agency? na  Has the agency set up a time to come to the patient's home? not applicable Were any new equipment or medical supplies ordered?  No What is the name of the medical supply agency? na Were you able to get the supplies/equipment? not applicable Do you have any questions related to the use of the equipment or supplies? No  Functional Questionnaire: (I = Independent and D = Dependent) ADLs: I  Bathing/Dressing- I  Meal Prep- I  Eating- I  Maintaining continence- I  Transferring/Ambulation- I  Managing Meds- I  Follow up appointments reviewed:  PCP Hospital f/u appt confirmed? Yes  Scheduled to see Seabron Spates  on 05-10-22  @ 1140am. Specialist [facility] f/u appt confirmed? No pt is aware to  make appt with Neurologist Are transportation arrangements needed?no If their condition worsens, is the pt aware to call PCP or go to the Emergency Dept.? Yes Was the patient provided with contact information for the PCP's office or ED? Yes Was to pt encouraged to call back with questions or concerns? Yes

## 2022-05-10 ENCOUNTER — Encounter: Payer: Self-pay | Admitting: Family Medicine

## 2022-05-10 ENCOUNTER — Ambulatory Visit: Payer: 59 | Admitting: Family Medicine

## 2022-05-10 VITALS — BP 130/84 | HR 84 | Temp 98.3°F | Resp 18 | Ht 64.0 in | Wt 125.4 lb

## 2022-05-10 DIAGNOSIS — I639 Cerebral infarction, unspecified: Secondary | ICD-10-CM

## 2022-05-10 DIAGNOSIS — I10 Essential (primary) hypertension: Secondary | ICD-10-CM | POA: Insufficient documentation

## 2022-05-10 DIAGNOSIS — J432 Centrilobular emphysema: Secondary | ICD-10-CM

## 2022-05-10 DIAGNOSIS — F17200 Nicotine dependence, unspecified, uncomplicated: Secondary | ICD-10-CM | POA: Diagnosis not present

## 2022-05-10 DIAGNOSIS — Z720 Tobacco use: Secondary | ICD-10-CM | POA: Diagnosis not present

## 2022-05-10 HISTORY — DX: Essential (primary) hypertension: I10

## 2022-05-10 LAB — COMPREHENSIVE METABOLIC PANEL
ALT: 13 U/L (ref 0–35)
AST: 11 U/L (ref 0–37)
Albumin: 4.1 g/dL (ref 3.5–5.2)
Alkaline Phosphatase: 71 U/L (ref 39–117)
BUN: 9 mg/dL (ref 6–23)
CO2: 29 mEq/L (ref 19–32)
Calcium: 9.3 mg/dL (ref 8.4–10.5)
Chloride: 100 mEq/L (ref 96–112)
Creatinine, Ser: 0.64 mg/dL (ref 0.40–1.20)
GFR: 94.3 mL/min (ref >60.00)
Glucose, Bld: 133 mg/dL — ABNORMAL HIGH (ref 70–99)
Potassium: 3.7 mEq/L (ref 3.5–5.1)
Sodium: 137 mEq/L (ref 135–145)
Total Bilirubin: 0.4 mg/dL (ref 0.2–1.2)
Total Protein: 6.8 g/dL (ref 6.0–8.3)

## 2022-05-10 LAB — CBC WITH DIFFERENTIAL/PLATELET
Basophils Absolute: 0 10*3/uL (ref 0.0–0.1)
Basophils Relative: 0.8 % (ref 0.0–3.0)
Eosinophils Absolute: 0.1 10*3/uL (ref 0.0–0.7)
Eosinophils Relative: 2.1 % (ref 0.0–5.0)
HCT: 41.9 % (ref 36.0–46.0)
Hemoglobin: 14.1 g/dL (ref 12.0–15.0)
Lymphocytes Relative: 24.8 % (ref 12.0–46.0)
Lymphs Abs: 1.1 10*3/uL (ref 0.7–4.0)
MCHC: 33.6 g/dL (ref 30.0–36.0)
MCV: 101.3 fl — ABNORMAL HIGH (ref 78.0–100.0)
Monocytes Absolute: 0.6 10*3/uL (ref 0.1–1.0)
Monocytes Relative: 14 % — ABNORMAL HIGH (ref 3.0–12.0)
Neutro Abs: 2.7 10*3/uL (ref 1.4–7.7)
Neutrophils Relative %: 58.3 % (ref 43.0–77.0)
Platelets: 200 10*3/uL (ref 150.0–400.0)
RBC: 4.13 Mil/uL (ref 3.87–5.11)
RDW: 13.3 % (ref 11.5–15.5)
WBC: 4.6 10*3/uL (ref 4.0–10.5)

## 2022-05-10 LAB — LIPID PANEL
Cholesterol: 189 mg/dL (ref 0–200)
HDL: 88.6 mg/dL (ref >39.00)
LDL Cholesterol: 83 mg/dL (ref 0–99)
NonHDL: 100.1
Total CHOL/HDL Ratio: 2
Triglycerides: 88 mg/dL (ref 0.0–149.0)
VLDL: 17.6 mg/dL (ref 0.0–40.0)

## 2022-05-10 NOTE — Assessment & Plan Note (Signed)
con't aspirin and plavix  Check labs today

## 2022-05-10 NOTE — Assessment & Plan Note (Signed)
Per pulm Pt is working on quitting smoking

## 2022-05-10 NOTE — Patient Instructions (Signed)
Stroke Prevention Some medical conditions and behaviors can lead to a higher chance of having a stroke. You can help prevent a stroke by eating healthy, exercising, not smoking, and managing any medical conditions you have. Stroke is a leading cause of functional impairment. Primary prevention is particularly important because a majority of strokes are first-time events. Stroke changes the lives of not only those who experience a stroke but also their family and other caregivers. How can this condition affect me? A stroke is a medical emergency and should be treated right away. A stroke can lead to brain damage and can sometimes be life-threatening. If a person gets medical treatment right away, there is a better chance of surviving and recovering from a stroke. What can increase my risk? The following medical conditions may increase your risk of a stroke: Cardiovascular disease. High blood pressure (hypertension). Diabetes. High cholesterol. Sickle cell disease. Blood clotting disorders (hypercoagulable state). Obesity. Sleep disorders (obstructive sleep apnea). Other risk factors include: Being older than age 60. Having a history of blood clots, stroke, or mini-stroke (transient ischemic attack, TIA). Genetic factors, such as race, ethnicity, or a family history of stroke. Smoking cigarettes or using other tobacco products. Taking birth control pills, especially if you also use tobacco. Heavy use of alcohol or drugs, especially cocaine and methamphetamine. Physical inactivity. What actions can I take to prevent this? Manage your health conditions High cholesterol levels. Eating a healthy diet is important for preventing high cholesterol. If cholesterol cannot be managed through diet alone, you may need to take medicines. Take any prescribed medicines to control your cholesterol as told by your health care provider. Hypertension. To reduce your risk of stroke, try to keep your blood  pressure below 130/80. Eating a healthy diet and exercising regularly are important for controlling blood pressure. If these steps are not enough to manage your blood pressure, you may need to take medicines. Take any prescribed medicines to control hypertension as told by your health care provider. Ask your health care provider if you should monitor your blood pressure at home. Have your blood pressure checked every year, even if your blood pressure is normal. Blood pressure increases with age and some medical conditions. Diabetes. Eating a healthy diet and exercising regularly are important parts of managing your blood sugar (glucose). If your blood sugar cannot be managed through diet and exercise, you may need to take medicines. Take any prescribed medicines to control your diabetes as told by your health care provider. Get evaluated for obstructive sleep apnea. Talk to your health care provider about getting a sleep evaluation if you snore a lot or have excessive sleepiness. Make sure that any other medical conditions you have, such as atrial fibrillation or atherosclerosis, are managed. Nutrition Follow instructions from your health care provider about what to eat or drink to help manage your health condition. These instructions may include: Reducing your daily calorie intake. Limiting how much salt (sodium) you use to 1,500 milligrams (mg) each day. Using only healthy fats for cooking, such as olive oil, canola oil, or sunflower oil. Eating healthy foods. You can do this by: Choosing foods that are high in fiber, such as whole grains, and fresh fruits and vegetables. Eating at least 5 servings of fruits and vegetables a day. Try to fill one-half of your plate with fruits and vegetables at each meal. Choosing lean protein foods, such as lean cuts of meat, poultry without skin, fish, tofu, beans, and nuts. Eating low-fat dairy products. Avoiding   foods that are high in sodium. This can help  lower blood pressure. Avoiding foods that have saturated fat, trans fat, and cholesterol. This can help prevent high cholesterol. Avoiding processed and prepared foods. Counting your daily carbohydrate intake.  Lifestyle If you drink alcohol: Limit how much you have to: 0-1 drink a day for women who are not pregnant. 0-2 drinks a day for men. Know how much alcohol is in your drink. In the [place] one drink equals one 12 oz bottle of beer (355mL), one 5 oz glass of wine (148mL), or one 1 oz glass of hard liquor (44mL). Do not use any products that contain nicotine or tobacco. These products include cigarettes, chewing tobacco, and vaping devices, such as e-cigarettes. If you need help quitting, ask your health care provider. Avoid secondhand smoke. Do not use drugs. Activity  Try to stay at a healthy weight. Get at least 30 minutes of exercise on most days, such as: Fast walking. Biking. Swimming. Medicines Take over-the-counter and prescription medicines only as told by your health care provider. Aspirin or blood thinners (antiplatelets or anticoagulants) may be recommended to reduce your risk of forming blood clots that can lead to stroke. Avoid taking birth control pills. Talk to your health care provider about the risks of taking birth control pills if: You are over 35 years old. You smoke. You get very bad headaches. You have had a blood clot. Where to find more information American Stroke Association: www.strokeassociation.org Get help right away if: You or a loved one has any symptoms of a stroke. "BE FAST" is an easy way to remember the main warning signs of a stroke: B - Balance. Signs are dizziness, sudden trouble walking, or loss of balance. E - Eyes. Signs are trouble seeing or a sudden change in vision. F - Face. Signs are sudden weakness or numbness of the face, or the face or eyelid drooping on one side. A - Arms. Signs are weakness or numbness in an arm. This happens  suddenly and usually on one side of the body. S - Speech. Signs are sudden trouble speaking, slurred speech, or trouble understanding what people say. T - Time. Time to call emergency services. Write down what time symptoms started. You or a loved one has other signs of a stroke, such as: A sudden, severe headache with no known cause. Nausea or vomiting. Seizure. These symptoms may represent a serious problem that is an emergency. Do not wait to see if the symptoms will go away. Get medical help right away. Call your local emergency services (911 in the [place] Do not drive yourself to the hospital. Summary You can help to prevent a stroke by eating healthy, exercising, not smoking, limiting alcohol intake, and managing any medical conditions you may have. Do not use any products that contain nicotine or tobacco. These include cigarettes, chewing tobacco, and vaping devices, such as e-cigarettes. If you need help quitting, ask your health care provider. Remember "BE FAST" for warning signs of a stroke. Get help right away if you or a loved one has any of these signs. This information is not intended to replace advice given to you by your health care provider. Make sure you discuss any questions you have with your health care provider. Document Revised: 03/28/2020 Document Reviewed: 03/28/2020 Elsevier Patient Education  2023 [employer]  

## 2022-05-10 NOTE — Progress Notes (Signed)
Established Patient Office Visit  Subjective   Patient ID: Erin Sloan, female    DOB: 05/01/1961  Age: 61 y.o. MRN: [id number]  Chief Complaint  Patient presents with   Hospitalization Follow-up    CVA/ HTN    HPI Pt is here for hosp f/u for cva.  She was at work sat and her ears were sore and ringing and then she had weakness and numbness on R side --- she woke up the next day feeling worse .    She still needs f/u with neuro.   Patient Active Problem List   Diagnosis Date Noted   Primary hypertension 05/10/2022   CVA (cerebrovascular accident) (HCC) 11/18/2019   Pulmonary nodules 10/10/2018   Allergic reaction 08/20/2018   Alcohol use 08/20/2018   COPD (chronic obstructive pulmonary disease) (HCC) 08/20/2018   Acute allergic reaction    Tobacco use 08/19/2018   Acute bronchitis 08/19/2018   Hypokalemia 08/19/2018   WRIST PAIN, BILATERAL 02/14/2010   DYSURIA 02/14/2010   SINUSITIS - ACUTE-NOS 12/12/2009   Past Medical History:  Diagnosis Date   COPD (chronic obstructive pulmonary disease) (HCC)    Depression    Head ache    Past Surgical History:  Procedure Laterality Date   CARPAL TUNNEL RELEASE     CESAREAN SECTION     1985   CESAREAN SECTION     1987   Social History   Tobacco Use   Smoking status: Every Day    Packs/day: 1.50    Years: 48.00    Total pack years: 72.00    Types: Cigarettes   Smokeless tobacco: Never   Tobacco comments:    half pack daily-AH/11/14/2020  Substance Use Topics   Alcohol use: Yes    Comment: socially. moderate now.  years ago drank heavier on weekends.   Drug use: No   Social History   Socioeconomic History   Marital status: Widowed    Spouse name: Not on file   Number of children: Not on file   Years of education: Not on file   Highest education level: Not on file  Occupational History   Not on file  Tobacco Use   Smoking status: Every Day    Packs/day: 1.50    Years:  48.00    Total pack years: 72.00    Types: Cigarettes   Smokeless tobacco: Never   Tobacco comments:    half pack daily-AH/11/14/2020  Substance and Sexual Activity   Alcohol use: Yes    Comment: socially. moderate now.  years ago drank heavier on weekends.   Drug use: No   Sexual activity: Not Currently  Other Topics Concern   Not on file  Social History Narrative   Not on file   Social Determinants of Health   Financial Resource Strain: Not on file  Food Insecurity: Not on file  Transportation Needs: Not on file  Physical Activity: Not on file  Stress: Not on file  Social Connections: Not on file  Intimate Partner Violence: Not on file   Family Status  Relation Name Status   Mother  (Not Specified)   Father  (Not Specified)   Family History  Problem Relation Age of Onset   Pancreatic cancer Mother    Heart disease Father    Heart attack Father    Allergies  Allergen Reactions   Ibuprofen Swelling   Amoxicillin Hives  Has patient had a PCN reaction causing immediate rash, facial/tongue/throat swelling, SOB or lightheadedness with hypotension: No Has patient had a PCN reaction causing severe rash involving mucus membranes or skin necrosis: No Has patient had a PCN reaction that required hospitalization: No Has patient had a PCN reaction occurring within the last 10 years: No If all of the above answers are "NO", then may proceed with Cephalosporin use.\      Review of Systems  Constitutional:  Negative for fever and malaise/fatigue.  HENT:  Negative for congestion.   Eyes:  Negative for blurred vision.  Respiratory:  Negative for shortness of breath.   Cardiovascular:  Negative for chest pain, palpitations and leg swelling.  Gastrointestinal:  Negative for abdominal pain, blood in stool and nausea.  Genitourinary:  Negative for dysuria and frequency.  Musculoskeletal:  Negative for falls.  Skin:  Negative for rash.  Neurological:  Negative for dizziness,  loss of consciousness and headaches.  Endo/Heme/Allergies:  Negative for environmental allergies.  Psychiatric/Behavioral:  Negative for depression. The patient is not nervous/anxious.       Objective:     BP 130/84 (BP Location: Left Arm, Patient Position: Sitting, Cuff Size: Normal)   Pulse 84   Temp 98.3 F (36.8 C) (Oral)   Resp 18   Ht 5\' 4"  (1.626 m)   Wt 125 lb 6.4 oz (56.9 kg)   SpO2 98%   BMI 21.52 kg/m  BP Readings from Last 3 Encounters:  05/10/22 130/84  11/29/21 124/68  06/19/21 132/80   Wt Readings from Last 3 Encounters:  05/10/22 125 lb 6.4 oz (56.9 kg)  11/29/21 126 lb 12.8 oz (57.5 kg)  06/19/21 130 lb 9.6 oz (59.2 kg)   SpO2 Readings from Last 3 Encounters:  05/10/22 98%  11/29/21 100%  06/19/21 95%      Physical Exam Vitals and nursing note reviewed.  Constitutional:      Appearance: She is well-developed.  HENT:     Head: Normocephalic and atraumatic.  Eyes:     Conjunctiva/sclera: Conjunctivae normal.  Neck:     Thyroid: No thyromegaly.     Vascular: No carotid bruit or JVD.  Cardiovascular:     Rate and Rhythm: Normal rate and regular rhythm.     Heart sounds: Normal heart sounds. No murmur heard. Pulmonary:     Effort: Pulmonary effort is normal. No respiratory distress.     Breath sounds: Normal breath sounds. No wheezing or rales.  Chest:     Chest wall: No tenderness.  Musculoskeletal:     Cervical back: Normal range of motion and neck supple.  Neurological:     Mental Status: She is alert and oriented to person, place, and time.     Sensory: Sensory deficit present.     Comments: Numbness R arm/ hand  and R thigh     No results found for any visits on 05/10/22.  Last CBC Lab Results  Component Value Date   WBC 3.9 06/24/2020   HGB 13.2 06/24/2020   HCT 38.7 06/24/2020   MCV 101.3 (H) 06/24/2020   MCH 34.6 (H) 06/24/2020   RDW 12.6 06/24/2020   PLT 178 06/24/2020   Last metabolic panel Lab Results  Component  Value Date   GLUCOSE 81 06/24/2020   NA 139 06/24/2020   K 4.4 06/24/2020   CL 104 06/24/2020   CO2 27 06/24/2020   BUN 10 06/24/2020   CREATININE 0.52 06/24/2020   GFRNONAA >60 11/18/2019  CALCIUM 9.3 06/24/2020   PROT 6.4 06/24/2020   ALBUMIN 3.9 02/23/2020   BILITOT 0.6 06/24/2020   ALKPHOS 83 02/23/2020   AST 42 (H) 06/24/2020   ALT 63 (H) 06/24/2020   ANIONGAP 10 11/18/2019   Last lipids Lab Results  Component Value Date   CHOL 184 11/19/2019   HDL 82 11/19/2019   LDLCALC 89 11/19/2019   TRIG 63 11/19/2019   CHOLHDL 2.2 11/19/2019   Last hemoglobin A1c Lab Results  Component Value Date   HGBA1C 5.5 11/19/2019   Last thyroid functions Lab Results  Component Value Date   TSH 1.20 02/23/2020   Last vitamin D Lab Results  Component Value Date   VD25OH 15.20 (L) 02/23/2020   Last vitamin B12 and Folate Lab Results  Component Value Date   VITAMINB12 305 02/23/2020      The ASCVD Risk score (Arnett DK, et al., 2019) failed to calculate for the following reasons:   The patient has a prior MI or stroke diagnosis    Assessment & Plan:   Problem List Items Addressed This Visit       Unprioritized   Tobacco use    Pt is down to 1/2 ppd She con't to try to cut down       Primary hypertension    Well controlled, no changes to meds. Encouraged heart healthy diet such as the DASH diet and exercise as tolerated.       CVA (cerebrovascular accident) (HCC) - Primary    con't aspirin and plavix  Check labs today       Relevant Orders   Ambulatory referral to Neurology   Ambulatory referral to Physical Therapy   CBC with Differential/Platelet   Comprehensive metabolic panel   Lipid panel   COPD (chronic obstructive pulmonary disease) (HCC)    Per pulm Pt is working on quitting smoking       Other Visit Diagnoses     Smoker           Return in about 3 months (around 08/09/2022).    [name], DO

## 2022-05-10 NOTE — Assessment & Plan Note (Signed)
Pt is down to 1/2 ppd She con't to try to cut down

## 2022-05-10 NOTE — Assessment & Plan Note (Signed)
Well controlled, no changes to meds. Encouraged heart healthy diet such as the DASH diet and exercise as tolerated.  °

## 2022-05-17 ENCOUNTER — Ambulatory Visit: Payer: 59 | Attending: Family Medicine | Admitting: Physical Therapy

## 2022-05-17 ENCOUNTER — Telehealth: Payer: Self-pay

## 2022-05-17 VITALS — BP 165/86 | HR 70

## 2022-05-17 DIAGNOSIS — R2681 Unsteadiness on feet: Secondary | ICD-10-CM | POA: Diagnosis present

## 2022-05-17 DIAGNOSIS — I639 Cerebral infarction, unspecified: Secondary | ICD-10-CM | POA: Insufficient documentation

## 2022-05-17 DIAGNOSIS — R2689 Other abnormalities of gait and mobility: Secondary | ICD-10-CM | POA: Insufficient documentation

## 2022-05-17 NOTE — Therapy (Signed)
OUTPATIENT PHYSICAL THERAPY NEURO EVALUATION   Patient Name: Erin Sloan MRN: [id number] DOB:1961/02/07, 61 y.o., female Today's Date: 05/17/2022   PCP: [name], PA-C REFERRING PROVIDER: Zola Button, Grayling Congress, DO    PT End of Session - 05/17/22 0848     Visit Number 1    Number of Visits 1    Authorization Type [employer]    PT Start Time [id number]    PT Stop Time 0930    PT Time Calculation (min) 44 min    Equipment Utilized During Treatment Gait belt    Activity Tolerance Patient tolerated treatment well    Behavior During Therapy WFL for tasks assessed/performed             Past Medical History:  Diagnosis Date   COPD (chronic obstructive pulmonary disease) (HCC)    Depression    Head ache    Past Surgical History:  Procedure Laterality Date   CARPAL TUNNEL RELEASE     CESAREAN SECTION     1985   CESAREAN SECTION     1987   Patient Active Problem List   Diagnosis Date Noted   Primary hypertension 05/10/2022   CVA (cerebrovascular accident) (HCC) 11/18/2019   Pulmonary nodules 10/10/2018   Allergic reaction 08/20/2018   Alcohol use 08/20/2018   COPD (chronic obstructive pulmonary disease) (HCC) 08/20/2018   Acute allergic reaction    Tobacco use 08/19/2018   Acute bronchitis 08/19/2018   Hypokalemia 08/19/2018   WRIST PAIN, BILATERAL 02/14/2010   DYSURIA 02/14/2010   SINUSITIS - ACUTE-NOS 12/12/2009    ONSET DATE: 05/10/2022   REFERRING DIAG: I63.9 (ICD-10-CM) - Cerebrovascular accident (CVA), unspecified mechanism (HCC)   THERAPY DIAG:  Other abnormalities of gait and mobility  Unsteadiness on feet  Rationale for Evaluation and Treatment Rehabilitation  SUBJECTIVE:                                                                                                                                                                                              SUBJECTIVE STATEMENT: Pt reports this is her 2nd stroke and she is still experiencing  residual numbness on her R side. Pt very frustrated by ongoing numbness. Pt also reports she is expected to return to work next Monday (9/18) but is scared to drive. Pt reports for her job duties she is required to lift 50+ lbs and use fine motor control of her R hand. Pt also reports she is currently only taking her BP meds at night because the side effects make her feel weird/loopy.  Pt accompanied by: self and family member(daughter in lobby)  PERTINENT  HISTORY: COPD (chronic obstructive pulmonary disease), depression, headache  PAIN:  Are you having pain? No  PRECAUTIONS: Fall  WEIGHT BEARING RESTRICTIONS No  FALLS: Has patient fallen in last 6 months? No  LIVING ENVIRONMENT: Lives with: lives with their family (daughter) Lives in: House/apartment Stairs: Yes: External: 6 steps; can reach both Has following equipment at home: Grab bars  PLOF: Independent  PATIENT GOALS "get rid of this numbness"  OBJECTIVE:   DIAGNOSTIC FINDINGS: unable to access medical records from hospital stay at [facility]  COGNITION: Overall cognitive status: Within functional limits for tasks assessed   SENSATION: Numbness in entire R side of body (able to feel but "dull" compared to L side)  COORDINATION: Slightly dysmetric and slowed with RUE>LUE Slightly slowed on RLE vs LLE  EDEMA:  None present   POSTURE: rounded shoulders and forward head   LOWER EXTREMITY MMT:    MMT Right Eval Left Eval  Hip flexion 5 5  Hip extension    Hip abduction    Hip adduction    Hip internal rotation    Hip external rotation    Knee flexion 5 5  Knee extension 5 5  Ankle dorsiflexion 5 5  Ankle plantarflexion    Ankle inversion    Ankle eversion    (Blank rows = not tested)  BED MOBILITY:  Independent per pt report  TRANSFERS: Assistive device utilized: None  Sit to stand: Modified independence Stand to sit: Modified independence Chair to chair: Modified independence Floor:  not  assessed at eval  *mod I due to increased time needed to complete   STAIRS:  Level of Assistance: Modified independence  Stair Negotiation Technique: Step to Pattern Alternating Pattern  Forwards with Bilateral Rails  Number of Stairs: 4   Height of Stairs: 6  Comments: alternating pattern ascending, step-to pattern descending  GAIT: Gait pattern: WFL Distance walked: various clinic distances Assistive device utilized: None Level of assistance: Modified independence (increased time) Comments: WFL, pt reports slowing down her speed due to R toe catching occasionally due to residual numbness in R side  FUNCTIONAL TESTs:    [facility] PT Assessment - 05/17/22 0908       Ambulation/Gait   Gait velocity 32.8 ft over 13.47 sec = 2.44 ft/sec      Standardized Balance Assessment   Standardized Balance Assessment Timed Up and Go Test;Five Times Sit to Stand    Five times sit to stand comments  15.65 sec   BUE support on arms of chair     Timed Up and Go Test   TUG Normal TUG    Normal TUG (seconds) 11.31   no AD     Functional Gait  Assessment   Gait assessed  Yes    Gait Level Surface Walks 20 ft in less than 7 sec but greater than 5.5 sec, uses assistive device, slower speed, mild gait deviations, or deviates 6-10 in outside of the 12 in walkway width.    Change in Gait Speed Able to smoothly change walking speed without loss of balance or gait deviation. Deviate no more than 6 in outside of the 12 in walkway width.    Gait with Horizontal Head Turns Performs head turns smoothly with slight change in gait velocity (eg, minor disruption to smooth gait path), deviates 6-10 in outside 12 in walkway width, or uses an assistive device.    Gait with Vertical Head Turns Performs task with slight change in gait velocity (eg, minor disruption to smooth  gait path), deviates 6 - 10 in outside 12 in walkway width or uses assistive device    Gait and Pivot Turn Pivot turns safely in greater than 3  sec and stops with no loss of balance, or pivot turns safely within 3 sec and stops with mild imbalance, requires small steps to catch balance.    Step Over Obstacle Is able to step over one shoe box (4.5 in total height) without changing gait speed. No evidence of imbalance.    Gait with Narrow Base of Support Ambulates less than 4 steps heel to toe or cannot perform without assistance.    Gait with Eyes Closed Walks 20 ft, slow speed, abnormal gait pattern, evidence for imbalance, deviates 10-15 in outside 12 in walkway width. Requires more than 9 sec to ambulate 20 ft.    Ambulating Backwards Walks 20 ft, slow speed, abnormal gait pattern, evidence for imbalance, deviates 10-15 in outside 12 in walkway width.    Steps Two feet to a stair, must use rail.    Total Score 16    FGA comment: 16/30            Vitals:   05/17/22 0914  BP: (!) 165/86  Pulse: 70     TODAY'S TREATMENT:  PT Evaluation   PATIENT EDUCATION: Education details: Eval findings, POC, CVA edu Person educated: Patient Education method: Explanation and Handouts Education comprehension: verbalized understanding   HOME EXERCISE PROGRAM: N/A   ASSESSMENT:  CLINICAL IMPRESSION: Patient is a 61 year old female referred to Neuro OPPT for CVA with residual R-hemibody numbness.   Pt's PMH is significant for: COPD (chronic obstructive pulmonary disease), depression, and headache. The following deficits were present during the exam: decreased gait speed, impaired balance, impaired sensation, and increased fall risk. Based on her score on the FGA and her decreased gait speed, pt is an increased risk for falls. Pt would benefit from skilled PT to address these impairments and functional limitations to maximize functional mobility independence. However, due to the financial burden of physical therapy services and feeling comfortable with her current level of function (aside from residual R-side numbness) pt has elected to  defer further sessions at this time.  Time also spent during this evaluation providing education for patient regarding BP management and stroke prevention. Provided handout for BP parameters and encouraged pt to follow up with her PCP regarding the side effects of her current medication and ask about changing medications so that she can take the medication appropriately to manage her BP. Reviewed other factors in BP management and stroke prevention including dietary changes, increasing exercise/activity level, smoking cessation, and that history of prior CVAs puts her more at risk for another stroke. Pt understanding of education. Educated pt that she can ask her PCP or neurologist for a referral to OT services if she experiences ongoing difficulty with R hand fine motor control.     CLINICAL DECISION MAKING: Stable/uncomplicated  EVALUATION COMPLEXITY: Low  PLAN: PT FREQUENCY: one time visit     Peter Congo, PT, DPT, CSRS 05/17/2022, 9:43 AM

## 2022-05-17 NOTE — Telephone Encounter (Signed)
Caller Name Marvelle Span Caller Phone Number [phone] Patient Name Erin Sloan Patient DOB March 13, 1961 Call Type Message Only Information Provided Reason for Call Request for General Office Information Initial Comment Caller states that she had a stroke and went to therapy. She has a dental appointment and needs a release from Dr. Zola Button so that she can get her teeth cleaned. She also has numbness on her right side. Decline triage. Disp. Time Disposition Final User 05/17/2022 12:19:59 PM General Information Provided Yes Brooke Pace Call Closed By: Brooke Pace Transaction Date/Time: 05/17/2022 12:16:48 PM (ET)

## 2022-05-18 NOTE — Telephone Encounter (Signed)
Pt states the cleaning is on Tuesday , and they just need a note stating she is okay after stroke and can get teeth cleaned.       Dr.Jones  Fax number [phone]

## 2022-05-21 NOTE — Telephone Encounter (Signed)
Letter faxed.

## 2022-05-22 ENCOUNTER — Telehealth: Payer: Self-pay | Admitting: Family Medicine

## 2022-05-22 NOTE — Telephone Encounter (Signed)
Pt states her right side is still numb and she stated Dr.Lowne said she could go back to work Monday but she is not sure if she should wait. 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 Please advise pt.

## 2022-05-23 NOTE — Telephone Encounter (Signed)
Pt called and an appt made

## 2022-05-24 ENCOUNTER — Ambulatory Visit: Payer: 59 | Admitting: Medical

## 2022-05-24 VITALS — BP 140/80 | HR 79 | Resp 18 | Ht 64.0 in | Wt 124.0 lb

## 2022-05-24 DIAGNOSIS — R200 Anesthesia of skin: Secondary | ICD-10-CM

## 2022-05-24 DIAGNOSIS — R269 Unspecified abnormalities of gait and mobility: Secondary | ICD-10-CM

## 2022-05-24 DIAGNOSIS — G629 Polyneuropathy, unspecified: Secondary | ICD-10-CM

## 2022-05-24 DIAGNOSIS — E569 Vitamin deficiency, unspecified: Secondary | ICD-10-CM

## 2022-05-24 DIAGNOSIS — F17210 Nicotine dependence, cigarettes, uncomplicated: Secondary | ICD-10-CM

## 2022-05-24 DIAGNOSIS — R202 Paresthesia of skin: Secondary | ICD-10-CM

## 2022-05-24 DIAGNOSIS — F17200 Nicotine dependence, unspecified, uncomplicated: Secondary | ICD-10-CM

## 2022-05-24 DIAGNOSIS — Z8673 Personal history of transient ischemic attack (TIA), and cerebral infarction without residual deficits: Secondary | ICD-10-CM

## 2022-05-24 DIAGNOSIS — R5383 Other fatigue: Secondary | ICD-10-CM

## 2022-05-24 DIAGNOSIS — I639 Cerebral infarction, unspecified: Secondary | ICD-10-CM

## 2022-05-24 LAB — T4, FREE: Free T4: 0.66 ng/dL (ref 0.60–1.60)

## 2022-05-24 LAB — VITAMIN B12: Vitamin B-12: 428 pg/mL (ref 211–911)

## 2022-05-24 LAB — TSH: TSH: 1.14 u[IU]/mL (ref 0.35–5.50)

## 2022-05-24 NOTE — Progress Notes (Signed)
Subjective:    Patient ID: Erin Sloan, female    DOB: 06-06-1961, 61 y.o.   MRN: [id number]  HPI  Pt in for follow up on stroke.   Pt had stroke. She states she still feels numbness to both her rt upper and lower extremity.    Pt works at [employer]. She has to package inserts. Pt concerned she may make mistakes at work. She is scheduled to work this coming Monday.   Pt describes no motor deficits but both rt and lower ext still numb/paresthesia type sensation. Pt states she has concern that might trip at work.   Pt seeing neurologist for follow up in November.   Last A/P "   Tobacco use      Pt is down to 1/2 ppd She con't to try to cut down         Primary hypertension      Well controlled, no changes to meds. Encouraged heart healthy diet such as the DASH diet and exercise as tolerated.         CVA (cerebrovascular accident) (HCC) - Primary      con't aspirin and plavix  Check labs today          Relevant Orders    Ambulatory referral to Neurology    Ambulatory referral to Physical Therapy    CBC with Differential/Platelet    Comprehensive metabolic panel    Lipid panel    COPD (chronic obstructive pulmonary disease) (HCC)      Per pulm Pt is working on quitting smoking         Now patient only smoking 3-5 cigarettes a day.  Pt supervisor told her she does not want pt to return to work if still numbness.   Review of Systems     Objective:   Physical Exam  General Mental Status- Alert. General Appearance- Not in acute distress.   Skin General: Color- Normal Color. Moisture- Normal Moisture.  Neck Carotid Arteries- Normal color. Moisture- Normal Moisture. No carotid bruits. No JVD.  Chest and Lung Exam Auscultation: Breath Sounds:-Normal.  Cardiovascular Auscultation:Rythm- Regular. Murmurs & Other Heart Sounds:Auscultation of the heart reveals- No Murmurs.  Abdomen Inspection:-Inspeection  Normal. Palpation/Percussion:Note:No mass. Palpation and Percussion of the abdomen reveal- Non Tender, Non Distended + BS, no rebound or guarding.    Neurologic Cranial Nerve exam:- CN III-XII intact(No nystagmus), symmetric smile. Drift Test:- No drift. Romberg Exam:- Negative.  Heal to Toe Gait exam:- off balance. Finger to Nose:- Normal/Intact Strength:- 5/5 equal and symmetric strength both upper and lower extremities.  Sensation- monofilament testing on both hands. She could feel on both sides but describes much less sensation on left hand.      Assessment & Plan:   Patient Instructions  History of CVA with no motor deficits to right upper and lower extremity.  However persistent tingling and numbness to both right upper and lower extremity.  Also on exam some heel-to-toe gait disturbance.  After a brief couple of steps almost tripped.  Blood pressure well controlled today.  Continue aspirin and Plavix.  Continue to cut down on number of cigarettes.  Good job on reduction to 3 to 5 cigarettes daily now.  Please continue to taper off.  Decided to go ahead and make referral to Occupational Therapy.  Keep appointment with neurologist in November.  Very likely that your symptoms are all related to the stroke.  Decided to go ahead and work-up some differential in the event of  other nerve symptom causes.  Ordered B12, B1 and thyroid studies.  On review vitamin D deficiency still got vitamin D level today.  Work note given today.  Please get that your supervisor.  If you need FMLA or short-term disability paperwork filled out please send that to our office.  I will follow-up in 1 month or sooner if needed.   [name], PA-C

## 2022-05-24 NOTE — Patient Instructions (Addendum)
History of CVA with no motor deficits to right upper and lower extremity.  However persistent tingling and numbness to both right upper and lower extremity.  Also on exam some heel-to-toe gait disturbance.  After a brief couple of steps almost tripped.  Blood pressure well controlled today.  Continue aspirin and Plavix.  Continue to cut down on number of cigarettes.  Good job on reduction to 3 to 5 cigarettes daily now.  Please continue to taper off.  Decided to go ahead and make referral to Occupational Therapy.  Keep appointment with neurologist in November.  Very likely that your symptoms are all related to the stroke.  Decided to go ahead and work-up some differential in the event of other nerve symptom causes.  Ordered B12, B1 and thyroid studies.  On review vitamin D deficiency still got vitamin D level today.  Work note given today.  Please get that your supervisor.  If you need FMLA or short-term disability paperwork filled out please send that to our office.  I will follow-up in 1 month or sooner if needed.

## 2022-05-28 LAB — VITAMIN B1: Vitamin B1 (Thiamine): 9 nmol/L (ref 8–30)

## 2022-05-29 LAB — VITAMIN D 1,25 DIHYDROXY
Vitamin D 1, 25 (OH)2 Total: 37 pg/mL (ref 18–72)
Vitamin D2 1, 25 (OH)2: <8 pg/mL
Vitamin D3 1, 25 (OH)2: 37 pg/mL

## 2022-05-30 ENCOUNTER — Telehealth: Payer: Self-pay | Admitting: Medical

## 2022-05-30 NOTE — Telephone Encounter (Signed)
FMLA paperwork received via efax

## 2022-05-30 NOTE — Telephone Encounter (Signed)
Patient called to let us know that we would be receiving FMLA paperwork from her job. Advised patient that a note would be sent and to check of she could get them to fax it to [place] again.

## 2022-05-30 NOTE — Telephone Encounter (Signed)
Placed in pcp basket

## 2022-06-04 NOTE — Telephone Encounter (Signed)
Pt states her job needs them before the 30th and is hoping they can get filled out as soon as possible.

## 2022-06-05 ENCOUNTER — Ambulatory Visit: Payer: 59 | Admitting: Occupational Therapy

## 2022-06-05 ENCOUNTER — Telehealth: Payer: Self-pay | Admitting: Medical

## 2022-06-05 MED ORDER — ATORVASTATIN CALCIUM 40 MG PO TABS: 40.0000 mg | ORAL_TABLET | Freq: Every day | ORAL | 3 refills | Status: DC

## 2022-06-05 MED ORDER — METOPROLOL SUCCINATE ER 25 MG PO TB24: 25.0000 mg | ORAL_TABLET | Freq: Every day | ORAL | 3 refills | Status: DC

## 2022-06-05 MED ORDER — CLOPIDOGREL BISULFATE 75 MG PO TABS: 75.0000 mg | ORAL_TABLET | Freq: Every day | ORAL | 3 refills | Status: DC

## 2022-06-05 NOTE — Addendum Note (Signed)
Addended by: Anabel Halon on: 06/05/2022 06:13 PM   Modules accepted: Orders

## 2022-06-05 NOTE — Telephone Encounter (Signed)
Pt is on her last day of pills from the hospital and needs a refill as soon as possible. She is worried as she just had her second stroke. She is also asking for a status update on fmla papers.   Medication:  atorvastatin (LIPITOR) 40 MG tablet  clopidogrel (PLAVIX) 75 MG tablet  metoprolol succinate (TOPROL-XL) 25 MG 24 hr tablet  Has the patient contacted their pharmacy? No.   Preferred Pharmacy:  [facility] [address] [place], [place] [address]

## 2022-06-06 NOTE — Telephone Encounter (Signed)
Patient called to follow up on status of forms. Advised that her provider is working with his team to see what can be done.

## 2022-06-06 NOTE — Telephone Encounter (Signed)
Spoke with Lorenda Peck at [facility] office and made her aware provider is out of the office and she stated she will put a note in patients chart that [place] will be a week late   Spoke with Talana made her aware I spoke with the [facility] office and told them about the delay    Leave number: [phone]

## 2022-06-06 NOTE — Telephone Encounter (Signed)
Refer to other phone note . 

## 2022-06-10 ENCOUNTER — Telehealth: Payer: Self-pay | Admitting: Medical

## 2022-06-10 NOTE — Telephone Encounter (Deleted)
I just got form/got back in office on Jun 11 2022. Delay in him getting us the form. Will you looks at old copies and print. Also best for him to have office visit or video visit to a lot me time to fill out form this week. 

## 2022-06-10 NOTE — Telephone Encounter (Signed)
I accidentally sent you note on this pt. Was referring to other pt. Want to discuss with you.

## 2022-06-10 NOTE — Telephone Encounter (Signed)
Placed note in wrong chart.

## 2022-06-11 ENCOUNTER — Ambulatory Visit: Payer: 59 | Attending: Medical | Admitting: Occupational Therapy

## 2022-06-11 ENCOUNTER — Encounter: Payer: Self-pay | Admitting: Occupational Therapy

## 2022-06-11 DIAGNOSIS — R200 Anesthesia of skin: Secondary | ICD-10-CM | POA: Diagnosis present

## 2022-06-11 DIAGNOSIS — R202 Paresthesia of skin: Secondary | ICD-10-CM | POA: Diagnosis present

## 2022-06-11 DIAGNOSIS — R278 Other lack of coordination: Secondary | ICD-10-CM | POA: Diagnosis present

## 2022-06-11 NOTE — Therapy (Signed)
OUTPATIENT OCCUPATIONAL THERAPY NEURO EVALUATION  Patient Name: Erin Sloan MRN: [id number] DOB:14-Jan-1961, 61 y.o., female Today's Date: 06/11/2022  PCP: Elise Benne REFERRING PROVIDER: Elise Benne   OT End of Session - 06/11/22 1105     Visit Number 1    Authorization Type [employer]    Authorization Time Period VL: 16    OT Start Time 1102    OT Stop Time 1145    OT Time Calculation (min) 43 min    Behavior During Therapy WFL for tasks assessed/performed            Past Medical History:  Diagnosis Date   COPD (chronic obstructive pulmonary disease) ([place])    Depression    Head ache    Past Surgical History:  Procedure Laterality Date   [place]   Patient Active Problem List   Diagnosis Date Noted   Primary hypertension 05/10/2022   CVA (cerebrovascular accident) ([place]) 11/18/2019   Pulmonary nodules 10/10/2018   Allergic reaction 08/20/2018   Alcohol use 08/20/2018   COPD (chronic obstructive pulmonary disease) ([place]) 08/20/2018   Acute allergic reaction    Tobacco use 08/19/2018   Acute bronchitis 08/19/2018   Hypokalemia 08/19/2018   WRIST PAIN, BILATERAL 02/14/2010   DYSURIA 02/14/2010   SINUSITIS - ACUTE-NOS 12/12/2009    ONSET DATE: 05/05/22  REFERRING DIAG:  R20.0,R20.2 (ICD-10-CM) - Numbness and tingling R20.2 (ICD-10-CM) - Paresthesia R26.9 (ICD-10-CM) - Gait disturbance   THERAPY DIAG:  Numbness and tingling  Other lack of coordination  Rationale for Evaluation and Treatment Rehabilitation  SUBJECTIVE:   SUBJECTIVE STATEMENT: Pt reports this is her 2nd stroke and she is still experiencing residual numbness on her R side. Pt very frustrated by ongoing numbness. Pt also reports she is expected to return to work next Monday (9/18) but is scared to drive; "I'm terrified to drive." Pt reports for her job duties she is required to lift 50+ lbs and use  fine motor control of her R hand. Pt also reports she is currently only taking her BP meds at night because the side effects make her feel weird/loopy. Pt accompanied by:  daughter (remained in lobby)  PERTINENT HISTORY: HTN/CVA in August 2023 w/ h/o previous CVA in 2021; PMH also includes COPD and tobacco use  PRECAUTIONS: Fall  WEIGHT BEARING RESTRICTIONS No  PAIN: Are you having pain? No  FALLS: Has patient fallen in last 6 months? No  LIVING ENVIRONMENT: Lives with: lives with their daughter Lives in: Mobile home Stairs:  5 steps to enter; rails on both sides Has following equipment at home: None  PLOF: Independent and Vocation/Vocational requirements: working full-time as a Comptroller  PATIENT GOALS: "get the numbness to go away"   OBJECTIVE:   HAND DOMINANCE: Right  ADLs: Overall ADLs: Mod Ind w/ BADLs, requires extended time; maintains hold on grab bar throughout showering and during tub/shower transfer Equipment: Grab bars  IADLs: Shopping: Accompanied on all shopping trips Housekeeping: Does dishes, requires assist w/ heavy IADLs Meal Prep: Daughter does the cooking; able to complete simple snack/meal prep Community mobility: Not driving at this time Medication management: Independent Handwriting: 100% legible, Increased time, and pt reports it is "sloppy"  MOBILITY STATUS:  Ambulated in/out of session w/out AD; no LOB but reports tripping  FUNCTIONAL OUTCOME MEASURES: Upper Extremity Functional Index: 31/80 Moderate difficulty:  lifting a bag of groceries to waist level, preparing food, dressing, doing up buttons, using tools/appliances, cleaning, carrying a small suitcase w/ affected UE Quite a bit of difficulty: usual work/housework, lifting a bag of groceries overhead, pushing up on your hands, vacuuming, sweeping, or raking, tying shoes, laundry, opening a jar, throwing a ball Extreme difficulty/unable: usual  hobbies/recreational activities, driving  UPPER EXTREMITY ROM    WFL for tasks assessed  UPPER EXTREMITY MMT:     MMT Right Eval - 10/2  Shoulder flexion 4/5  Shoulder abduction 4/5  Shoulder adduction 4/5  Shoulder extension 4/5  Shoulder internal rotation 4+/5  Shoulder external rotation 4/5  Elbow flexion 5/5  Elbow extension 5/5  (Blank rows = not tested)  HAND FUNCTION: Grip strength: Right: 19 lbs; Left: 36 lbs  COORDINATION: Finger Nose Finger test: 8 in 20 sec w/ RUE; mild dysmetria and ataxia observed, particularly w/ fatigue 9 Hole Peg test: Right: 41.91 sec; Left: 33.77 sec  SENSATION: RUE numbness; denies paresthesias  COGNITION: Overall cognitive status: Within functional limits for tasks assessed  VISION: Baseline vision: Wears glasses for reading only Visual history:  reports no significant history  VISION ASSESSMENT: WFL   TODAY'S TREATMENT:  Education provided on sensory reeducation strategies w/ handout provided Reviewed coordination activities for RUE w/ corresponding handout provided   PATIENT EDUCATION: Educated on role and purpose of OT as well as potential interventions and goals for therapy based on initial evaluation findings. Person educated: Patient Education method: Explanation Education comprehension: verbalized understanding   HOME EXERCISE PROGRAM: Coordination activities (printed handout provided)   GOALS: Goals reviewed with patient? No  SHORT TERM GOALS: Target date: 06/25/22    Status:  1 Pt will demonstrate independence w/ HEP designed for sensory reeducation, FM and GMC, and coordination  Baseline: to be administered Initial     LONG TERM GOALS: Target date: 07/25/22    Status:  1 Pt will improve GMC of RUE for improved participation in higher level IADLs as evidenced by increasing finger to nose test to at least 10 taps in 20 seconds Baseline: 8 in 20 sec Initial  2 Pt will improve efficiency during  functional fine motor tasks (e.g., buttons, ties/laces) by improving time to complete 9-HPT w/ RUE to 38 sec or better Baseline: Right: 41.91 sec; Left: 33.77 sec Initial  4 Pt will improve grip strength in R hand by 5 lbs for increased functional use in housekeeping and meal prep tasks Baseline: Right: 19 lbs; Left: 36 lbs Initial  5 Pt will report being able to complete laundry or other heavy housekeeping tasks (e.g., vacuuming) w/ moderate difficulty or better by discharge Baseline: quite a bit of difficulty, per UEFI Initial     ASSESSMENT:  CLINICAL IMPRESSION: Pt is a 61 y/o who presents to OP OT due to R-sided numbness and decreased strength s/p CVA in August 2023. PMH significant for COPD, depression, and h/o prior CVA in 2021. 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 Pt will benefit from skilled occupational therapy services to address strength and coordination, altered sensation and numbness, balance, GM/FM control, introduction of compensatory strategies/AE prn, and implementation of an HEP to improve participation and safety during ADLs, IADLs and facilitate safe return to work if able.   PERFORMANCE DEFICITS in functional skills including coordination, dexterity, proprioception, sensation, strength, FMC, balance, and  UE functional use.  IMPAIRMENTS are limiting patient from ADLs, IADLs, and work.   COMORBIDITIES may have co-morbidities  that affects occupational performance. Patient will benefit from skilled OT to address above impairments and improve overall function.  MODIFICATION OR ASSISTANCE TO COMPLETE EVALUATION: No modification of tasks or assist necessary to complete an evaluation.  OT OCCUPATIONAL PROFILE AND HISTORY: Problem focused assessment: Including review of records relating to presenting problem.  CLINICAL DECISION MAKING:  Moderate - several treatment options, min-mod task modification necessary  REHAB POTENTIAL: Good  EVALUATION COMPLEXITY: Moderate   PLAN: OT FREQUENCY: 1x/week  OT DURATION: 6 weeks (may require decreased frequency due to financial burden to pt)  PLANNED INTERVENTIONS: self care/ADL training, therapeutic exercise, therapeutic activity, neuromuscular re-education, manual therapy, functional mobility training, electrical stimulation, fluidotherapy, moist heat, cryotherapy, patient/family education, and DME and/or AE instructions  RECOMMENDED OTHER SERVICES: Pt was evaluated by PT 05/17/22, but was unable to proceed w/ services at that time  CONSULTED AND AGREED WITH PLAN OF CARE: Patient  PLAN FOR NEXT SESSION: Review sensory reeducation and coordination activities; address GMC and hand strengthening / update HEP   Rosie Fate, MSOT, OTR/L 06/11/2022, 2:20 PM

## 2022-06-14 NOTE — Telephone Encounter (Signed)
Pt called to follow up on FMLA progress. Spoke with Judson Roch. and she advised that it was being worked on and would let her know as soon as it's complete. Advised pt of the following and stated that it would be routed as high priority to get this sorted for her. Pt acknowledged understanding.

## 2022-06-18 ENCOUNTER — Ambulatory Visit (INDEPENDENT_AMBULATORY_CARE_PROVIDER_SITE_OTHER): Payer: 59 | Admitting: Medical

## 2022-06-18 ENCOUNTER — Telehealth: Payer: Self-pay | Admitting: Medical

## 2022-06-18 VITALS — BP 160/76 | HR 85 | Resp 18 | Ht 64.0 in | Wt 127.8 lb

## 2022-06-18 DIAGNOSIS — I69398 Other sequelae of cerebral infarction: Secondary | ICD-10-CM

## 2022-06-18 DIAGNOSIS — R200 Anesthesia of skin: Secondary | ICD-10-CM

## 2022-06-18 DIAGNOSIS — R269 Unspecified abnormalities of gait and mobility: Secondary | ICD-10-CM

## 2022-06-18 DIAGNOSIS — I639 Cerebral infarction, unspecified: Secondary | ICD-10-CM

## 2022-06-18 DIAGNOSIS — I10 Essential (primary) hypertension: Secondary | ICD-10-CM

## 2022-06-18 DIAGNOSIS — R202 Paresthesia of skin: Secondary | ICD-10-CM | POA: Diagnosis not present

## 2022-06-18 MED ORDER — LOSARTAN POTASSIUM 25 MG PO TABS: 25.0000 mg | ORAL_TABLET | Freq: Every day | ORAL | 3 refills | Status: DC

## 2022-06-18 NOTE — Addendum Note (Signed)
Addended by: Anabel Halon on: 06/18/2022 01:15 PM   Modules accepted: Orders

## 2022-06-18 NOTE — Patient Instructions (Addendum)
Cerebrovascular accident with some persistent gait disturbance, right upper/lower extremity paresthesias and on daily basis decreased  rt upper ext grip strength all doing activities of daily living.  Appears patient not able to work presently based on her described occupation.  Recommend continuing to follow-up with occupational therapy and ultimately follow through with specialist who can do simulated work testing.  Also follow-up with neurologist.  Filled out to extensive complicated FMLA forms today and they would do and were given to me prior to me being in of the office for 1 week.  She will need to go ahead and fill out presently/today.  BP elevated today.  Asked patient to check blood pressure at home and update me with readings.  If still above 140/90 will prescribe blood pressure medication. Later got bp reading back 139/77. Many recent sept readings high. So added loasratan 25 mg daily to med regimen.  Follow-up with myself November 22 or sooner if needed.

## 2022-06-18 NOTE — Telephone Encounter (Addendum)
Pt wanted to advise Jarrett Soho her bp results were 139/77.   Better if bp closer to 130/80 and various sept readings on very high side. With hx of 2 strokes in past decided to add on losartan 25 mg daily to med regimen.

## 2022-06-18 NOTE — Progress Notes (Signed)
Subjective:    Patient ID: Erin Sloan, female    DOB: 1961-02-19, 61 y.o.   MRN: [id number]  HPI  Pt in for follow up.  Pt daughter states she has troubled going up steps. Not coordinated. Also pt feels like not safe to drive at well due to fact her foot feels numb. But has motor function. Pt feels like when she did try to drive since can't assess how hard she is pressing on pedal.   She has also has difficulty opening up new bottle of juice. Can open up new mild.   When vaccums with rt arm will get tired easily.  She had stroke on May 06, 2022  and discharged next night.   She has not been working since.   Pt needs to be written of work. She has seen initial PT and OT. Will do OT paperwork and then have other specialist evaluation who will simulate her work duty. Her supervisor had expressed reluctance and concern she could not do her job. Patient expressed this as well.   Patient has not seen neurologist for follow-up as needed.  On review it looks like they did try to contact her but they want to see [facility] records prior to be scheduled.  Patient has seen go for neurology in the past for prior stroke in 2021.  Review of Systems  Constitutional:  Negative for chills, fatigue and fever.  Respiratory:  Negative for cough, chest tightness, shortness of breath and wheezing.   Cardiovascular:  Negative for chest pain and palpitations.  Gastrointestinal:  Negative for abdominal pain and nausea.  Musculoskeletal:  Negative for back pain.  Skin:  Negative for rash.  Neurological:        See hpi.  Hematological:  Negative for adenopathy. Does not bruise/bleed easily.  Psychiatric/Behavioral:  Negative for behavioral problems, confusion and decreased concentration.     Past Medical History:  Diagnosis Date   COPD (chronic obstructive pulmonary disease) ([place])    Depression    Head ache      Social History   Socioeconomic History   Marital status: Widowed     Spouse name: Not on file   Number of children: Not on file   Years of education: Not on file   Highest education level: Not on file  Occupational History   Not on file  Tobacco Use   Smoking status: Every Day    Packs/day: 1.50    Years: 48.00    Total pack years: 72.00    Types: Cigarettes   Smokeless tobacco: Never   Tobacco comments:    half pack daily-AH/11/14/2020  Substance and Sexual Activity   Alcohol use: Yes    Comment: socially. moderate now.  years ago drank heavier on weekends.   Drug use: No   Sexual activity: Not Currently  Other Topics Concern   Not on file  Social History Narrative   Not on file   Social Determinants of Health   Financial Resource Strain: Not on file  Food Insecurity: Not on file  Transportation Needs: Not on file  Physical Activity: Not on file  Stress: Not on file  Social Connections: Not on file  Intimate Partner Violence: Not on file    Past Surgical History:  Procedure Laterality Date   CARPAL [place]    Family History  Problem Relation Age of  Onset   Pancreatic cancer Mother    Heart disease Father    Heart attack Father     Allergies  Allergen Reactions   Ibuprofen Swelling   Amoxicillin Hives    Has patient had a PCN reaction causing immediate rash, facial/tongue/throat swelling, SOB or lightheadedness with hypotension: No Has patient had a PCN reaction causing severe rash involving mucus membranes or skin necrosis: No Has patient had a PCN reaction that required hospitalization: No Has patient had a PCN reaction occurring within the last 10 years: No If all of the above answers are "NO", then may proceed with Cephalosporin use.\    Current Outpatient Medications on File Prior to Visit  Medication Sig Dispense Refill   albuterol (VENTOLIN HFA) 108 (90 Base) MCG/ACT inhaler TAKE 2 PUFFS BY MOUTH EVERY 6 HOURS AS NEEDED FOR WHEEZE OR SHORTNESS OF  BREATH 8.5 each 3   aspirin 325 MG tablet Take 325 mg by mouth daily.     atorvastatin (LIPITOR) 40 MG tablet Take 1 tablet (40 mg total) by mouth daily. 90 tablet 3   clopidogrel (PLAVIX) 75 MG tablet Take 1 tablet (75 mg total) by mouth daily. 90 tablet 3   metoprolol succinate (TOPROL-XL) 25 MG 24 hr tablet Take 1 tablet (25 mg total) by mouth daily. 90 tablet 3   umeclidinium-vilanterol (ANORO ELLIPTA) 62.5-25 MCG/ACT AEPB INHALE 1 PUFF BY MOUTH EVERY DAY 60 each 11   No current facility-administered medications on file prior to visit.    BP (!) 160/76   Pulse 85   Resp 18   Ht 5\' 4"  (1.626 m)   Wt 127 lb 12.8 oz (58 kg)   SpO2 100%   BMI 21.94 kg/m        Objective:   Physical Exam  General Mental Status- Alert. General Appearance- Not in acute distress.    Skin General: Color- Normal Color. Moisture- Normal Moisture.   Neck Carotid Arteries- Normal color. Moisture- Normal Moisture. No carotid bruits. No JVD.   Chest and Lung Exam Auscultation: Breath Sounds:-Normal.   Cardiovascular Auscultation:Rythm- Regular. Murmurs & Other Heart Sounds:Auscultation of the heart reveals- No Murmurs.   Abdomen Inspection:-Inspeection Normal. Palpation/Percussion:Note:No mass. Palpation and Percussion of the abdomen reveal- Non Tender, Non Distended + BS, no rebound or guarding.       Neurologic(see 05-24-2022 more indepth exam) Cranial Nerve exam:- CN III-XII intact(No nystagmus), symmetric smile. Strength:- 5/5 equal and symmetric strength both upper and lower extremities.  Sensation- still having decreaeased senation on rt upper extremith.      Assessment & Plan:   Patient Instructions  Cerebrovascular accident with some persistent gait disturbance, right upper/lower extremity paresthesias and on daily basis decreased  rt upper ext grip strength all doing activities of daily living.  Appears patient not able to work presently based on her described occupation.   Recommend continuing to follow-up with occupational therapy and ultimately follow through with specialist who can do simulated work testing.  Also follow-up with neurologist.  Filled out to extensive complicated FMLA forms today and they would do and were given to me prior to me being in of the office for 1 week.  She will need to go ahead and fill out presently/today.  BP elevated today.  Asked patient to check blood pressure at home and update me with readings.  If still above 140/90 will prescribe blood pressure medication.  Follow-up with myself November 22 or sooner if needed.   Time spent with patient today was45  minutes which consisted of chart review, discussing diagnosis,  treatment , referral, filling out 2 copmlex fmla forms and documentation.

## 2022-06-19 NOTE — Telephone Encounter (Signed)
Pt called back and pt notified 

## 2022-06-19 NOTE — Telephone Encounter (Signed)
Pt called and lvm to return call 

## 2022-06-29 ENCOUNTER — Telehealth: Payer: Self-pay | Admitting: Medical

## 2022-06-29 DIAGNOSIS — Z0279 Encounter for issue of other medical certificate: Secondary | ICD-10-CM

## 2022-06-29 NOTE — Telephone Encounter (Signed)
Forms placed in provider basket 

## 2022-06-29 NOTE — Telephone Encounter (Signed)
Pt dropped off document to be filled out by provider ( Disability Claim form - 4 pages) Pt would like document to be faxed to [phone] when it is done and to call pt at [phone] when it is sent to fax. Document put at front office tray under providers name.

## 2022-07-02 ENCOUNTER — Encounter: Payer: Self-pay | Admitting: Occupational Therapy

## 2022-07-02 ENCOUNTER — Ambulatory Visit: Payer: 59 | Admitting: Occupational Therapy

## 2022-07-02 DIAGNOSIS — R200 Anesthesia of skin: Secondary | ICD-10-CM | POA: Diagnosis not present

## 2022-07-02 DIAGNOSIS — R278 Other lack of coordination: Secondary | ICD-10-CM

## 2022-07-02 NOTE — Therapy (Signed)
OUTPATIENT OCCUPATIONAL THERAPY TREATMENT NOTE  Patient Name: Erin Sloan MRN: [id number] DOB:1961/09/03, 61 y.o., female Today's Date: 07/02/2022  PCP: Elise Benne REFERRING PROVIDER: Elise Benne   OT End of Session - 07/02/22 0934     Visit Number 2    Number of Visits 5    Date for OT Re-Evaluation 07/25/22    Authorization Type [employer]    Authorization Time Period VL: 50    OT Start Time 0931    OT Stop Time 1021    OT Time Calculation (min) 50 min    Activity Tolerance Patient tolerated treatment well    Behavior During Therapy [facility] for tasks assessed/performed            Past Medical History:  Diagnosis Date   COPD (chronic obstructive pulmonary disease) ([place])    Depression    Head ache    Past Surgical History:  Procedure Laterality Date   [place]   Patient Active Problem List   Diagnosis Date Noted   Primary hypertension 05/10/2022   CVA (cerebrovascular accident) ([place]) 11/18/2019   Pulmonary nodules 10/10/2018   Allergic reaction 08/20/2018   Alcohol use 08/20/2018   COPD (chronic obstructive pulmonary disease) ([place]) 08/20/2018   Acute allergic reaction    Tobacco use 08/19/2018   Acute bronchitis 08/19/2018   Hypokalemia 08/19/2018   WRIST PAIN, BILATERAL 02/14/2010   DYSURIA 02/14/2010   SINUSITIS - ACUTE-NOS 12/12/2009    ONSET DATE: 05/05/22  REFERRING DIAG:  R20.0,R20.2 (ICD-10-CM) - Numbness and tingling R20.2 (ICD-10-CM) - Paresthesia R26.9 (ICD-10-CM) - Gait disturbance   THERAPY DIAG:  Numbness and tingling  Other lack of coordination  Rationale for Evaluation and Treatment Rehabilitation  SUBJECTIVE:   SUBJECTIVE STATEMENT: Pt reports R-sided numbness feels about the same. Also states she is still very nervous about driving, but thinks she would be able to do it if she needed to Pt accompanied by:  daughter (remained in  lobby)  PERTINENT HISTORY: HTN/CVA in August 2023 w/ h/o previous CVA in 2021; PMH also includes COPD and tobacco use  PRECAUTIONS: Fall  PAIN: Are you having pain? 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  PATIENT GOALS: "get the numbness to go away"   OBJECTIVE:  ------------------------------------------------------------------------------------------------------------------------------------------------------ [place] UNLESS DESIGNATED OTHERWISE  HAND DOMINANCE: Right  ADLs: Overall ADLs: Mod Ind w/ BADLs, requires extended time; maintains hold on grab bar throughout showering and during tub/shower transfer Equipment: Grab bars  IADLs: Shopping: Accompanied on all shopping trips Housekeeping: Does dishes, requires assist w/ heavy IADLs Meal Prep: Daughter does the cooking; able to complete simple snack/meal prep Community mobility: Not driving at this time Medication management: Independent Handwriting: 100% legible, Increased time, and pt reports it is "sloppy"  MOBILITY STATUS:  Ambulated in/out of session w/out AD; no LOB but reports tripping  FUNCTIONAL OUTCOME MEASURES: Upper Extremity Functional Index: 31/80 Moderate difficulty: lifting a bag of groceries to waist level, preparing food, dressing, doing up buttons, using tools/appliances, cleaning, carrying a small suitcase w/ affected UE Quite a bit of difficulty: usual work/housework, lifting a bag of groceries overhead, pushing up on your hands, vacuuming, sweeping, or raking, tying shoes, laundry, opening a jar, throwing a ball Extreme difficulty/unable: usual hobbies/recreational activities, driving  ------------------------------------------------------------------------------------------------------------------------------------------------------  TODAY'S TREATMENT:  ADLs/IADLs  Education provided regarding potential return to driving, including: emphasizing importance of receiving medical clearance from physician, graduated return to driving (see pt instructions), and education on formal driving evaluation in the future, if needed  Education provided related to energy conservation strategies (e.g., planning, pacing, prioritizing, and positioning); discussed pt-specific examples to facilitate understanding  Education provided on potential benefit of formal work evaluation, what is typically involved, and reaching out to MD to set this up  Putty Exercises Gross grasp of putty w/ R hand 25x  Thumb-to-finger opposition completed 5x along length of rolled putty w/ R hand; pt demo'd appropriate pad-to-pad prehension  Pinching and pulling off a small piece of putty, then rolling into a small ball for intrinsic hand/lumbrical strengthening completed 10x w/ R hand   NMR Gentle vibration w/ massager to RUE and hand to facilitate sensory reeducation; OT provided corresponding education, reviewing sensory reeducation strategies she is able to complete at home. Pt tolerated intervention w/ no discomfort.    PATIENT EDUCATION: Ongoing condition-specific education; see tx section above Person educated: Patient Education method: Explanation Education comprehension: verbalized understanding   HOME EXERCISE PROGRAM: Sensory reeducation (printed handout provided) Coordination activities (printed handout provided)  MedBridge Access Code: [email or web address] URL: [email or web address] - Seated Shoulder Flexion with Dowel to 90  - 1 x daily - 1 sets - 10-15 reps - Seated Shoulder Flexion with Self-Anchored Resistance  - 2 x daily - 2 sets - 10 reps - Seated Shoulder Extension with Resistance  - 2 x daily - 2 sets - 10 reps - Composite Flexion with Putty  - 2 x daily - 1 sets - 25 reps - Thumb Opposition with Putty  - 2 x daily - 4-6 sets - Tip Pinch with Putty  - 2 x daily -  10-15 reps   GOALS: Goals reviewed with patient? Yes  SHORT TERM GOALS: Target date: 06/25/22    Status:  1 Pt will demonstrate independence w/ HEP designed for sensory reeducation, FM and [place], and coordination  Baseline: to be administered Met - 07/02/22     LONG TERM GOALS: Target date: 07/25/22    Status:  1 Pt will improve GMC of RUE for improved participation in higher level IADLs as evidenced by increasing finger to nose test to at least 10 taps in 20 seconds Baseline: 8 in 20 sec Progressing  2 Pt will improve efficiency during functional fine motor tasks (e.g., buttons, ties/laces) by improving time to complete 9-HPT w/ RUE to 38 sec or better Baseline: Right: 43.46 sec; Left: 33.77 sec Progressing  4 Pt will improve grip strength in R hand by 5 lbs for increased functional use in housekeeping and meal prep tasks Baseline: Right: 19 lbs; Left: 36 lbs Met - 07/02/22: 28 lbs  5 Pt will report being able to complete laundry or other heavy housekeeping tasks (e.g., vacuuming) w/ moderate difficulty or better by discharge Baseline: quite a bit of difficulty, per UEFI Progressing     ASSESSMENT:  CLINICAL IMPRESSION: Pt arrives for first treatment session following initial evaluation on 06/11/22; pt seen once every couple of weeks due to financial considerations. OT reviewed goals w/ pt who is agreeable to plan of care at this time. Putty exercises introduced for hand strengthening and somatosensory input through R hand for hopeful continued sensory reeducation. OT also reviewed sensory reeducation strategies and incorporated gentle vibration into session, providing education to facilitate carryover to home; pt verbalized understanding. Per pt report, she had a follow-up w/ her PCP  and has been cleared to drive, but written out of work until at least December. OT discussed this w/ pt, providing options for further, more targeted, assessment if she or her following MD find this to  needed in the future. In OP OT, will continue to address NMR/sensory reeducation, hand strengthening, and identifying/problem-solving compensatory strategies and AE prn to improve participation and safety w/ IADLs and hopeful safe return to work.  PERFORMANCE DEFICITS in functional skills including coordination, dexterity, proprioception, sensation, strength, FMC, balance, and UE functional use.  IMPAIRMENTS are limiting patient from ADLs, IADLs, and work.   COMORBIDITIES may have co-morbidities  that affects occupational performance. Patient will benefit from skilled OT to address above impairments and improve overall function.   PLAN: OT FREQUENCY: 1x/week  OT DURATION: 6 weeks (may require decreased frequency due to financial burden to pt)  PLANNED INTERVENTIONS: self care/ADL training, therapeutic exercise, therapeutic activity, neuromuscular re-education, manual therapy, functional mobility training, electrical stimulation, fluidotherapy, moist heat, cryotherapy, patient/family education, and DME and/or AE instructions  RECOMMENDED OTHER SERVICES: Pt was evaluated by PT 05/17/22, but was unable to proceed w/ services at that time  CONSULTED AND AGREED WITH PLAN OF CARE: Patient  PLAN FOR NEXT SESSION: Continue w/ sensory reeducation and FMC/coordination activities for R hand; update HEP prn. Review putty exercises and theraband exercises, if needed.   Kathrine Cords, MSOT, OTR/L 07/02/2022, 10:30 AM

## 2022-07-02 NOTE — Patient Instructions (Signed)
RETURN TO DRIVING PLAN:   Communicate with physician first and get clearance before beginning any driving.   Once cleared:  WITH THE SUPERVISION OF A LICENSED DRIVER, PLEASE DRIVE IN AN EMPTY PARKING LOT FOR AT LEAST 2-3 TRIALS TO TEST REACTION TIME, VISION, USE OF EQUIPMENT IN CAR, ETC.   IF SUCCESSFUL WITH THE PARKING LOT DRIVING, PROCEED TO SUPERVISED DRIVING TRIALS IN YOUR NEIGHBORHOOD STREETS AT LOW TRAFFIC TIMES TO TEST OBSERVATION TO TRAFFIC SIGNALS, REACTION TIME, ETC. PLEASE ATTEMPT AT LEAST 2-3 TRIALS IN YOUR NEIGHBORHOOD.   IF NEIGHBORHOOD DRIVING IS SUCCESSFUL, YOU MAY PROCEED TO DRIVING IN BUSIER AREAS IN YOUR COMMUNITY WITH SUPERVISION OF A LICENSED DRIVER. PLEASE ATTEMPT AT LEAST 4-5 TRIALS.   IF COMMUNITY DRIVING IS SUCCESSFUL, YOU MAY PROCEED TO DRIVING ALONE, DURING THE DAY TIME, IN NON-PEAK TRAFFIC TIMES. YOU SHOULD DRIVE NO FURTHER THAN 15 MINUTES IN ONE DIRECTION. PLEASE DO NOT DRIVE IF YOU FEEL FATIGUED OR UNDER THE INFLUENCE OF MEDICATION.   

## 2022-07-06 ENCOUNTER — Telehealth: Payer: Self-pay | Admitting: Medical

## 2022-07-06 ENCOUNTER — Telehealth: Payer: Self-pay

## 2022-07-06 NOTE — Telephone Encounter (Signed)
See below

## 2022-07-06 NOTE — Telephone Encounter (Signed)
Forms faxed and pt notified and forms given to JL for charging

## 2022-07-06 NOTE — Telephone Encounter (Signed)
Opened to review  Aariah Godette, PA-C  

## 2022-07-06 NOTE — Telephone Encounter (Signed)
Filled out pt fmla form today. Please fax. Please make sure charge for service done.

## 2022-07-06 NOTE — Telephone Encounter (Signed)
Pt called to check and see if her Paperwork for [employer] had been faxed yet.   CB number: [phone].

## 2022-07-06 NOTE — Telephone Encounter (Signed)
Forms faxed , please refer to other note

## 2022-07-18 ENCOUNTER — Encounter: Payer: Self-pay | Admitting: Occupational Therapy

## 2022-07-24 NOTE — Therapy (Signed)
OUTPATIENT OCCUPATIONAL THERAPY TREATMENT NOTE  Patient Name: Erin Sloan MRN: [id number] DOB:Jul 01, 1961, 61 y.o., female Today's Date: 07/02/2022  PCP: Elise Benne REFERRING PROVIDER: Elise Benne   OT End of Session - 07/02/22 0934     Visit Number 2    Number of Visits 5    Date for OT Re-Evaluation 07/25/22    Authorization Type Aet[employer]    Authorization Time Period VL: 58    OT Start Time 0931    OT Stop Time 1021    OT Time Calculation (min) 50 min    Activity Tolerance Patient tolerated treatment well    Behavior During Therapy [facility] for tasks assessed/performed            Past Medical History:  Diagnosis Date   COPD (chronic obstructive pulmonary disease) ([place])    Depression    Head ache    Past Surgical History:  Procedure Laterality Date   [place]   Patient Active Problem List   Diagnosis Date Noted   Primary hypertension 05/10/2022   CVA (cerebrovascular accident) ([place]) 11/18/2019   Pulmonary nodules 10/10/2018   Allergic reaction 08/20/2018   Alcohol use 08/20/2018   COPD (chronic obstructive pulmonary disease) ([place]) 08/20/2018   Acute allergic reaction    Tobacco use 08/19/2018   Acute bronchitis 08/19/2018   Hypokalemia 08/19/2018   WRIST PAIN, BILATERAL 02/14/2010   DYSURIA 02/14/2010   SINUSITIS - ACUTE-NOS 12/12/2009    ONSET DATE: 05/05/22  REFERRING DIAG:  R20.0,R20.2 (ICD-10-CM) - Numbness and tingling R20.2 (ICD-10-CM) - Paresthesia R26.9 (ICD-10-CM) - Gait disturbance   THERAPY DIAG:  Numbness and tingling  Other lack of coordination  Rationale for Evaluation and Treatment Rehabilitation  PERTINENT HISTORY: HTN/CVA in August 2023 w/ h/o previous CVA in 2021; PMH also includes COPD and tobacco use  PRECAUTIONS: Fall   SUBJECTIVE:   SUBJECTIVE STATEMENT: Pt reports ***   R-sided numbness feels about the same. Also states she  is still very nervous about driving, but thinks she would be able to do it if she needed to Pt accompanied by:  daughter (remained in lobby)  PAIN: Are you having pain? 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  PATIENT GOALS: "get the numbness to go away"   OBJECTIVE:  ------------------------------------------------------------------------------------------------------------------------------------------------------ [place] UNLESS DESIGNATED OTHERWISE  HAND DOMINANCE: Right  ADLs: Overall ADLs: Mod Ind w/ BADLs, requires extended time; maintains hold on grab bar throughout showering and during tub/shower transfer Equipment: Grab bars  IADLs: Shopping: Accompanied on all shopping trips Housekeeping: Does dishes, requires assist w/ heavy IADLs Meal Prep: Daughter does the cooking; able to complete simple snack/meal prep Community mobility: Not driving at this time Medication management: Independent Handwriting: 100% legible, Increased time, and pt reports it is "sloppy"  MOBILITY STATUS:  Ambulated in/out of session w/out AD; no LOB but reports tripping  FUNCTIONAL OUTCOME MEASURES: Upper Extremity Functional Index: 31/80 Moderate difficulty: lifting a bag of groceries to waist level, preparing food, dressing, doing up buttons, using tools/appliances, cleaning, carrying a small suitcase w/ affected UE Quite a bit of difficulty: usual work/housework, lifting a bag of groceries overhead, pushing up on your hands, vacuuming, sweeping, or raking, tying shoes,  laundry, opening a jar, throwing a ball Extreme difficulty/unable: usual hobbies/recreational activities,  driving  ------------------------------------------------------------------------------------------------------------------------------------------------------  TODAY'S TREATMENT: 07/25/22: ***Continue w/ sensory reeducation and FMC/coordination activities for R hand; update HEP prn. Review putty exercises and theraband exercises, if needed.   07/02/22:  ADLs/IADLs Education provided regarding potential return to driving, including: emphasizing importance of receiving medical clearance from physician, graduated return to driving (see pt instructions), and education on formal driving evaluation in the future, if needed  Education provided related to energy conservation strategies (e.g., planning, pacing, prioritizing, and positioning); discussed pt-specific examples to facilitate understanding  Education provided on potential benefit of formal work evaluation, what is typically involved, and reaching out to MD to set this up  Putty Exercises Gross grasp of putty w/ R hand 25x  Thumb-to-finger opposition completed 5x along length of rolled putty w/ R hand; pt demo'd appropriate pad-to-pad prehension  Pinching and pulling off a small piece of putty, then rolling into a small ball for intrinsic hand/lumbrical strengthening completed 10x w/ R hand   NMR Gentle vibration w/ massager to RUE and hand to facilitate sensory reeducation; OT provided corresponding education, reviewing sensory reeducation strategies she is able to complete at home. Pt tolerated intervention w/ no discomfort.    PATIENT EDUCATION: Ongoing condition-specific education; see tx section above Person educated: Patient Education method: Explanation Education comprehension: verbalized understanding   HOME EXERCISE PROGRAM: Sensory reeducation (printed handout provided) Coordination activities (printed handout provided)  MedBridge Access Code: [email or web address] URL: [email or web address] - Seated Shoulder Flexion  with Dowel to 90  - 1 x daily - 1 sets - 10-15 reps - Seated Shoulder Flexion with Self-Anchored Resistance  - 2 x daily - 2 sets - 10 reps - Seated Shoulder Extension with Resistance  - 2 x daily - 2 sets - 10 reps - Composite Flexion with Putty  - 2 x daily - 1 sets - 25 reps - Thumb Opposition with Putty  - 2 x daily - 4-6 sets - Tip Pinch with Putty  - 2 x daily - 10-15 reps   GOALS: Goals reviewed with patient? Yes  SHORT TERM GOALS: Target date: 06/25/22    Status:  1 Pt will demonstrate independence w/ HEP designed for sensory reeducation, FM and [place], and coordination  Baseline: to be administered Met - 07/02/22     LONG TERM GOALS: Target date: 07/25/22    Status:  1 Pt will improve GMC of RUE for improved participation in higher level IADLs as evidenced by increasing finger to nose test to at least 10 taps in 20 seconds Baseline: 8 in 20 sec Progressing  2 Pt will improve efficiency during functional fine motor tasks (e.g., buttons, ties/laces) by improving time to complete 9-HPT w/ RUE to 38 sec or better Baseline: Right: 43.46 sec; Left: 33.77 sec Progressing  4 Pt will improve grip strength in R hand by 5 lbs for increased functional use in housekeeping and meal prep tasks Baseline: Right: 19 lbs; Left: 36 lbs Met - 07/02/22: 28 lbs  5 Pt will report being able to complete laundry or other heavy housekeeping tasks (e.g., vacuuming) w/ moderate difficulty or better by discharge Baseline: quite a bit of difficulty, per UEFI Progressing     ASSESSMENT:  CLINICAL IMPRESSION: 07/25/22: ***   07/02/22: Pt arrives for first treatment session following initial evaluation on 06/11/22; pt seen once every couple of weeks due to financial considerations. OT reviewed goals w/ pt who is agreeable to plan of care at  this time. Putty exercises introduced for hand strengthening and somatosensory input through R hand for hopeful continued sensory reeducation. OT also reviewed sensory  reeducation strategies and incorporated gentle vibration into session, providing education to facilitate carryover to home; pt verbalized understanding. Per pt report, she had a follow-up w/ her PCP and has been cleared to drive, but written out of work until at least December. OT discussed this w/ pt, providing options for further, more targeted, assessment if she or her following MD find this to needed in the future. In OP OT, will continue to address NMR/sensory reeducation, hand strengthening, and identifying/problem-solving compensatory strategies and AE prn to improve participation and safety w/ IADLs and hopeful safe return to work.   PLAN: OT FREQUENCY: 1x/week  OT DURATION: 6 weeks (may require decreased frequency due to financial burden to pt)  PLANNED INTERVENTIONS: self care/ADL training, therapeutic exercise, therapeutic activity, neuromuscular re-education, manual therapy, functional mobility training, electrical stimulation, fluidotherapy, moist heat, cryotherapy, patient/family education, and DME and/or AE instructions  RECOMMENDED OTHER SERVICES: Pt was evaluated by PT 05/17/22, but was unable to proceed w/ services at that time  CONSULTED AND AGREED WITH PLAN OF CARE: Patient  PLAN FOR NEXT SESSION:  ***    Benito Mccreedy, OTR/L, CHT 07/02/2022, 10:30 AM

## 2022-07-25 ENCOUNTER — Encounter: Payer: Self-pay | Admitting: Rehabilitative and Restorative Service Providers"

## 2022-07-25 ENCOUNTER — Ambulatory Visit: Payer: 59 | Attending: Medical | Admitting: Rehabilitative and Restorative Service Providers"

## 2022-07-25 DIAGNOSIS — R202 Paresthesia of skin: Secondary | ICD-10-CM | POA: Diagnosis present

## 2022-07-25 DIAGNOSIS — R278 Other lack of coordination: Secondary | ICD-10-CM | POA: Diagnosis present

## 2022-07-25 DIAGNOSIS — R2689 Other abnormalities of gait and mobility: Secondary | ICD-10-CM | POA: Insufficient documentation

## 2022-07-25 DIAGNOSIS — R2681 Unsteadiness on feet: Secondary | ICD-10-CM | POA: Insufficient documentation

## 2022-07-25 DIAGNOSIS — R200 Anesthesia of skin: Secondary | ICD-10-CM | POA: Diagnosis not present

## 2022-08-01 ENCOUNTER — Ambulatory Visit: Payer: 59 | Admitting: Medical

## 2022-08-07 ENCOUNTER — Ambulatory Visit: Payer: 59 | Admitting: Medical

## 2022-08-07 ENCOUNTER — Encounter: Payer: Self-pay | Admitting: Medical

## 2022-08-07 VITALS — BP 130/70 | HR 100 | Temp 98.3°F | Resp 18 | Ht 64.0 in | Wt 127.8 lb

## 2022-08-07 DIAGNOSIS — F17200 Nicotine dependence, unspecified, uncomplicated: Secondary | ICD-10-CM | POA: Diagnosis not present

## 2022-08-07 DIAGNOSIS — R202 Paresthesia of skin: Secondary | ICD-10-CM | POA: Diagnosis not present

## 2022-08-07 DIAGNOSIS — I639 Cerebral infarction, unspecified: Secondary | ICD-10-CM | POA: Diagnosis not present

## 2022-08-07 DIAGNOSIS — R269 Unspecified abnormalities of gait and mobility: Secondary | ICD-10-CM | POA: Diagnosis not present

## 2022-08-07 MED ORDER — FLUTICASONE PROPIONATE 50 MCG/ACT NA SUSP: 2.0000 | Freq: Every day | NASAL | 1 refills | Status: DC

## 2022-08-07 MED ORDER — AZITHROMYCIN 250 MG PO TABS: ORAL_TABLET | ORAL | 0 refills | Status: AC

## 2022-08-07 NOTE — Patient Instructions (Addendum)
Cerebrovascular accident with some persistent gait disturbance, right upper/lower extremity paresthesias.  Appears patient not able to work presently based on her described occupation. On discussion your employer does not want you to work if you have continued paresthesia. I have concentration paresthesia will effect fine motor skills/ manipulating small objects at work.   Htn- you bp is well controlled today with toprol xl despite being of losartan due to side effects. Continue toprol xl. Very important that you get blood pressure cuff over the counter. Discussed where you can get. You need to check bp  twice daily and if you become symptomatic. If bp increases could have other stroke.  Recommend complete smoking cessation.  Continue plavix and aspirin.  Do want you to see neurologist for follow from your stroke. I referred to neurologist but they declined since they did not get records yet?  For sinus infection. Rx flonase nasal spray and azithromycin antibiotic.  Follow up in one month or sooner if needed.

## 2022-08-07 NOTE — Progress Notes (Signed)
.....    Subjective:    Patient ID: Erin Sloan, female    DOB: [birth year], 61 y.o.   MRN: [id number]  HPI  Pt in for follow up.  Pt has been going to OT. She has one more session.(See last note. Seeing them for post stroke recovery and training with hopes maybe can return to work?)  Pt has hx of stroke. With persisting paresthesia to rt upper and lower extremity.   She also reports persisting balance issues.  Pt states grip strength is better but she states still feels uncoordinated due to paresthesia sensation   She states losartan was making her nausea consistently. She stopped losartan and nausea resolved. Pt is still taking toprol xl 25 mg daily.  The other day at OT bp was 119 systolic.   Pt also states some recent nasal congestion, sinus pressure, left ear pressure and blowing out some colored mucus for one week. Hx of sinus infections.         Objective:   Physical Exam  General Mental Status- Alert. General Appearance- Not in acute distress.    Skin General: Color- Normal Color. Moisture- Normal Moisture.   Neck Carotid Arteries- Normal color. Moisture- Normal Moisture. No carotid bruits. No JVD.   Chest and Lung Exam Auscultation: Breath Sounds:-Normal.   Cardiovascular Auscultation:Rythm- Regular. Murmurs & Other Heart Sounds:Auscultation of the heart reveals- No Murmurs.   Abdomen Inspection:-Inspeection Normal. Palpation/Percussion:Note:No mass. Palpation and Percussion of the abdomen reveal- Non Tender, Non Distended + BS, no rebound or guarding.      Neurologic Cranial Nerve exam:- CN III-XII intact(No nystagmus), symmetric smile. Strength:- rt  hand grip 4.5/5. left  hand 5/5. On flexion of entire upper ext both 5/5. Sensation- still having decreaeased senation on rt upper extremities.  Rt hand- on monofilament testing decreased sensation rt 3rd digit and 5th digit  Heent- left maxillary sinus pressure. Boggy turbinates. Left canal clear.  Normal tm.       Assessment & Plan:   Patient Instructions  Cerebrovascular accident with some persistent gait disturbance, right upper/lower extremity paresthesias.  Appears patient not able to work presently based on her described occupation. On discussion your employer does not want you to work if you have continued paresthesia. I have concentration paresthesia will effect fine motor skills/ manipulating small objects at work.   Htn- you bp is well controlled today with toprol xl despite being of losartan due to side effects. Continue toprol xl. Very important that you get blood pressure cuff over the counter. Discussed where you can get. You need to check bp daily and if you become symptomatic. If bp increases could have other stroke.  Recommend complete smoking cessation.  Continue plavix and aspirin.  Do want you to see neurologist for follow from your stroke. I referred to neurologist but they declined since they did not get records yet?  For sinus infection. Rx flonase nasal spray and azithromycin antibiotic.  Follow up in one month or sooner if needed.     [name], PA-C

## 2022-08-23 ENCOUNTER — Telehealth: Payer: Self-pay | Admitting: Medical

## 2022-08-23 NOTE — Telephone Encounter (Signed)
Pt wanted to make pcp aware she is filing for disability.

## 2022-08-23 NOTE — Telephone Encounter (Signed)
Can be discussed at appt on 08/31/22

## 2022-08-31 ENCOUNTER — Ambulatory Visit: Payer: 59 | Admitting: Medical

## 2022-08-31 ENCOUNTER — Encounter: Payer: Self-pay | Admitting: Medical

## 2022-08-31 VITALS — BP 144/87 | HR 80 | Resp 18 | Ht 64.0 in | Wt 128.0 lb

## 2022-08-31 DIAGNOSIS — R110 Nausea: Secondary | ICD-10-CM

## 2022-08-31 MED ORDER — ONDANSETRON HCL 4 MG PO TABS: 4.0000 mg | ORAL_TABLET | Freq: Three times a day (TID) | ORAL | 0 refills | Status: DC | PRN

## 2022-08-31 MED ORDER — LOSARTAN POTASSIUM 25 MG PO TABS: 25.0000 mg | ORAL_TABLET | Freq: Every day | ORAL | 11 refills | Status: DC

## 2022-08-31 NOTE — Progress Notes (Addendum)
Subjective:    Patient ID: Erin Sloan, female    DOB: 08-05-1961, 61 y.o.   MRN: [id number]  HPI  Pt in reporting no change in her conditions.   Last visit note A/P  "Cerebrovascular accident with some persistent gait disturbance, right upper/lower extremity paresthesias.  Appears patient not able to work presently based on her described occupation. On discussion your employer does not want you to work if you have continued paresthesia. I have concentration paresthesia will effect fine motor skills/ manipulating small objects at work.    Htn- you bp is well controlled today with toprol xl despite being of losartan due to side effects. Continue toprol xl. Very important that you get blood pressure cuff over the counter. Discussed where you can get. You need to check bp daily and if you become symptomatic. If bp increases could have other stroke.   Recommend complete smoking cessation.   Continue plavix and aspirin."   Pt is trying to get disability now. Pt again tells me that her work won't let her return to work with persistent gait disturbance, right upper/lower extremity paresthesias.  Htn- pt is on losartan 25 mg daily and  toprol xl 25 mg daily. Most of bp readings at home high 140's/high 80.      Review of Systems  Constitutional:  Negative for chills, fatigue and fever.  Respiratory:  Negative for cough, chest tightness, shortness of breath and wheezing.   Cardiovascular:  Negative for chest pain and palpitations.  Gastrointestinal:  Positive for nausea. Negative for abdominal pain.       Nausea intermittent in am. No abd pain. No vomting.   Musculoskeletal:  Negative for back pain.  Skin:  Negative for rash.  Neurological:        See hpi.  Hematological:  Negative for adenopathy. Does not bruise/bleed easily.  Psychiatric/Behavioral:  Negative for behavioral problems, confusion and decreased concentration.        Objective:   Physical Exam  General Mental  Status- Alert. General Appearance- Not in acute distress.    Skin General: Color- Normal Color. Moisture- Normal Moisture.   Neck Carotid Arteries- Normal color. Moisture- Normal Moisture. No carotid bruits. No JVD.   Chest and Lung Exam Auscultation: Breath Sounds:-Normal.   Cardiovascular Auscultation:Rythm- Regular. Murmurs & Other Heart Sounds:Auscultation of the heart reveals- No Murmurs.   Abdomen Inspection:-Inspeection Normal. Palpation/Percussion:Note:No mass. Palpation and Percussion of the abdomen reveal- Non Tender, Non Distended + BS, no rebound or guarding.       Neurologic Cranial Nerve exam:- CN III-XII intact(No nystagmus), symmetric smile. Strength:- rt  hand grip 4.5/5. left  hand 5/5. On flexion of entire upper ext both 5/5. Sensation- still having decreaeased senation on rt upper extremities.   Rt hand- on monofilament testing decreased sensation rt 3rd digit and 5th digit      Assessment & Plan:   Patient Instructions  History of CVA with persisting gait disturbance, right upper extremity/lower extremity paresthesia and some weakness.  On discussion with your work is still indicating cannot return to work under persistent present condition.  I filled out your disability paperwork.  You indicated that you will not be returning to the job and will be trying to get permanent disability.  Form faxed over today.  Hypertension-blood pressure at home is persistently high.  Recommend continuing Toprol XL and to get back on your losartan 25 mg daily dose.  You mention that you were only taking it sporadically since you thought  it was making you nauseous.  However on discussion you note nausea even on days when you were not taking the losartan.  Still want to get back on losartan 25 mg daily and check your blood pressure daily.  If blood pressure still persisting above 140/90 then will increase dose to 50 mg.  Nausea undetermined cause presently.  Will get CMP and  lipase today.  Follow-up in 2 weeks or sooner if needed.   Mackie Pai, PA-C

## 2022-08-31 NOTE — Addendum Note (Signed)
Addended by: Gwenevere Abbot on: 08/31/2022 10:23 AM   Modules accepted: Orders

## 2022-08-31 NOTE — Patient Instructions (Addendum)
History of CVA with persisting gait disturbance, right upper extremity/lower extremity paresthesia and some weakness.  On discussion with your work is still indicating cannot return to work under persistent present condition.  I filled out your disability paperwork.  You indicated that you will not be returning to the job and will be trying to get permanent disability.  Form faxed over today.  Hypertension-blood pressure at home is persistently high.  Recommend continuing Toprol XL and to get back on your losartan 25 mg daily dose.  You mention that you were only taking it sporadically since you thought it was making you nauseous.  However on discussion you note nausea even on days when you were not taking the losartan.  Still want to get back on losartan 25 mg daily and check your blood pressure daily.  If blood pressure still persisting above 140/90 then will increase dose to 50 mg.  Nausea undetermined cause presently.  Will get CMP and lipase today.  Follow-up in 2 weeks or sooner if needed.

## 2022-09-17 ENCOUNTER — Encounter: Payer: Self-pay | Admitting: Medical

## 2022-09-21 ENCOUNTER — Ambulatory Visit (INDEPENDENT_AMBULATORY_CARE_PROVIDER_SITE_OTHER): Payer: 59 | Admitting: Medical

## 2022-09-21 VITALS — BP 128/70 | HR 74 | Temp 98.0°F | Resp 18 | Ht 64.0 in | Wt 129.8 lb

## 2022-09-21 DIAGNOSIS — I10 Essential (primary) hypertension: Secondary | ICD-10-CM

## 2022-09-21 LAB — CBC WITH DIFFERENTIAL/PLATELET
Basophils Absolute: 0 10*3/uL (ref 0.0–0.1)
Basophils Relative: 0.8 % (ref 0.0–3.0)
Eosinophils Absolute: 0.1 10*3/uL (ref 0.0–0.7)
Eosinophils Relative: 1.9 % (ref 0.0–5.0)
HCT: 40.6 % (ref 36.0–46.0)
Hemoglobin: 13.4 g/dL (ref 12.0–15.0)
Lymphocytes Relative: 27.3 % (ref 12.0–46.0)
Lymphs Abs: 1.2 10*3/uL (ref 0.7–4.0)
MCHC: 33 g/dL (ref 30.0–36.0)
MCV: 101.1 fl — ABNORMAL HIGH (ref 78.0–100.0)
Monocytes Absolute: 0.6 10*3/uL (ref 0.1–1.0)
Monocytes Relative: 13 % — ABNORMAL HIGH (ref 3.0–12.0)
Neutro Abs: 2.6 10*3/uL (ref 1.4–7.7)
Neutrophils Relative %: 57 % (ref 43.0–77.0)
Platelets: 216 10*3/uL (ref 150.0–400.0)
RBC: 4.01 Mil/uL (ref 3.87–5.11)
RDW: 12.9 % (ref 11.5–15.5)
WBC: 4.5 10*3/uL (ref 4.0–10.5)

## 2022-09-21 LAB — COMPREHENSIVE METABOLIC PANEL
ALT: 36 U/L — ABNORMAL HIGH (ref 0–35)
AST: 24 U/L (ref 0–37)
Albumin: 3.9 g/dL (ref 3.5–5.2)
Alkaline Phosphatase: 87 U/L (ref 39–117)
BUN: 7 mg/dL (ref 6–23)
CO2: 29 mEq/L (ref 19–32)
Calcium: 9 mg/dL (ref 8.4–10.5)
Chloride: 100 mEq/L (ref 96–112)
Creatinine, Ser: 0.5 mg/dL (ref 0.40–1.20)
GFR: 99.82 mL/min (ref >60.00)
Glucose, Bld: 81 mg/dL (ref 70–99)
Potassium: 4.2 mEq/L (ref 3.5–5.1)
Sodium: 139 mEq/L (ref 135–145)
Total Bilirubin: 0.3 mg/dL (ref 0.2–1.2)
Total Protein: 6.4 g/dL (ref 6.0–8.3)

## 2022-09-21 NOTE — Progress Notes (Signed)
Subjective:    Patient ID: Erin Sloan, female    DOB: [birth year], 62 y.o.   MRN: [id number]  HPI Pt in for follow up. She sent me bp readings that were ok but one was very low at 99/67. She remembers sometimes around her bp checks had loose stoos and vomiting but she can't remember which day she was sick. But majority of her readings she sent by my chart were in 130/80 range.   Pt last 4 readings January 9 112/73, 124/79, 121/75 and 133/88.  Pt bp by CMA was 128/70. Pt machine in office during visit showed 129/76.   Pt not feeling getting light headed when stands after sitting.      Review of Systems  Constitutional:  Negative for chills, fatigue and fever.  Respiratory:  Negative for cough, chest tightness, shortness of breath and wheezing.   Cardiovascular:  Negative for chest pain and palpitations.  Gastrointestinal:  Negative for abdominal pain and constipation.  Musculoskeletal:  Negative for back pain, joint swelling and myalgias.  Psychiatric/Behavioral:  Negative for behavioral problems and decreased concentration.     Past Medical History:  Diagnosis Date   COPD (chronic obstructive pulmonary disease) ([place])    Depression    Head ache      Social History   Socioeconomic History   Marital status: Widowed    Spouse name: Not on file   Number of children: Not on file   Years of education: Not on file   Highest education level: Not on file  Occupational History   Not on file  Tobacco Use   Smoking status: Every Day    Packs/day: 1.50    Years: 48.00    Total pack years: 72.00    Types: Cigarettes   Smokeless tobacco: Never   Tobacco comments:    half pack daily-AH/11/14/2020  Substance and Sexual Activity   Alcohol use: Yes    Comment: socially. moderate now.  years ago drank heavier on weekends.   Drug use: No   Sexual activity: Not Currently  Other Topics Concern   Not on file  Social History Narrative   Not on file   Social Determinants of  Health   Financial Resource Strain: Not on file  Food Insecurity: Not on file  Transportation Needs: Not on file  Physical Activity: Not on file  Stress: Not on file  Social Connections: Not on file  Intimate Partner Violence: Not on file    Past Surgical History:  Procedure Laterality Date   CARPAL [place]    Family History  Problem Relation Age of Onset   Pancreatic cancer Mother    Heart disease Father    Heart attack Father     Allergies  Allergen Reactions   Ibuprofen Swelling   Amoxicillin Hives    Has patient had a PCN reaction causing immediate rash, facial/tongue/throat swelling, SOB or lightheadedness with hypotension: No Has patient had a PCN reaction causing severe rash involving mucus membranes or skin necrosis: No Has patient had a PCN reaction that required hospitalization: No Has patient had a PCN reaction occurring within the last 10 years: No If all of the above answers are "NO", then may proceed with Cephalosporin use.\    Current Outpatient Medications on File Prior to Visit  Medication Sig Dispense Refill   albuterol (VENTOLIN HFA) 108 (90 Base) MCG/ACT inhaler  TAKE 2 PUFFS BY MOUTH EVERY 6 HOURS AS NEEDED FOR WHEEZE OR SHORTNESS OF BREATH 8.5 each 3   aspirin 325 MG tablet Take 325 mg by mouth daily.     atorvastatin (LIPITOR) 40 MG tablet Take 1 tablet (40 mg total) by mouth daily. 90 tablet 3   clopidogrel (PLAVIX) 75 MG tablet Take 1 tablet (75 mg total) by mouth daily. 90 tablet 3   fluticasone (FLONASE) 50 MCG/ACT nasal spray Place 2 sprays into both nostrils daily. 16 g 1   losartan (COZAAR) 25 MG tablet Take 1 tablet (25 mg total) by mouth daily. 30 tablet 3   losartan (COZAAR) 25 MG tablet Take 1 tablet (25 mg total) by mouth daily. 30 tablet 11   metoprolol succinate (TOPROL-XL) 25 MG 24 hr tablet Take 1 tablet (25 mg total) by mouth daily. 90 tablet 3   ondansetron (ZOFRAN)  4 MG tablet Take 1 tablet (4 mg total) by mouth every 8 (eight) hours as needed for nausea or vomiting. 20 tablet 0   umeclidinium-vilanterol (ANORO ELLIPTA) 62.5-25 MCG/ACT AEPB INHALE 1 PUFF BY MOUTH EVERY DAY 60 each 11   No current facility-administered medications on file prior to visit.    BP 128/70   Pulse 74   Temp 98 F (36.7 C)   Resp 18   Ht 5\' 4"  (1.626 m)   Wt 129 lb 12.8 oz (58.9 kg)   SpO2 100%   BMI 22.28 kg/m        Objective:   Physical Exam   General Mental Status- Alert. General Appearance- Not in acute distress.     Chest and Lung Exam Auscultation: Breath Sounds:-Normal.  Cardiovascular Auscultation:Rythm- Regular. Murmurs & Other Heart Sounds:Auscultation of the heart reveals- No Murmurs.   Neurologic Cranial Nerve exam:- CN III-XII intact(No nystagmus), symmetric smile. Strength:- 5/5 equal and symmetric strength both upper and lower extremities.      Assessment & Plan:   Patient Instructions  Htn- your bp is well controlled today when we checked and very close to your machine reading. Unclear why you had brief lower end bp. I think you may have been dehydrated around time of your GI illness.   Continue current bp med regimen. If you note bp trend 427 sytolic or lower consecutively then would need to modify bp med regimen.  Cbc and cmp today.  Follow up in 3 months or sooner if needed.

## 2022-09-21 NOTE — Patient Instructions (Addendum)
Htn- your bp is well controlled today when we checked and very close to your machine reading. Unclear why you had brief lower end bp. I think you may have been dehydrated around time of your GI illness.   Continue current bp med regimen. If you note bp trend 893 systolic or lower consecutively then would need to modify bp med regimen.  Cbc and cmp today.  Follow up in 3 months or sooner if needed.

## 2022-10-03 ENCOUNTER — Telehealth: Payer: Self-pay | Admitting: Medical

## 2022-10-03 NOTE — Telephone Encounter (Signed)
Forms faxed into front office ? ?Placed in bin up front  ?

## 2022-10-04 NOTE — Telephone Encounter (Signed)
Forms placed in provider basket

## 2022-10-05 NOTE — Telephone Encounter (Signed)
Pt paperwork in basket. First want to make sure have correct form. Form state [employer] claimant? Claimant name should be Erin Sloan??   Have pt come in for appointment as well. I though her short term disabitiy had ran out and she was decided she would not return to work?  Form is on your desk.

## 2022-10-08 ENCOUNTER — Ambulatory Visit (INDEPENDENT_AMBULATORY_CARE_PROVIDER_SITE_OTHER): Payer: 59 | Admitting: Pulmonary Disease

## 2022-10-08 ENCOUNTER — Encounter (HOSPITAL_BASED_OUTPATIENT_CLINIC_OR_DEPARTMENT_OTHER): Payer: Self-pay | Admitting: Pulmonary Disease

## 2022-10-08 VITALS — BP 124/74 | HR 78 | Temp 99.9°F | Ht 64.0 in | Wt 129.6 lb

## 2022-10-08 DIAGNOSIS — J432 Centrilobular emphysema: Secondary | ICD-10-CM | POA: Diagnosis not present

## 2022-10-08 DIAGNOSIS — Z720 Tobacco use: Secondary | ICD-10-CM

## 2022-10-08 MED ORDER — FLUTICASONE FUROATE-VILANTEROL 100-25 MCG/ACT IN AEPB: 1.0000 | INHALATION_SPRAY | Freq: Every day | RESPIRATORY_TRACT | 3 refills | Status: DC

## 2022-10-08 NOTE — Telephone Encounter (Signed)
Pt stated she will call [employer] when she gets a chance to get correct paperwork sent over to our office

## 2022-10-08 NOTE — Telephone Encounter (Signed)
Pt called and lvm to return call 

## 2022-10-08 NOTE — Patient Instructions (Addendum)
X Rx for Breo 100 - once daily , rinse mouth after use x 74month with 5 refills  X schedule LDCT chest for screening  X spirometry pre-post

## 2022-10-08 NOTE — Progress Notes (Signed)
   Subjective:    Patient ID: Erin Sloan, female    DOB: Jun 25, 1961, 62 y.o.   MRN: [id number]  HPI  62 yo smoker from [facility] for follow-up of emphysema and pulmonary nodules   PMH - hospitalized 11/2019 for right-sided numbness ,stroke on MRI. She smoked more than 60 pack years  Chief Complaint  Patient presents with   Follow-up    Pt states she had another stroke in 05/05/2022 since LOV. Pt may have to change to a different inhalers due to insurance.   She lives with her daughter who accompanies her for today's visit. She had a stroke with right-sided numbness.  She continues to smoke 2 to 4 cigarettes/day which is much lower than half packs per day prior. She is unable to afford Anoro due to increased cost this year and requests alternative She also requested handicap Placard, she is unable to walk more than 200 feet without resting  Significant tests/ events reviewed  Spirometry 09/2018 ratio 71, FEV1 78%, FVC 84%     LDCT chest 11/2020 emphysema, coronary calcification CT chest 1/ 2021 showed emphysema.  1-2 mm right upper lobe nodule peripherally stable CT chest angiogram 08/2018 moderate emphysema, 2 to 3 mm nodules  Review of Systems neg for any significant sore throat, dysphagia, itching, sneezing, nasal congestion or excess/ purulent secretions, fever, chills, sweats, unintended wt loss, pleuritic or exertional cp, hempoptysis, orthopnea pnd or change in chronic leg swelling. Also denies presyncope, palpitations, heartburn, abdominal pain, nausea, vomiting, diarrhea or change in bowel or urinary habits, dysuria,hematuria, rash, arthralgias, visual complaints, headache, numbness weakness or ataxia.     Objective:   Physical Exam  Gen. Pleasant, well-nourished, in no distress, normal affect ENT - no pallor,icterus, no post nasal drip Neck: No JVD, no thyromegaly, no carotid bruits Lungs: no use of accessory muscles, no dullness to percussion, clear without rales or  rhonchi  Cardiovascular: Rhythm regular, heart sounds  normal, no murmurs or gallops, no peripheral edema Abdomen: soft and non-tender, no hepatosplenomegaly, BS normal. Musculoskeletal: No deformities, no cyanosis or clubbing Neuro:  alert, non focal       Assessment & Plan:

## 2022-10-08 NOTE — Assessment & Plan Note (Signed)
She is unable to afford Anoro, we will switch her to [employer] which is covered on her insurance ideally she should be on triple therapy  Will repeat spirometry to assess lung function

## 2022-10-08 NOTE — Assessment & Plan Note (Signed)
Smoking cessation was again emphasized is the most important intervention. Will schedule low-dose CT chest for lung cancer screening, last was in 11/2020 which we reviewed

## 2022-10-08 NOTE — Telephone Encounter (Signed)
Incorrect paperwork sent to shred

## 2022-10-15 ENCOUNTER — Telehealth (HOSPITAL_BASED_OUTPATIENT_CLINIC_OR_DEPARTMENT_OTHER): Payer: Self-pay | Admitting: Pulmonary Disease

## 2022-10-15 NOTE — Telephone Encounter (Signed)
Pt states Breo 100 script hurts her lungs for a couple hours each time she takes. Since she has stopped taking it and using her Anoro script. Asking if there is another script option for her. Please advise and call patient back.  Pharm:[facility], [address]

## 2022-10-17 NOTE — Telephone Encounter (Signed)
Please advise if medication change alright

## 2022-10-18 NOTE — Telephone Encounter (Signed)
Rigoberto Noel, MD  Darliss Ridgel, CMA; [facility] Triage Pool18 hours ago (3:40 PM)    She can just use albuterol MDI 2 puffs every 6 hours as needed  She also needs to obtain PFTs ASAP so we can clarify if she needs another kind of inhaler   Spoke with pt and scheduled pre and post spirometry at [facility] office on Friday 11/02/22 and OV at [employer] with Dr. Elsworth Soho on Monday 11/05/22. Pt stated understanding of using Albuterol. Nothing further needed at this time.

## 2022-10-19 ENCOUNTER — Encounter: Payer: Self-pay | Admitting: Medical

## 2022-10-19 NOTE — Telephone Encounter (Signed)
Pt said she checked with employer and long term/short term are the same forms. They said the form he has still needs to be completed with current date for it to be updated. Please complete and advise pt when done.

## 2022-10-22 NOTE — Telephone Encounter (Signed)
Appt scheduled

## 2022-10-22 NOTE — Telephone Encounter (Signed)
Let pt know that I saw other paperwork that [employer] sent over. They are asking for records from prior visits. Also this form is the exact same prior form and does state short term. I really think she would benefit from follow update office visit so we can send them that note as well(update on current status). Particularly since she is trying to get long term disability.

## 2022-10-22 NOTE — Telephone Encounter (Signed)
Old form in red folder

## 2022-10-23 ENCOUNTER — Telehealth: Payer: Medicaid Other | Admitting: Medical

## 2022-10-23 NOTE — Progress Notes (Deleted)
   Subjective:    Patient ID: Erin Sloan, female    DOB: 1961-06-28, 62 y.o.   MRN: [id number]  HPI  Virtual Visit via Video Note  I connected with Erin Sloan on 10/23/22 at  9:20 AM EST by a video enabled telemedicine application and verified that I am speaking with the correct person using two identifiers.  Location: Patient: home Provider: office   I discussed the limitations of evaluation and management by telemedicine and the availability of in person appointments. The patient expressed understanding and agreed to proceed.  History of Present Illness: Pt had visit to discuss current condition. She has history of strokes. She has not been working    Observations/Objective:   Assessment and Plan:   Follow Up Instructions:    I discussed the assessment and treatment plan with the patient. The patient was provided an opportunity to ask questions and all were answered. The patient agreed with the plan and demonstrated an understanding of the instructions.   The patient was advised to call back or seek an in-person evaluation if the symptoms worsen or if the condition fails to improve as anticipated.  I provided *** minutes of non-face-to-face time during this encounter.   Mackie Pai, PA-C   Review of Systems     Objective:   Physical Exam        Assessment & Plan:

## 2022-10-23 NOTE — Progress Notes (Signed)
   Subjective:    Patient ID: Erin Sloan, female    DOB: Jun 25, 1961, 62 y.o.   MRN: [id number]  HPI Patient originally was scheduled for virtual visit but we had a poor connection.  Ideally his visit should have been done in person as reason for the visit pertains to patient's attempted get long-term disability.  I advised her that I have given forms to management so medical records can send copy of our office visits.  Advised patient that she should also attempt to get records from her hospitalization for stroke as well as records from occupational therapist that she saw.  Have referred patient to neurologist and she updates me that she never saw the neurologist since they never got records from [facility].  I asked patient to sign a release form to [facility] and have those records release to neurologist.  Then attend  neurologist appointment as that would be helpful in light of the fact that [employer]  assessing her disability no charge for today's phone visit.  I did ask her to schedule follow-up with me on February 26.  Note on today's exam she states she has a same level of functionality.  States has not improved.  She again explained to me that her former employer had concerns that she would fall at work due to some balance issues and concerns about her manual dexterity ability to work and not make mistakes.  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   Review of Systems     Objective:   Physical Exam        Assessment & Plan:

## 2022-10-30 ENCOUNTER — Telehealth: Payer: Self-pay | Admitting: Medical

## 2022-10-30 NOTE — Telephone Encounter (Signed)
[employer] F[employer] faxed in to front office  Placed in to [place] bin up front

## 2022-11-02 ENCOUNTER — Ambulatory Visit: Payer: Medicaid Other | Admitting: Pulmonary Disease

## 2022-11-02 ENCOUNTER — Other Ambulatory Visit: Payer: Self-pay

## 2022-11-02 DIAGNOSIS — J432 Centrilobular emphysema: Secondary | ICD-10-CM

## 2022-11-02 LAB — PULMONARY FUNCTION TEST
FEF 25-75 Post: 1.52 L/sec
FEF 25-75 Pre: 1.24 L/sec
FEF2575-%Change-Post: 21 %
FEF2575-%Pred-Post: 70 %
FEF2575-%Pred-Pre: 57 %
FEV1-%Change-Post: 6 %
FEV1-%Pred-Post: 101 %
FEV1-%Pred-Pre: 95 %
FEV1-Post: 2.42 L
FEV1-Pre: 2.28 L
FEV1FVC-%Change-Post: 4 %
FEV1FVC-%Pred-Pre: 83 %
FEV6-%Change-Post: 4 %
FEV6-%Pred-Post: 119 %
FEV6-%Pred-Pre: 114 %
FEV6-Post: 3.57 L
FEV6-Pre: 3.43 L
FEV6FVC-%Change-Post: 2 %
FEV6FVC-%Pred-Post: 103 %
FEV6FVC-%Pred-Pre: 101 %
FVC-%Change-Post: 1 %
FVC-%Pred-Post: 115 %
FVC-%Pred-Pre: 113 %
FVC-Post: 3.57 L
FVC-Pre: 3.52 L
Post FEV1/FVC ratio: 68 %
Post FEV6/FVC ratio: 100 %
Pre FEV1/FVC ratio: 65 %
Pre FEV6/FVC Ratio: 97 %

## 2022-11-02 NOTE — Patient Instructions (Signed)
Pre/Post Spirometry Performed Today. 

## 2022-11-02 NOTE — Progress Notes (Signed)
Pre/Post Spirometry Performed Today. 

## 2022-11-05 ENCOUNTER — Encounter (HOSPITAL_BASED_OUTPATIENT_CLINIC_OR_DEPARTMENT_OTHER): Payer: Self-pay | Admitting: Pulmonary Disease

## 2022-11-05 ENCOUNTER — Ambulatory Visit (HOSPITAL_BASED_OUTPATIENT_CLINIC_OR_DEPARTMENT_OTHER): Payer: Medicaid Other | Admitting: Pulmonary Disease

## 2022-11-05 VITALS — BP 124/82 | HR 71 | Temp 98.4°F | Ht 63.0 in | Wt 126.8 lb

## 2022-11-05 DIAGNOSIS — J432 Centrilobular emphysema: Secondary | ICD-10-CM | POA: Diagnosis not present

## 2022-11-05 DIAGNOSIS — Z720 Tobacco use: Secondary | ICD-10-CM

## 2022-11-05 NOTE — Assessment & Plan Note (Signed)
Smoking cessation was emphasized as the most important intervention that would add years to her life. She was unable to commit to set a quit date We will proceed with low-dose CT chest screening

## 2022-11-05 NOTE — Progress Notes (Signed)
   Subjective:    Patient ID: Erin Sloan, female    DOB: 12-06-1960, 62 y.o.   MRN: [id number]  HPI  62 yo smoker from [facility] for follow-up of emphysema and pulmonary nodules   PMH - hospitalized 11/2019 for right-sided numbness ,stroke on MRI. She smoked more than 60 pack years  At her last visit in January, she reported that Anoro was very expensive and we switched her to [employer] 100 hurt her lungs for a couple hours each time she takes. Since she has stopped taking it and went back to taking Anoro.  She is on Medicaid now and this seems to be covered. Will also give her handicap placard last visit. She has cut down cigarettes to anywhere from 0 to 3 cigarettes/day She still reports shortness of breath and feels like she needs her inhaler.  She denies cough, wheezing or frequent chest colds   Significant tests/ events reviewed  Arlyce Harman 10/2022 ratio 65 but FEV1 & FVC normal Spirometry 09/2018 ratio 71, FEV1 78%, FVC 84%     LDCT chest 11/2020 emphysema, coronary calcification CT chest 1/ 2021 showed emphysema.  1-2 mm right upper lobe nodule peripherally stable CT chest angiogram 08/2018 moderate emphysema, 2 to 3 mm nodules   Review of Systems neg for any significant sore throat, dysphagia, itching, sneezing, nasal congestion or excess/ purulent secretions, fever, chills, sweats, unintended wt loss, pleuritic or exertional cp, hempoptysis, orthopnea pnd or change in chronic leg swelling. Also denies presyncope, palpitations, heartburn, abdominal pain, nausea, vomiting, diarrhea or change in bowel or urinary habits, dysuria,hematuria, rash, arthralgias, visual complaints, headache, numbness weakness or ataxia.     Objective:   Physical Exam  Gen. Pleasant, well-nourished, in no distress ENT - no thrush, no pallor/icterus,no post nasal drip Neck: No JVD, no thyromegaly, no carotid bruits Lungs: no use of accessory muscles, no dullness to percussion, clear without rales or  rhonchi  Cardiovascular: Rhythm regular, heart sounds  normal, no murmurs or gallops, no peripheral edema Musculoskeletal: No deformities, no cyanosis or clubbing        Assessment & Plan:

## 2022-11-05 NOTE — Assessment & Plan Note (Addendum)
Reviewed spirometry today, although she has airway obstruction, this is minimal and FEV1 is normal.  Emphysema is better seen on her CT scan, we did not obtain DLCO but this is probably on the lower side. She will continue Anoro for symptomatic relief and use albuterol only as needed. We discussed action plan for COPD and signs and symptoms of COPD exacerbation

## 2022-11-05 NOTE — Patient Instructions (Signed)
Continue on Anoro  x schedule low-dose CT chest  Congratulations on cutting down.  Try to quit smoking completely

## 2022-11-06 ENCOUNTER — Telehealth: Payer: Self-pay | Admitting: Medical

## 2022-11-06 NOTE — Telephone Encounter (Signed)
Pt does have an upcoming appt 12/2022 , can her follow up wait until then?

## 2022-11-06 NOTE — Telephone Encounter (Signed)
Pt said Erin Sloan told her to schedule an office visit but she is not sure what its for and lives in [place] and would rather not come in the office unless it's important. Please call pt to advise what the appointment is for.

## 2022-11-07 NOTE — Telephone Encounter (Signed)
Pt states she will keep her April appointment

## 2022-11-07 NOTE — Telephone Encounter (Signed)
Forms were faxed into the front office  Placed in bin up front

## 2022-11-27 ENCOUNTER — Ambulatory Visit (HOSPITAL_BASED_OUTPATIENT_CLINIC_OR_DEPARTMENT_OTHER): Payer: Medicaid Other

## 2022-11-29 ENCOUNTER — Ambulatory Visit (HOSPITAL_BASED_OUTPATIENT_CLINIC_OR_DEPARTMENT_OTHER)
Admission: RE | Admit: 2022-11-29 | Discharge: 2022-11-29 | Disposition: A | Discharge status: Home / Self Care | Payer: Medicaid Other | Source: Ambulatory Visit | Attending: Pulmonary Disease | Admitting: Pulmonary Disease

## 2022-11-29 ENCOUNTER — Other Ambulatory Visit (HOSPITAL_BASED_OUTPATIENT_CLINIC_OR_DEPARTMENT_OTHER): Payer: Self-pay | Admitting: Pulmonary Disease

## 2022-11-29 DIAGNOSIS — Z720 Tobacco use: Secondary | ICD-10-CM

## 2022-11-29 DIAGNOSIS — J432 Centrilobular emphysema: Secondary | ICD-10-CM | POA: Diagnosis not present

## 2022-11-29 DIAGNOSIS — Z87891 Personal history of nicotine dependence: Secondary | ICD-10-CM | POA: Diagnosis not present

## 2022-12-06 ENCOUNTER — Other Ambulatory Visit: Payer: Self-pay | Admitting: Medical

## 2022-12-21 ENCOUNTER — Encounter: Payer: Self-pay | Admitting: Medical

## 2022-12-21 ENCOUNTER — Ambulatory Visit (INDEPENDENT_AMBULATORY_CARE_PROVIDER_SITE_OTHER): Payer: Medicaid Other | Admitting: Medical

## 2022-12-21 VITALS — BP 130/78 | HR 80 | Resp 18 | Ht 63.0 in | Wt 126.0 lb

## 2022-12-21 DIAGNOSIS — I10 Essential (primary) hypertension: Secondary | ICD-10-CM | POA: Diagnosis not present

## 2022-12-21 DIAGNOSIS — T148XXA Other injury of unspecified body region, initial encounter: Secondary | ICD-10-CM

## 2022-12-21 DIAGNOSIS — F17200 Nicotine dependence, unspecified, uncomplicated: Secondary | ICD-10-CM | POA: Diagnosis not present

## 2022-12-21 DIAGNOSIS — Z720 Tobacco use: Secondary | ICD-10-CM

## 2022-12-21 DIAGNOSIS — I639 Cerebral infarction, unspecified: Secondary | ICD-10-CM

## 2022-12-21 DIAGNOSIS — R269 Unspecified abnormalities of gait and mobility: Secondary | ICD-10-CM | POA: Diagnosis not present

## 2022-12-21 DIAGNOSIS — S5011XA Contusion of right forearm, initial encounter: Secondary | ICD-10-CM

## 2022-12-21 NOTE — Progress Notes (Signed)
Subjective:    Patient ID: Erin Sloan, female    DOB: [birth year], 62 y.o.   MRN: [id number]  HPI Pt in for follow up.  Pt states she has been to disability MD and waiting on word from Disability administration.  Pt state she is stumbing at times. She still minipulation/coordination still present rt upper extremity. States rt buttox feels numb like has been sitting on wallet. She states not driving.She states if walks long distance in parking lots increases chance of stumbling. Pt saw pulmonlogist who gave 6 month parking placard. Pt wants 5 year placard.   Pt is still smoking but states a lot less. Some days only 2 cigarettes. Max will be 5 a day.  Last pulmonlogist AVS.  Reviewed spirometry today, although she has airway obstruction, this is minimal and FEV1 is normal.  Emphysema is better seen on her CT scan, we did not obtain DLCO but this is probably on the lower side. She will continue Anoro for symptomatic relief and use albuterol only as needed. We discussed action plan for COPD and signs and symptoms of COPD exacerbation      I had referred pt  to neurologist for follow for cva. Pt has not gotten scheduled since per pt [facility] has not sent records to neurologist.   Type Date User Summary Attachment  General 05/11/2022 10:02 AM Rosezena Sensor Faxed a cover sheet to [facility] at [phone]. She needed that to get [place] the records that we needed -  Note: Faxed a cover sheet to [place] [facility] at [phone]. She needed that to get [place] the records that we needed    Pt has gerd. Controlled with omeprazole.  Pt bp over last 5 days has been well controlled.    Review of Systems  Constitutional:  Negative for chills, fatigue and fever.  HENT:  Negative for congestion and drooling.   Respiratory:  Negative for cough, chest tightness, shortness of breath and wheezing.   Cardiovascular:  Negative for chest pain and palpitations.   Gastrointestinal:  Negative for abdominal pain.  Genitourinary:  Negative for dyspareunia and dysuria.  Musculoskeletal:  Negative for arthralgias and back pain.  Skin:  Negative for rash.  Neurological:  Negative for dizziness, speech difficulty, weakness, numbness and headaches.  Hematological:  Negative for adenopathy. Does not bruise/bleed easily.  Psychiatric/Behavioral:  Negative for behavioral problems and confusion.     Past Medical History:  Diagnosis Date   Allergic reaction 08/20/2018   COPD (chronic obstructive pulmonary disease)    Depression    Head ache      Social History   Socioeconomic History   Marital status: Widowed    Spouse name: Not on file   Number of children: Not on file   Years of education: Not on file   Highest education level: 12th grade  Occupational History   Not on file  Tobacco Use   Smoking status: Every Day    Packs/day: 1.50    Years: 48.00    Additional pack years: 0.00    Total pack years: 72.00    Types: Cigarettes   Smokeless tobacco: Never   Tobacco comments:    Pt states she smokes about 4-5 a day at the most as of 11/05/2022 Almetta Lovely, CMA  Substance and Sexual Activity   Alcohol use: Yes    Comment: socially. moderate now.  years ago drank heavier on weekends.   Drug use: No   Sexual  activity: Not Currently  Other Topics Concern   Not on file  Social History Narrative   Not on file   Social Determinants of Health   Financial Resource Strain: High Risk (12/20/2022)   Overall Financial Resource Strain (CARDIA)    Difficulty of Paying Living Expenses: Very hard  Food Insecurity: Food Insecurity Present (12/20/2022)   Hunger Vital Sign    Worried About Running Out of Food in the Last Year: Sometimes true    Ran Out of Food in the Last Year: Sometimes true  Transportation Needs: No Transportation Needs (12/20/2022)   PRAPARE - Administrator, Civil Service (Medical): No    Lack of Transportation  (Non-Medical): No  Physical Activity: Insufficiently Active (12/20/2022)   Exercise Vital Sign    Days of Exercise per Week: 2 days    Minutes of Exercise per Session: 60 min  Stress: Stress Concern Present (12/20/2022)   [employer] of Occupational Health - Occupational Stress Questionnaire    Feeling of Stress : Very much  Social Connections: Socially Isolated (12/20/2022)   Social Connection and Isolation Panel [NHANES]    Frequency of Communication with Friends and Family: More than three times a week    Frequency of Social Gatherings with Friends and Family: Once a week    Attends Religious Services: Never    Database administrator or Organizations: No    Attends Engineer, structural: Not on file    Marital Status: Widowed  Catering manager Violence: Not on file    Past Surgical History:  Procedure Laterality Date   CARPAL TUNNEL RELEASE     CESAREAN SECTION     1985   CESAREAN SECTION     1987    Family History  Problem Relation Age of Onset   Pancreatic cancer Mother    Heart disease Father    Heart attack Father     Allergies  Allergen Reactions   Ibuprofen Swelling   Amoxicillin Hives    Has patient had a PCN reaction causing immediate rash, facial/tongue/throat swelling, SOB or lightheadedness with hypotension: No Has patient had a PCN reaction causing severe rash involving mucus membranes or skin necrosis: No Has patient had a PCN reaction that required hospitalization: No Has patient had a PCN reaction occurring within the last 10 years: No If all of the above answers are "NO", then may proceed with Cephalosporin use.\    Current Outpatient Medications on File Prior to Visit  Medication Sig Dispense Refill   albuterol (VENTOLIN HFA) 108 (90 Base) MCG/ACT inhaler TAKE 2 PUFFS BY MOUTH EVERY 6 HOURS AS NEEDED FOR WHEEZE OR SHORTNESS OF BREATH 8.5 each 3   ANORO ELLIPTA 62.5-25 MCG/ACT AEPB INHALE 1 PUFF BY MOUTH EVERY DAY 60 each 4   aspirin 325  MG tablet Take 325 mg by mouth daily.     atorvastatin (LIPITOR) 40 MG tablet Take 1 tablet (40 mg total) by mouth daily. 90 tablet 3   clopidogrel (PLAVIX) 75 MG tablet Take 1 tablet (75 mg total) by mouth daily. 90 tablet 3   losartan (COZAAR) 25 MG tablet Take 1 tablet (25 mg total) by mouth daily. 30 tablet 11   metoprolol succinate (TOPROL-XL) 25 MG 24 hr tablet Take 1 tablet (25 mg total) by mouth daily. 90 tablet 3   ondansetron (ZOFRAN) 4 MG tablet TAKE 1 TABLET BY MOUTH EVERY 8 HOURS AS NEEDED FOR NAUSEA AND VOMITING 20 tablet 0   No current facility-administered  medications on file prior to visit.    BP (!) 146/86   Pulse 80   Resp 18   Ht 5\' 3"  (1.6 m)   Wt 126 lb (57.2 kg)   SpO2 100%   BMI 22.32 kg/m        Objective:   Physical Exam  General Mental Status- Alert. General Appearance- Not in acute distress.   Skin Small bruising to rt forearms.  Neck Carotid Arteries- Normal color. Moisture- Normal Moisture. No carotid bruits. No JVD.  Chest and Lung Exam Auscultation: Breath Sounds:-Normal.  Cardiovascular Auscultation:Rythm- Regular. Murmurs & Other Heart Sounds:Auscultation of the heart reveals- No Murmurs.  Abdomen Inspection:-Inspeection Normal. Palpation/Percussion:Note:No mass. Palpation and Percussion of the abdomen reveal- Non Tender, Non Distended + BS, no rebound or guarding.   Neurologic Cranial Nerve exam:- CN III-XII intact(No nystagmus), symmetric smile. Drift Test:- No drift. Romberg Exam:- Negative.  Heal to Toe Gait exam:-Normal. Finger to Nose:- Normal/Intact Strength:- 2-3/5  strength rt upper and lower extremities. Normal 5/5 left upper and lower ext.        Assessment & Plan:   Patient Instructions  1. Cerebrovascular accident (CVA), unspecified mechanism. Contine plavix. Record issue prevents referral. Ask you discuss with person below at [facility] so you can get records sent. Type Date User Summary Attachment  General  05/11/2022 10:02 AM Rosezena Sensor Faxed a cover sheet to [facility] at [phone]. She needed that to get [place] the records that we needed -  Note: Faxed a cover sheet to [place] at [facility] at [phone]. She needed that to get [place] the records that we needed   2. Gait disturbance Persisting  post CVA. I would recommend start to use walker.   3. Primary hypertension Bp well controlled. Continue toprol and losartan.   4. Smoker Continue smoking cessation efforts.  Bruising intermittent related to bumping things when walking. Last cbc platelets normal. If random bruising no reason or nose bleeds then get repeat cbc.  Follow up 3 months or sooner if needed      [employer], PA-C

## 2022-12-21 NOTE — Patient Instructions (Addendum)
1. Cerebrovascular accident (CVA), unspecified mechanism. Contine plavix. Record issue prevents referral. Ask you discuss with person below at [facility] so you can get records sent. Type Date User Summary Attachment  General 05/11/2022 10:02 AM Erin Sloan Faxed a cover sheet to [facility] at [facility] at [phone]. She needed that to get [place] the records that we needed -  Note: Faxed a cover sheet to [place] at [facility] at [phone]. She needed that to get [place] the records that we needed   2. Gait disturbance Persisting  post CVA. I would recommend start to use walker. Rx walker given.  3. Primary hypertension Bp well controlled. Continue toprol and losartan.   4. Smoker Continue smoking cessation efforts.  Bruising intermittent related to bumping things when walking. Last cbc platelets normal. If random bruising no reason or nose bleeds then get repeat cbc.  Follow up 3 months or sooner if needed

## 2023-01-31 ENCOUNTER — Telehealth: Payer: Self-pay | Admitting: Medical

## 2023-01-31 MED ORDER — OMEPRAZOLE 40 MG PO CPDR: 40.0000 mg | DELAYED_RELEASE_CAPSULE | Freq: Every day | ORAL | 11 refills | Status: DC

## 2023-01-31 NOTE — Addendum Note (Signed)
Addended by: Gwenevere Abbot on: 01/31/2023 02:00 PM   Modules accepted: Orders

## 2023-01-31 NOTE — Telephone Encounter (Signed)
Pt requesting omeprazole 40 mg to be sent [facility] [place]. Pt said her prescription expired.

## 2023-02-27 ENCOUNTER — Encounter: Payer: Self-pay | Admitting: *Deleted

## 2023-02-27 ENCOUNTER — Telehealth: Payer: Self-pay | Admitting: *Deleted

## 2023-02-27 NOTE — Patient Outreach (Signed)
  Care Coordination   Initial Visit Note   02/27/2023 Name: Erin Sloan MRN: [id number] DOB: 03-31-1961  Erin Sloan is a 62 y.o. year old female who sees [place], Ramon Dredge, [place] for primary care. I spoke with  Erin Sloan by phone today.  What matters to the patients health and wellness today?  "I am doing okay for the most part; still having (R) sided numbness and weakness, but I am pretty much independent in my self-care.  I manage all of my medicines by myself.  I live with my daughter and she takes me wherever I need to go, because I don't drive myself anymore.  I am using my walker and my cane if I need to because my balance is still off after the stroke in August 2023.  I am still checking my blood pressures at home and they are running between 120-138/ 70-86.  I do still smoke, but only a couple cigarettes a day"  SDOH assessments and interventions completed:  Yes  SDOH Interventions Today    Flowsheet Row Most Recent Value  SDOH Interventions   Food Insecurity Interventions Intervention Not Indicated  [currently denies food insecurity]  Housing Interventions Intervention Not Indicated  [reports resides with adult daughter x 7 years in single family modular home]  Transportation Interventions Intervention Not Indicated  [daughter provides transportation]       Care Coordination Interventions:  Yes, provided   Interventions Today    Flowsheet Row Most Recent Value  Chronic Disease   Chronic disease during today's visit Hypertension (HTN)  General Interventions   General Interventions Discussed/Reviewed General Interventions Discussed, Durable Medical Equipment (DME), Doctor Visits  [Patient declines need for ongoing/ further care coordination outreach,  no care coordination needs identified at time of care coordination call today,  provided my direct contact information should questions/ concerns/ needs arise post- call today]  Doctor Visits Discussed/Reviewed Doctor  Visits Discussed, Doctor Visits Reviewed, PCP  Erin Sloan 12/21/22 PCP OV and confirmed patient has plans to attend upcoming scheduled 03/22/23 PCP follow up visit]  Durable Medical Equipment (DME) BP Cuff, Walker, Other  [cane,  confirmed has grab bar in shower]  PCP/Specialist Visits Compliance with follow-up visit  Education Interventions   Education Provided Provided Education  Provided Verbal Education On Medication, Other  [purpose of medications,  reinforced signs/ symptoms CVA,  value of monitoring-recording BP's at home,  reviewed recent BP's at home,  reinforced benefits of smoking cessation- she declines need for ongoing care coordination/ resources today]  Nutrition Interventions   Nutrition Discussed/Reviewed Nutrition Discussed  Pharmacy Interventions   Pharmacy Dicussed/Reviewed Pharmacy Topics Discussed  [Full medication reconciliation/ review completed,  no concerns or discrepancies identified,  self-manages medications and denies questions/ concerns around medications today]  Safety Interventions   Safety Discussed/Reviewed Safety Discussed, Fall Risk  [Fall assessment completed- no falls x > 12 months,  uses cane/ walker as indicated,  continues to have (R) sided numbness post- CVA in August 2023]      Follow up plan: No further intervention required.   Encounter Outcome:  Pt. Visit Completed   Caryl Pina, RN, BSN, CCRN Alumnus RN CM Care Coordination/ Transition of Care- [facility] Care Management [phone]: direct office

## 2023-03-22 ENCOUNTER — Ambulatory Visit: Payer: Medicaid Other | Admitting: Medical

## 2023-03-22 VITALS — BP 132/70 | HR 79 | Temp 98.0°F | Resp 16 | Ht 63.0 in | Wt 127.2 lb

## 2023-03-22 DIAGNOSIS — Z8673 Personal history of transient ischemic attack (TIA), and cerebral infarction without residual deficits: Secondary | ICD-10-CM

## 2023-03-22 DIAGNOSIS — R202 Paresthesia of skin: Secondary | ICD-10-CM | POA: Diagnosis not present

## 2023-03-22 DIAGNOSIS — Z720 Tobacco use: Secondary | ICD-10-CM

## 2023-03-22 DIAGNOSIS — H6993 Unspecified Eustachian tube disorder, bilateral: Secondary | ICD-10-CM

## 2023-03-22 DIAGNOSIS — I10 Essential (primary) hypertension: Secondary | ICD-10-CM

## 2023-03-22 DIAGNOSIS — R269 Unspecified abnormalities of gait and mobility: Secondary | ICD-10-CM

## 2023-03-22 DIAGNOSIS — H6690 Otitis media, unspecified, unspecified ear: Secondary | ICD-10-CM | POA: Diagnosis not present

## 2023-03-22 LAB — LIPID PANEL
Cholesterol: 181 mg/dL (ref 0–200)
HDL: 92.8 mg/dL (ref >39.00)
LDL Cholesterol: 69 mg/dL (ref 0–99)
NonHDL: 88.5
Total CHOL/HDL Ratio: 2
Triglycerides: 96 mg/dL (ref 0.0–149.0)
VLDL: 19.2 mg/dL (ref 0.0–40.0)

## 2023-03-22 LAB — COMPREHENSIVE METABOLIC PANEL
ALT: 29 U/L (ref 0–35)
AST: 26 U/L (ref 0–37)
Albumin: 4 g/dL (ref 3.5–5.2)
Alkaline Phosphatase: 102 U/L (ref 39–117)
BUN: 17 mg/dL (ref 6–23)
CO2: 30 mEq/L (ref 19–32)
Calcium: 9.5 mg/dL (ref 8.4–10.5)
Chloride: 99 mEq/L (ref 96–112)
Creatinine, Ser: 0.53 mg/dL (ref 0.40–1.20)
GFR: 98.08 mL/min (ref >60.00)
Glucose, Bld: 91 mg/dL (ref 70–99)
Potassium: 4.2 mEq/L (ref 3.5–5.1)
Sodium: 135 mEq/L (ref 135–145)
Total Bilirubin: 0.5 mg/dL (ref 0.2–1.2)
Total Protein: 6.8 g/dL (ref 6.0–8.3)

## 2023-03-22 MED ORDER — METHYLPREDNISOLONE 4 MG PO TABS: ORAL_TABLET | ORAL | 0 refills | Status: DC

## 2023-03-22 MED ORDER — AZITHROMYCIN 250 MG PO TABS: ORAL_TABLET | ORAL | 0 refills | Status: AC

## 2023-03-22 NOTE — Patient Instructions (Addendum)
1. Tobacco use Continue to try to stop completely - Lipid panel  2. History of CVA (cerebrovascular accident) Continue plavix. Recommend low dose aspiring 81 mg rather than 325 mg you are on. - Lipid panel -continue atorvastatin.  3. Hypertension, unspecified type bp is controlled. Continue losartan ad toprol. - Comp Met (CMET) - Lipid panel  4. Paresthesia Stable post stroke.  5. Gait disturbance Stable post stroke.  6. Dysfunction of both eustachian tubes 3 day taper medrol since declined nasal steroid  7. Ear infection Left side mild red. Rx azithromycin.   Follow up date to be determined after lab review.

## 2023-03-22 NOTE — Progress Notes (Signed)
Subjective:    Patient ID: Erin Sloan, female    DOB: 02-23-1961, 62 y.o.   MRN: [id number]  HPI Pt in for follow up.  Pt states she did get disability.   Last AVS was below.  "1. Cerebrovascular accident (CVA), unspecified mechanism. Contine plavix. Record issue prevents referral. Ask you discuss with person below at [facility] so you can get records sent. Type Date User Summary Attachment  General 05/11/2022 10:02 AM Rosezena Sensor Faxed a cover sheet to [facility] at [facility] at [phone]. She needed that to get [place] the records that we needed -  Note: Faxed a cover sheet to [place] at [facility] at [phone]. She needed that to get [place] the records that we needed    2. Gait disturbance Persisting  post CVA. I would recommend start to use walker.    3. Primary hypertension Bp well controlled. Continue toprol and losartan.    4. Smoker Continue smoking cessation efforts.   Bruising intermittent related to bumping things when walking. Last cbc platelets normal. If random bruising no reason or nose bleeds then get repeat cbc."   Pt tells me she never followed up with neurologist as [place] neurologist would never release her records unless she paid outstanding bill and local neurologist would not see her since they required records.  Pt gait has no improved.  Htn- bp is controlled. Pt on losartan ad toprol.  Pt still smoking. But now much less just 1-2 cigarretes  a day on average. Pt sees Dr. Vassie Loll pulmonologist.  3 weeks ago had mild nasal congestion like a cold. Now better but lingering ear pressure.  Review of Systems  Constitutional:  Negative for chills, fatigue and fever.  HENT:  Negative for congestion and drooling.   Respiratory:  Negative for cough, chest tightness, shortness of breath and wheezing.   Cardiovascular:  Negative for chest pain and palpitations.  Gastrointestinal:  Negative for abdominal pain.  Genitourinary:  Negative for  dyspareunia and dysuria.  Musculoskeletal:  Negative for arthralgias and back pain.  Skin:  Negative for rash.  Neurological:        See hpi.  Hematological:  Negative for adenopathy. Does not bruise/bleed easily.  Psychiatric/Behavioral:  Negative for behavioral problems and confusion.     Past Medical History:  Diagnosis Date   Allergic reaction 08/20/2018   COPD (chronic obstructive pulmonary disease) (HCC)    Depression    Head ache      Social History   Socioeconomic History   Marital status: Widowed    Spouse name: Not on file   Number of children: Not on file   Years of education: Not on file   Highest education level: 12th grade  Occupational History   Not on file  Tobacco Use   Smoking status: Every Day    Current packs/day: 1.50    Average packs/day: 1.5 packs/day for 48.0 years (72.0 ttl pk-yrs)    Types: Cigarettes   Smokeless tobacco: Never   Tobacco comments:    Pt states she smokes about 4-5 a day at the most as of 11/05/2022 Almetta Lovely, CMA  Substance and Sexual Activity   Alcohol use: Yes    Comment: socially. moderate now.  years ago drank heavier on weekends.   Drug use: No   Sexual activity: Not Currently  Other Topics Concern   Not on file  Social History Narrative   Not on file   Social Determinants of Health   Financial  Resource Strain: High Risk (12/20/2022)   Overall Financial Resource Strain (CARDIA)    Difficulty of Paying Living Expenses: Very hard  Food Insecurity: No Food Insecurity (02/27/2023)   Hunger Vital Sign    Worried About Running Out of Food in the Last Year: Never true    Ran Out of Food in the Last Year: Never true  Recent Concern: Food Insecurity - Food Insecurity Present (12/20/2022)   Hunger Vital Sign    Worried About Running Out of Food in the Last Year: Sometimes true    Ran Out of Food in the Last Year: Sometimes true  Transportation Needs: No Transportation Needs (02/27/2023)   PRAPARE - Therapist, art (Medical): No    Lack of Transportation (Non-Medical): No  Physical Activity: Insufficiently Active (12/20/2022)   Exercise Vital Sign    Days of Exercise per Week: 2 days    Minutes of Exercise per Session: 60 min  Stress: Stress Concern Present (12/20/2022)   [employer] of Occupational Health - Occupational Stress Questionnaire    Feeling of Stress : Very much  Social Connections: Socially Isolated (12/20/2022)   Social Connection and Isolation Panel [NHANES]    Frequency of Communication with Friends and Family: More than three times a week    Frequency of Social Gatherings with Friends and Family: Once a week    Attends Religious Services: Never    Database administrator or Organizations: No    Attends Engineer, structural: Not on file    Marital Status: Widowed  Catering manager Violence: Not on file    Past Surgical History:  Procedure Laterality Date   CARPAL TUNNEL RELEASE     CESAREAN SECTION     1985   CESAREAN SECTION     1987    Family History  Problem Relation Age of Onset   Pancreatic cancer Mother    Heart disease Father    Heart attack Father     Allergies  Allergen Reactions   Ibuprofen Swelling   Amoxicillin Hives    Has patient had a PCN reaction causing immediate rash, facial/tongue/throat swelling, SOB or lightheadedness with hypotension: No Has patient had a PCN reaction causing severe rash involving mucus membranes or skin necrosis: No Has patient had a PCN reaction that required hospitalization: No Has patient had a PCN reaction occurring within the last 10 years: No If all of the above answers are "NO", then may proceed with Cephalosporin use.\    Current Outpatient Medications on File Prior to Visit  Medication Sig Dispense Refill   albuterol (VENTOLIN HFA) 108 (90 Base) MCG/ACT inhaler TAKE 2 PUFFS BY MOUTH EVERY 6 HOURS AS NEEDED FOR WHEEZE OR SHORTNESS OF BREATH 8.5 each 3   ANORO ELLIPTA 62.5-25  MCG/ACT AEPB INHALE 1 PUFF BY MOUTH EVERY DAY 60 each 4   aspirin 325 MG tablet Take 325 mg by mouth daily.     atorvastatin (LIPITOR) 40 MG tablet Take 1 tablet (40 mg total) by mouth daily. 90 tablet 3   clopidogrel (PLAVIX) 75 MG tablet Take 1 tablet (75 mg total) by mouth daily. 90 tablet 3   losartan (COZAAR) 25 MG tablet Take 1 tablet (25 mg total) by mouth daily. 30 tablet 11   metoprolol succinate (TOPROL-XL) 25 MG 24 hr tablet Take 1 tablet (25 mg total) by mouth daily. 90 tablet 3   omeprazole (PRILOSEC) 40 MG capsule Take 1 capsule (40 mg total) by mouth daily.  30 capsule 11   ondansetron (ZOFRAN) 4 MG tablet TAKE 1 TABLET BY MOUTH EVERY 8 HOURS AS NEEDED FOR NAUSEA AND VOMITING 20 tablet 0   No current facility-administered medications on file prior to visit.    BP 132/70 (BP Location: Left Arm, Patient Position: Sitting, Cuff Size: Normal)   Pulse 79   Temp 98 F (36.7 C) (Oral)   Resp 16   Ht 5\' 3"  (1.6 m)   Wt 127 lb 3.2 oz (57.7 kg)   SpO2 99%   BMI 22.53 kg/m        Objective:   Physical Exam  General Mental Status- Alert. General Appearance- Not in acute distress.    Skin Small bruising to rt forearms.   Neck Carotid Arteries- Normal color. Moisture- Normal Moisture. No carotid bruits. No JVD.   Chest and Lung Exam Auscultation: Breath Sounds:-Normal.   Cardiovascular Auscultation:Rythm- Regular. Murmurs & Other Heart Sounds:Auscultation of the heart reveals- No Murmurs.   Abdomen Inspection:-Inspeection Normal. Palpation/Percussion:Note:No mass. Palpation and Percussion of the abdomen reveal- Non Tender, Non Distended + BS, no rebound or guarding.     Neurologic Cranial Nerve exam:- CN III-XII intact(No nystagmus), symmetric smile. Drift Test:- No drift. Romberg Exam:- Negative.  Heal to Toe Gait exam:-Normal. Finger to Nose:- Normal/Intact Strength:- 2-3/5  strength rt upper and lower extremities. Normal 5/5 left upper and lower ext.        Assessment & Plan:   Patient Instructions  1. Tobacco use Continue to try to stop completely - Lipid panel  2. History of CVA (cerebrovascular accident) Continue plavix. Recommend low dose aspiring 81 mg rather than 325 mg you are on. - Lipid panel  3. Hypertension, unspecified type bp is controlled. Continue losartan ad toprol. - Comp Met (CMET) - Lipid panel  4. Paresthesia Stable post stroke.  5. Gait disturbance Stable post stroke.  6. Dysfunction of both eustachian tubes 3 day taper medrol since declined nasal steroid  7. Ear infection Left side mild red. Rx azithromycin.   Follow up date to be determined after lab review.   [name], PA-C

## 2023-05-01 ENCOUNTER — Other Ambulatory Visit: Payer: Self-pay | Admitting: Medical

## 2023-05-08 ENCOUNTER — Telehealth: Payer: Self-pay | Admitting: Pulmonary Disease

## 2023-05-08 MED ORDER — ANORO ELLIPTA 62.5-25 MCG/ACT IN AEPB: INHALATION_SPRAY | RESPIRATORY_TRACT | 2 refills | Status: DC

## 2023-05-08 NOTE — Telephone Encounter (Signed)
Rx for Anoro was refilled  I called pt and left detailed msg that this was done and asked that she call back to schedule her f/u which is due now  Closing encounter

## 2023-05-31 ENCOUNTER — Other Ambulatory Visit: Payer: Self-pay | Admitting: Medical

## 2023-06-11 ENCOUNTER — Ambulatory Visit (INDEPENDENT_AMBULATORY_CARE_PROVIDER_SITE_OTHER): Payer: Medicaid Other | Admitting: Physician Assistant

## 2023-06-11 VITALS — BP 122/80 | HR 89 | Temp 98.6°F | Resp 16 | Ht 63.0 in | Wt 124.1 lb

## 2023-06-11 DIAGNOSIS — J441 Chronic obstructive pulmonary disease with (acute) exacerbation: Secondary | ICD-10-CM | POA: Diagnosis not present

## 2023-06-11 MED ORDER — DOXYCYCLINE HYCLATE 100 MG PO TABS
100.0000 mg | ORAL_TABLET | Freq: Two times a day (BID) | ORAL | 0 refills | Status: AC
Start: 2023-06-11 — End: 2023-06-18

## 2023-06-11 MED ORDER — PREDNISONE 20 MG PO TABS
20.0000 mg | ORAL_TABLET | Freq: Every day | ORAL | 0 refills | Status: DC
Start: 2023-06-11 — End: 2023-07-25

## 2023-06-11 MED ORDER — IPRATROPIUM-ALBUTEROL 0.5-2.5 (3) MG/3ML IN SOLN: 3.0000 mL | Freq: Four times a day (QID) | RESPIRATORY_TRACT | 1 refills | Status: DC | PRN

## 2023-06-11 MED ORDER — IPRATROPIUM-ALBUTEROL 0.5-2.5 (3) MG/3ML IN SOLN
3.0000 mL | Freq: Once | RESPIRATORY_TRACT | Status: AC
Start: 2023-06-11 — End: 2023-06-11
  Administered 2023-06-11: 3 mL via RESPIRATORY_TRACT

## 2023-06-11 NOTE — Progress Notes (Signed)
Established patient visit   Patient: Erin Sloan   DOB: 1960-12-11   62 y.o. Female  MRN: [id number] Visit Date: 06/11/2023  Today's healthcare provider: Alfredia Ferguson, PA-C   Chief Complaint  Patient presents with   Cough    X1 week, Chest congestion, denies fever, chills, Headache, runny nose   Subjective    HPI   Pt reports her son in law is also sick -- for the last week, cough productive with clear mucous, chest congestion, worse when lying flat, difficulty breathing. Using delsym otc and her albuterol inhaler q 6 hours.  She denies fevers.   Medications: Outpatient Medications Prior to Visit  Medication Sig   albuterol (VENTOLIN HFA) 108 (90 Base) MCG/ACT inhaler TAKE 2 PUFFS BY MOUTH EVERY 6 HOURS AS NEEDED FOR WHEEZE OR SHORTNESS OF BREATH   aspirin 325 MG tablet Take 325 mg by mouth daily.   atorvastatin (LIPITOR) 40 MG tablet TAKE 1 TABLET BY MOUTH EVERY DAY   clopidogrel (PLAVIX) 75 MG tablet TAKE 1 TABLET BY MOUTH EVERY DAY   losartan (COZAAR) 25 MG tablet Take 1 tablet (25 mg total) by mouth daily.   metoprolol succinate (TOPROL-XL) 25 MG 24 hr tablet TAKE 1 TABLET (25 MG TOTAL) BY MOUTH DAILY.   omeprazole (PRILOSEC) 40 MG capsule Take 1 capsule (40 mg total) by mouth daily.   umeclidinium-vilanterol (ANORO ELLIPTA) 62.5-25 MCG/ACT AEPB INHALE 1 PUFF BY MOUTH EVERY DAY   methylPREDNISolone (MEDROL) 4 MG tablet 3 tab po day 1 , 2 day po day 2 and 1 tab po day 3 (Patient not taking: Reported on 06/11/2023)   ondansetron (ZOFRAN) 4 MG tablet TAKE 1 TABLET BY MOUTH EVERY 8 HOURS AS NEEDED FOR NAUSEA AND VOMITING (Patient not taking: Reported on 06/11/2023)   No facility-administered medications prior to visit.    Review of Systems  Constitutional:  Negative for fatigue and fever.  Respiratory:  Positive for cough, shortness of breath and wheezing.   Cardiovascular:  Negative for chest pain and leg swelling.  Gastrointestinal:  Negative for abdominal pain.   Neurological:  Negative for dizziness and headaches.       Objective    BP 122/80   Pulse 89   Temp 98.6 F (37 C) (Oral)   Resp 16   Ht 5\' 3"  (1.6 m)   Wt 124 lb 2 oz (56.3 kg)   SpO2 95%   BMI 21.99 kg/m   Physical Exam Constitutional:      General: She is awake.     Appearance: She is well-developed.  HENT:     Head: Normocephalic.  Eyes:     Conjunctiva/sclera: Conjunctivae normal.  Cardiovascular:     Rate and Rhythm: Normal rate and regular rhythm.     Heart sounds: Normal heart sounds.  Pulmonary:     Effort: Pulmonary effort is normal.     Breath sounds: Wheezing and rhonchi present.  Skin:    General: Skin is warm.  Neurological:     Mental Status: She is alert and oriented to person, place, and time.  Psychiatric:        Attention and Perception: Attention normal.        Mood and Affect: Mood normal.        Speech: Speech normal.        Behavior: Behavior is cooperative.      No results found for any visits on 06/11/23.  Assessment & Plan  1. COPD exacerbation (HCC) Duoneb administered in office, b/l wheezing appreciated on exam Post exam O2 99% and wheezing much improved.  Rx doxy, prednisone.  Advised pt to try to get a nebulizer for home use-- will also try to get one through DME. Rx duoneb for home use, q 6 hours prn   - For home use only DME Nebulizer machine - ipratropium-albuterol (DUONEB) 0.5-2.5 (3) MG/3ML nebulizer solution 3 mL - predniSONE (DELTASONE) 20 MG tablet; Take 1 tablet (20 mg total) by mouth daily with breakfast.  Dispense: 5 tablet; Refill: 0 - doxycycline (VIBRA-TABS) 100 MG tablet; Take 1 tablet (100 mg total) by mouth 2 (two) times daily for 7 days.  Dispense: 14 tablet; Refill: 0   Return if symptoms worsen or fail to improve.      I, Alfredia Ferguson, PA-C have reviewed all documentation for this visit. The documentation on  06/11/23   for the exam, diagnosis, procedures, and orders are all accurate and complete.     Alfredia Ferguson, PA-C  [facility] [phone] (phone) [phone] (fax)  [facility]

## 2023-06-25 ENCOUNTER — Other Ambulatory Visit (HOSPITAL_BASED_OUTPATIENT_CLINIC_OR_DEPARTMENT_OTHER): Payer: Self-pay | Admitting: Pulmonary Disease

## 2023-06-27 ENCOUNTER — Telehealth (HOSPITAL_BASED_OUTPATIENT_CLINIC_OR_DEPARTMENT_OTHER): Payer: Self-pay | Admitting: Pulmonary Disease

## 2023-06-27 MED ORDER — ALBUTEROL SULFATE HFA 108 (90 BASE) MCG/ACT IN AERS: INHALATION_SPRAY | RESPIRATORY_TRACT | 3 refills | Status: DC

## 2023-06-27 NOTE — Telephone Encounter (Signed)
Rx sent 

## 2023-06-27 NOTE — Telephone Encounter (Signed)
[facility] [place] regarding her Albuterol script being filled, but has not heard yet. Patient was told to call us. Patient is almost out. OV scheduled on 07/31/23 with RA  [facility] [place] [facility] [address]

## 2023-07-25 ENCOUNTER — Ambulatory Visit: Payer: Medicaid Other | Admitting: Medical

## 2023-07-25 ENCOUNTER — Encounter: Payer: Self-pay | Admitting: Medical

## 2023-07-25 VITALS — BP 120/70 | HR 78 | Temp 98.2°F | Resp 18 | Ht 63.0 in | Wt 121.0 lb

## 2023-07-25 DIAGNOSIS — I2510 Atherosclerotic heart disease of native coronary artery without angina pectoris: Secondary | ICD-10-CM

## 2023-07-25 DIAGNOSIS — R059 Cough, unspecified: Secondary | ICD-10-CM | POA: Diagnosis not present

## 2023-07-25 MED ORDER — PREDNISONE 10 MG (21) PO TBPK
ORAL_TABLET | ORAL | 0 refills | Status: DC
Start: 2023-07-25 — End: 2023-07-31

## 2023-07-25 MED ORDER — DOXYCYCLINE HYCLATE 100 MG PO TABS: 100.0000 mg | ORAL_TABLET | Freq: Two times a day (BID) | ORAL | 0 refills | Status: DC

## 2023-07-25 MED ORDER — BENZONATATE 100 MG PO CAPS: 100.0000 mg | ORAL_CAPSULE | Freq: Three times a day (TID) | ORAL | 0 refills | Status: AC | PRN

## 2023-07-25 NOTE — Patient Instructions (Signed)
Sinusitis and Bronchitis Recurrent symptoms of sinus pressure, colored mucus, chest congestion, and productive cough. Previous treatment with doxycycline and prednisone taper provided temporary relief. -Prescribe doxycycline twice daily for 10 days. -Prescribe prednisone taper over 6 days (10mg  tablets:6, 5, 4, 3, 2, 1). -Prescribe cough tablets benzonatate -Continue Flonase nasal spray to relieve sinus pressure. -since you have pulmonologist appt in one week will hold off on cxr today. But if symptoms worsen before next week then get cxr   Follow-up appointment scheduled for November 27th at 8:20am to assess response to treatment.

## 2023-07-25 NOTE — Progress Notes (Addendum)
Subjective:    Patient ID: Erin Sloan, female    DOB: Jan 30, 1961, 61 y.o.   MRN: [id number]  HPI Discussed the use of AI scribe software for clinical note transcription with the patient, who gave verbal consent to proceed.  History of Present Illness   The patient, with a history of recurrent initial onset of  respiratory infections, presents with a two-week history of chest congestion, coughing, and sinus pressure. She describes the cough as productive with colored mucus. She also reports sinus pressure and colored nasal discharge. as well as productive cough The symptoms had initially improved with a course of doxycycline and prednisone, but have since recurred 2 weeks ago  The patient attributes the recurrent infections to exposure from her twin grandsons who are in fourth grade.        Review of Systems  Constitutional:  Negative for fatigue.  HENT:  Positive for congestion and sinus pressure.   Respiratory:  Positive for cough and wheezing. Negative for choking.   Cardiovascular:  Negative for chest pain and palpitations.  Gastrointestinal:  Negative for blood in stool and constipation.  Musculoskeletal:  Negative for back pain and myalgias.  Skin:  Negative for rash.  Neurological:  Negative for dizziness and headaches.  Hematological:  Negative for adenopathy. Does not bruise/bleed easily.  Psychiatric/Behavioral:  Negative for behavioral problems and confusion.    Past Medical History:  Diagnosis Date   Allergic reaction 08/20/2018   COPD (chronic obstructive pulmonary disease) (HCC)    Depression    Head ache      Social History   Socioeconomic History   Marital status: Widowed    Spouse name: Not on file   Number of children: Not on file   Years of education: Not on file   Highest education level: 12th grade  Occupational History   Not on file  Tobacco Use   Smoking status: Every Day    Current packs/day: 1.50    Average packs/day: 1.5 packs/day for  48.0 years (72.0 ttl pk-yrs)    Types: Cigarettes   Smokeless tobacco: Never   Tobacco comments:    Pt states she smokes about 4-5 a day at the most as of 11/05/2022 Almetta Lovely, CMA  Substance and Sexual Activity   Alcohol use: Yes    Comment: socially. moderate now.  years ago drank heavier on weekends.   Drug use: No   Sexual activity: Not Currently  Other Topics Concern   Not on file  Social History Narrative   Not on file   Social Determinants of Health   Financial Resource Strain: Low Risk  (07/24/2023)   Overall Financial Resource Strain (CARDIA)    Difficulty of Paying Living Expenses: Not hard at all  Food Insecurity: No Food Insecurity (07/24/2023)   Hunger Vital Sign    Worried About Running Out of Food in the Last Year: Never true    Ran Out of Food in the Last Year: Never true  Transportation Needs: No Transportation Needs (07/24/2023)   PRAPARE - Administrator, Civil Service (Medical): No    Lack of Transportation (Non-Medical): No  Physical Activity: Inactive (07/24/2023)   Exercise Vital Sign    Days of Exercise per Week: 0 days    Minutes of Exercise per Session: 60 min  Stress: Stress Concern Present (12/20/2022)   [employer] of Occupational Health - Occupational Stress Questionnaire    Feeling of Stress : Very much  Social Connections: Socially Isolated (  07/24/2023)   Social Connection and Isolation Panel [NHANES]    Frequency of Communication with Friends and Family: More than three times a week    Frequency of Social Gatherings with Friends and Family: More than three times a week    Attends Religious Services: Never    Database administrator or Organizations: No    Attends Banker Meetings: Not on file    Marital Status: Widowed  Catering manager Violence: Not on file    Past Surgical History:  Procedure Laterality Date   CARPAL TUNNEL RELEASE     CESAREAN SECTION     1985   CESAREAN SECTION     1987     Family History  Problem Relation Age of Onset   Pancreatic cancer Mother    Heart disease Father    Heart attack Father     Allergies  Allergen Reactions   Ibuprofen Swelling   Amoxicillin Hives    Has patient had a PCN reaction causing immediate rash, facial/tongue/throat swelling, SOB or lightheadedness with hypotension: No Has patient had a PCN reaction causing severe rash involving mucus membranes or skin necrosis: No Has patient had a PCN reaction that required hospitalization: No Has patient had a PCN reaction occurring within the last 10 years: No If all of the above answers are "NO", then may proceed with Cephalosporin use.\    Current Outpatient Medications on File Prior to Visit  Medication Sig Dispense Refill   albuterol (VENTOLIN HFA) 108 (90 Base) MCG/ACT inhaler TAKE 2 PUFFS BY MOUTH EVERY 6 HOURS AS NEEDED FOR WHEEZE OR SHORTNESS OF BREATH 8.5 each 3   aspirin 325 MG tablet Take 325 mg by mouth daily.     atorvastatin (LIPITOR) 40 MG tablet TAKE 1 TABLET BY MOUTH EVERY DAY 90 tablet 3   clopidogrel (PLAVIX) 75 MG tablet TAKE 1 TABLET BY MOUTH EVERY DAY 90 tablet 3   ipratropium-albuterol (DUONEB) 0.5-2.5 (3) MG/3ML SOLN Take 3 mLs by nebulization every 6 (six) hours as needed (wheezing, chest tightness). 360 mL 1   losartan (COZAAR) 25 MG tablet Take 1 tablet (25 mg total) by mouth daily. 30 tablet 11   methylPREDNISolone (MEDROL) 4 MG tablet 3 tab po day 1 , 2 day po day 2 and 1 tab po day 3 (Patient not taking: Reported on 06/11/2023) 6 tablet 0   metoprolol succinate (TOPROL-XL) 25 MG 24 hr tablet TAKE 1 TABLET (25 MG TOTAL) BY MOUTH DAILY. 90 tablet 3   omeprazole (PRILOSEC) 40 MG capsule Take 1 capsule (40 mg total) by mouth daily. 30 capsule 11   ondansetron (ZOFRAN) 4 MG tablet TAKE 1 TABLET BY MOUTH EVERY 8 HOURS AS NEEDED FOR NAUSEA AND VOMITING (Patient not taking: Reported on 06/11/2023) 18 tablet 1   umeclidinium-vilanterol (ANORO ELLIPTA) 62.5-25 MCG/ACT  AEPB INHALE 1 PUFF BY MOUTH EVERY DAY 60 each 2   No current facility-administered medications on file prior to visit.    BP 120/70   Pulse 78   Temp 98.2 F (36.8 C)   Resp 18   Ht 5\' 3"  (1.6 m)   Wt 121 lb (54.9 kg)   SpO2 94%   BMI 21.43 kg/m           Objective:   Physical Exam  General Mental Status- Alert. General Appearance- Not in acute distress.   Skin General: Color- Normal Color. Moisture- Normal Moisture.  Neck Carotid Arteries- Normal color. Moisture- Normal Moisture. No carotid bruits. No JVD.  Chest and Lung Exam Auscultation: Breath Sounds:-clear, even and unlabored but shallow.  Cardiovascular Auscultation:Rythm- Regular. Murmurs & Other Heart Sounds:Auscultation of the heart reveals- No Murmurs.  Abdomen Inspection:-Inspeection Normal. Palpation/Percussion:Note:No mass. Palpation and Percussion of the abdomen reveal- Non Tender, Non Distended + BS, no rebound or guarding.   Neurologic Cranial Nerve exam:- CN III-XII intact(No nystagmus), symmetric smile. Strength:- 5/5 equal and symmetric strength both upper and lower extremities.   Heent- frontal and maxillary sinus pressure to palpation.       Assessment & Plan:  Assessment and Plan    Sinusitis and Bronchitis Recurrent symptoms of sinus pressure, colored mucus, chest congestion, and productive cough. Previous treatment with doxycycline and prednisone taper provided temporary relief. -Prescribe doxycycline twice daily for 10 days. -Prescribe prednisone taper over 6 days (10mg  tablets:6, 5, 4, 3, 2, 1). -Prescribe cough tablets benzonatate -Continue Flonase nasal spray to relieve sinus pressure. -since you have pulmonologist appt in one week will hold off on cxr today. But if symptoms worsen before next week then get cxr  Follow-up appointment scheduled for November 27th at 8:20am to assess response to treatment.

## 2023-07-31 ENCOUNTER — Encounter (HOSPITAL_BASED_OUTPATIENT_CLINIC_OR_DEPARTMENT_OTHER): Payer: Self-pay | Admitting: Pulmonary Disease

## 2023-07-31 ENCOUNTER — Ambulatory Visit (HOSPITAL_BASED_OUTPATIENT_CLINIC_OR_DEPARTMENT_OTHER): Payer: Medicaid Other | Admitting: Pulmonary Disease

## 2023-07-31 ENCOUNTER — Ambulatory Visit (HOSPITAL_BASED_OUTPATIENT_CLINIC_OR_DEPARTMENT_OTHER): Payer: Medicaid Other

## 2023-07-31 VITALS — BP 124/72 | HR 78 | Resp 16 | Ht 63.0 in | Wt 123.9 lb

## 2023-07-31 DIAGNOSIS — J209 Acute bronchitis, unspecified: Secondary | ICD-10-CM

## 2023-07-31 DIAGNOSIS — J441 Chronic obstructive pulmonary disease with (acute) exacerbation: Secondary | ICD-10-CM

## 2023-07-31 DIAGNOSIS — Z720 Tobacco use: Secondary | ICD-10-CM

## 2023-07-31 MED ORDER — PREDNISONE 10 MG (21) PO TBPK: ORAL_TABLET | ORAL | 0 refills | Status: DC

## 2023-07-31 MED ORDER — PREDNISONE 10 MG PO TABS: 10.0000 mg | ORAL_TABLET | Freq: Every day | ORAL | 0 refills | Status: DC

## 2023-07-31 NOTE — Progress Notes (Signed)
Subjective:    Patient ID: Erin Sloan, female    DOB: 1961/06/15, 62 y.o.   MRN: [id number]  HPI  62 yo smoker from [facility] for follow-up of emphysema and pulmonary nodules   PMH - hospitalized 11/2019 for right-sided numbness ,stroke on MRI. She smoked more than 60 pack years  Chief Complaint  Patient presents with   Follow-up    Dr Alvira Monday has her on antibiotic and prednisone. Her told her to talk to you about a chest xray.     Discussed the use of AI scribe software for clinical note transcription with the patient, who gave verbal consent to proceed.  History of Present Illness   The patient, with a history of two strokes and lung disease, presents with persistent respiratory symptoms. She reports a recent illness, suspected to be a respiratory infection, which was treated with doxycycline, Medrol, and prednisone. Despite this treatment, the patient continues to experience coughing and congestion. The cough produces sputum, which was initially yellowish but has since started to clear. The patient notes that the coughing is worse at night, preventing her from sleeping, despite having her bed elevated. She has been taking Tessalon pearls for the cough.  The patient also reports using Anoro daily for her lung disease and has been using a nebulizer with DuoNeb. She expresses a need for a new nebulizer, as the one she is currently using is old and was originally for her grandchild.  The patient denies smoking heavily but admits to occasional use and expresses a desire for nicotine patches. She also mentions that she has been placed on disability due to numbness resulting from her previous strokes. This has resulted in lifestyle changes, including the inability to drive, requiring her daughter to transport her to appointments.  The patient is adamant about taking her medications consistently, including albuterol and Anoro, to prevent further health complications. She expresses  frustration with her recurrent illness and the impact it has on her quality of life.       Significant tests/ events reviewed   Cleda Daub 10/2022 ratio 65 but FEV1 & FVC normal Spirometry 09/2018 ratio 71, FEV1 78%, FVC 84%   LDCT 11/2022 RADS 2  LDCT chest 11/2020 emphysema, coronary calcification CT chest 1/ 2021 showed emphysema.  1-2 mm right upper lobe nodule peripherally stable CT chest angiogram 08/2018 moderate emphysema, 2 to 3 mm nodules  Review of Systems neg for any significant sore throat, dysphagia, itching, sneezing, nasal congestion or excess/ purulent secretions, fever, chills, sweats, unintended wt loss, pleuritic or exertional cp, hempoptysis, orthopnea pnd or change in chronic leg swelling. Also denies presyncope, palpitations, heartburn, abdominal pain, nausea, vomiting, diarrhea or change in bowel or urinary habits, dysuria,hematuria, rash, arthralgias, visual complaints, headache, numbness weakness or ataxia.     Objective:   Physical Exam  Gen. Pleasant, well-nourished, in no distress ENT - no thrush, no pallor/icterus,no post nasal drip Neck: No JVD, no thyromegaly, no carotid bruits Lungs: no use of accessory muscles, no dullness to percussion, occ rhonchi  Cardiovascular: Rhythm regular, heart sounds  normal, no murmurs or gallops, no peripheral edema Musculoskeletal: No deformities, no cyanosis or clubbing         Assessment & Plan:    Chest x-ray reviewed, does not show any evidence of pneumonia Assessment and Plan    Acute bronchitis Persistent cough and congestion despite recent courses of doxycycline, Medrol, and prednisone. Sputum initially yellow, now clear. Wheezing on exam. -Order chest x-ray today to rule  out pneumonia. -If pneumonia is present, prescribe additional antibiotics. -If no pneumonia, observe for a few days off prednisone. -Prescribe additional prednisone to be taken if wheezing persists after a few days off current  course. -Recommend over-the-counter Delsym cough syrup, three times a day.  COPD exacerbation Daily use of Anoro and albuterol via nebulizer. -Continue Anoro and albuterol as prescribed. -Refill Anoro prescription today. -Order a new nebulizer through a DME company, pending insurance approval.  Smoking Cessation Patient reports minimal smoking and interest in nicotine patches. -Discuss nicotine replacement therapy options at next visit.  Stroke History Patient reports numbness and inability to drive following two previous strokes. -Continue current stroke prevention measures.  Influenza Vaccination Patient declines due to past experience of illness post-vaccination. -No action taken.

## 2023-07-31 NOTE — Patient Instructions (Addendum)
X CXR today  xPrednisone 10 mg tabs  Take 2 tabs daily with food x 5ds, then 1 tab daily with food x 5ds then STOP  X RX to DME for new neb  VISIT SUMMARY:  Today, we discussed your ongoing respiratory symptoms, including your persistent cough and congestion, despite recent treatments. We also reviewed your COPD management, smoking cessation interest, and stroke history. Additionally, we addressed your need for a new nebulizer and your concerns about the flu vaccine.  YOUR PLAN:  -RESPIRATORY INFECTION: You have a persistent cough and congestion, which may be due to a respiratory infection. We will do a chest x-ray today to check for pneumonia. If pneumonia is found, you will get more antibiotics. If not, we will observe your symptoms for a few days without prednisone. You will also get more prednisone to use if wheezing continues. I recommend using Delsym cough syrup three times a day.  -COPD: Chronic Obstructive Pulmonary Disease (COPD) is a lung condition that makes it hard to breathe. Continue using Anoro and albuterol as prescribed. We will refill your Anoro prescription today and order a new nebulizer for you, pending insurance approval.  -SMOKING CESSATION: You expressed interest in quitting smoking and using nicotine patches. We will discuss nicotine replacement therapy options at your next visit.  -STROKE HISTORY: You have a history of two strokes, which has caused numbness and affected your ability to drive. Continue with your current stroke prevention measures.  INSTRUCTIONS:  Please get a chest x-ray today. If pneumonia is found, you will be prescribed additional antibiotics. If there is no pneumonia, observe your symptoms for a few days without prednisone. Use the additional prednisone if wheezing persists. Use Delsym cough syrup three times a day. Continue using Anoro and albuterol as prescribed. We will refill your Anoro prescription and order a new nebulizer for you. We will  discuss nicotine replacement therapy options at your next visit.

## 2023-08-01 DIAGNOSIS — J441 Chronic obstructive pulmonary disease with (acute) exacerbation: Secondary | ICD-10-CM | POA: Diagnosis not present

## 2023-08-04 ENCOUNTER — Other Ambulatory Visit: Payer: Self-pay | Admitting: Physician Assistant

## 2023-08-04 DIAGNOSIS — J441 Chronic obstructive pulmonary disease with (acute) exacerbation: Secondary | ICD-10-CM

## 2023-08-07 ENCOUNTER — Ambulatory Visit: Payer: Medicaid Other | Admitting: Medical

## 2023-08-11 ENCOUNTER — Telehealth: Payer: Self-pay | Admitting: Medical

## 2023-08-11 ENCOUNTER — Other Ambulatory Visit: Payer: Self-pay | Admitting: Pulmonary Disease

## 2023-08-11 NOTE — Telephone Encounter (Signed)
I have a overnight oximetry order from [place]. Looks like [place] name on order on form. Sent for me to sign. Pt has established pulmonologist. For my patients I refer to pulmonologist to order study and manage sleep apnea/cpap if patient has dx sleep apnea or snores. So will defer to pulmonologist. Will discuss  possibe referral back for sleep apnea if indicated on pt next appt with me.

## 2023-08-12 ENCOUNTER — Ambulatory Visit: Payer: Medicaid Other | Admitting: Medical

## 2023-08-12 ENCOUNTER — Telehealth: Payer: Self-pay | Admitting: Pulmonary Disease

## 2023-08-12 ENCOUNTER — Other Ambulatory Visit (HOSPITAL_BASED_OUTPATIENT_CLINIC_OR_DEPARTMENT_OTHER): Payer: Self-pay

## 2023-08-12 MED ORDER — ANORO ELLIPTA 62.5-25 MCG/ACT IN AEPB: INHALATION_SPRAY | RESPIRATORY_TRACT | 11 refills | Status: DC

## 2023-08-12 NOTE — Telephone Encounter (Signed)
[facility] 

## 2023-08-12 NOTE — Telephone Encounter (Signed)
Patient needs refill of Anoro. She's has been out for 5 days.  [facility] [address] [place]

## 2023-08-19 ENCOUNTER — Encounter (HOSPITAL_BASED_OUTPATIENT_CLINIC_OR_DEPARTMENT_OTHER): Payer: Self-pay

## 2023-08-19 ENCOUNTER — Telehealth (HOSPITAL_BASED_OUTPATIENT_CLINIC_OR_DEPARTMENT_OTHER): Payer: Self-pay | Admitting: Pulmonary Disease

## 2023-08-19 MED ORDER — LEVOFLOXACIN 500 MG PO TABS: 500.0000 mg | ORAL_TABLET | Freq: Every day | ORAL | 0 refills | Status: DC

## 2023-08-19 NOTE — Telephone Encounter (Signed)
Sent to provider 

## 2023-08-19 NOTE — Telephone Encounter (Signed)
Chest x-ray showed patchy infiltrates. Seems like she is still coughing. We will send in Levaquin for 5 days

## 2023-08-27 ENCOUNTER — Other Ambulatory Visit: Payer: Self-pay | Admitting: Medical

## 2023-10-23 ENCOUNTER — Other Ambulatory Visit (HOSPITAL_BASED_OUTPATIENT_CLINIC_OR_DEPARTMENT_OTHER): Payer: Self-pay | Admitting: Pulmonary Disease

## 2023-10-29 ENCOUNTER — Other Ambulatory Visit: Payer: Self-pay | Admitting: Physician Assistant

## 2023-10-29 DIAGNOSIS — J441 Chronic obstructive pulmonary disease with (acute) exacerbation: Secondary | ICD-10-CM

## 2023-11-19 ENCOUNTER — Telehealth: Payer: Self-pay | Admitting: Acute Care

## 2023-11-19 ENCOUNTER — Other Ambulatory Visit: Payer: Self-pay

## 2023-11-19 DIAGNOSIS — Z122 Encounter for screening for malignant neoplasm of respiratory organs: Secondary | ICD-10-CM

## 2023-11-19 DIAGNOSIS — F17210 Nicotine dependence, cigarettes, uncomplicated: Secondary | ICD-10-CM

## 2023-11-19 DIAGNOSIS — Z87891 Personal history of nicotine dependence: Secondary | ICD-10-CM

## 2023-11-19 NOTE — Telephone Encounter (Signed)
 Lung Cancer Screening Narrative/Criteria Questionnaire (Cigarette Smokers Only- No Cigars/Pipes/vapes)   Erin Sloan   SDMV:12/04/23 at 130pm/KRISTEN                                           06/15/1961              LDCT: 12/06/23 at 0900a/ [facility]    62 y.o.   Phone: [phone]  Lung Screening Narrative (confirm age 22-77 yrs Medicare / 50-80 yrs Private pay insurance)   Insurance information:medicaid   Referring Provider:Al[facility]    This screening involves an initial phone call with a team member from our program. It is called a shared decision making visit. The initial meeting is required by insurance and Medicare to make sure you understand the program. This appointment takes about 15-20 minutes to complete. The CT scan will completed at a separate date/time. This scan takes about 5-10 minutes to complete and you may eat and drink before and after the scan.  Criteria questions for Lung Cancer Screening:   Are you a current or former smoker? Current Age began smoking: 63 yo   If you are a former smoker, what year did you quit smoking? NA (within 15 yrs)   To calculate your smoking history, I need an accurate estimate of how many packs of cigarettes you smoked per day and for how many years. (Not just the number of PPD you are now smoking)   Years smoking 52 x Packs per day 1 = Pack years 52   (at least 20 pack yrs)   (Make sure they understand that we need to know how much they have smoked in the past, not just the number of PPD they are smoking now)  Do you have a personal history of cancer?  No    Do you have a family history of cancer? Yes  (cancer type and and relative) mother/pancreatic and mat aunt/breast  Are you coughing up blood?  No  Have you had unexplained weight loss of 15 lbs or more in the last 6 months? No  It looks like you meet all criteria.     Additional information: N/A

## 2023-12-04 ENCOUNTER — Ambulatory Visit

## 2023-12-04 DIAGNOSIS — F17210 Nicotine dependence, cigarettes, uncomplicated: Secondary | ICD-10-CM

## 2023-12-04 DIAGNOSIS — Z720 Tobacco use: Secondary | ICD-10-CM

## 2023-12-04 NOTE — Patient Instructions (Signed)

## 2023-12-04 NOTE — Progress Notes (Signed)
 Virtual Visit via Telephone Note  I connected with Erin Sloan on 12/04/23 at  1:30 PM EDT by telephone and verified that I am speaking with the correct person using two identifiers.  Location: Patient: in home Provider: [address], [place], [place], [address]    Shared Decision Making Visit Lung Cancer Screening Program [phone])   Eligibility: Age 63 y.o. Pack Years Smoking History Calculation 52 (# packs/per year x # years smoked) Recent History of coughing up blood  no Unexplained weight loss? no ( >Than 15 pounds within the last 6 months ) Prior History Lung / other cancer no (Diagnosis within the last 5 years already requiring surveillance chest CT Scans). Smoking Status Current Smoker Former Smokers: Years since quit: NA  Quit Date: NA  Visit Components: Discussion included one or more decision making aids. yes Discussion included risk/benefits of screening. yes Discussion included potential follow up diagnostic testing for abnormal scans. yes Discussion included meaning and risk of over diagnosis. yes Discussion included meaning and risk of False Positives. yes Discussion included meaning of total radiation exposure. yes  Counseling Included: Importance of adherence to annual lung cancer LDCT screening. yes Impact of comorbidities on ability to participate in the program. yes Ability and willingness to under diagnostic treatment. yes  Smoking Cessation Counseling: Current Smokers:  Discussed importance of smoking cessation. yes Information about tobacco cessation classes and interventions provided to patient. yes Patient provided with "ticket" for LDCT Scan. yes Symptomatic Patient. no  Counseling NA Diagnosis Code: Tobacco Use Z72.0 Asymptomatic Patient yes  Counseling (Intermediate counseling: > three minutes counseling) M5784 Former Smokers:  Discussed the importance of maintaining cigarette abstinence. yes Diagnosis Code: Personal History of  Nicotine Dependence. O96.295 Information about tobacco cessation classes and interventions provided to patient. Yes Patient provided with "ticket" for LDCT Scan. yes Written Order for Lung Cancer Screening with LDCT placed in Epic. Yes (CT Chest Lung Cancer Screening Low Dose W/O CM) [id number] Z12.2-Screening of respiratory organs Z87.891-Personal history of nicotine dependence   Karl Bales, RN 12/04/23

## 2023-12-06 ENCOUNTER — Ambulatory Visit (HOSPITAL_BASED_OUTPATIENT_CLINIC_OR_DEPARTMENT_OTHER)
Admission: RE | Admit: 2023-12-06 | Discharge: 2023-12-06 | Disposition: A | Discharge status: Home / Self Care | Source: Ambulatory Visit | Attending: *Deleted | Admitting: *Deleted

## 2023-12-06 DIAGNOSIS — I700 Atherosclerosis of aorta: Secondary | ICD-10-CM | POA: Insufficient documentation

## 2023-12-06 DIAGNOSIS — F17210 Nicotine dependence, cigarettes, uncomplicated: Secondary | ICD-10-CM | POA: Diagnosis present

## 2023-12-06 DIAGNOSIS — Z87891 Personal history of nicotine dependence: Secondary | ICD-10-CM

## 2023-12-06 DIAGNOSIS — Z122 Encounter for screening for malignant neoplasm of respiratory organs: Secondary | ICD-10-CM | POA: Insufficient documentation

## 2023-12-06 DIAGNOSIS — J439 Emphysema, unspecified: Secondary | ICD-10-CM | POA: Diagnosis not present

## 2023-12-06 DIAGNOSIS — I2510 Atherosclerotic heart disease of native coronary artery without angina pectoris: Secondary | ICD-10-CM | POA: Diagnosis not present

## 2024-01-02 ENCOUNTER — Other Ambulatory Visit: Payer: Self-pay | Admitting: Medical

## 2024-01-02 DIAGNOSIS — J441 Chronic obstructive pulmonary disease with (acute) exacerbation: Secondary | ICD-10-CM

## 2024-01-06 ENCOUNTER — Other Ambulatory Visit: Payer: Self-pay | Admitting: Acute Care

## 2024-01-06 DIAGNOSIS — Z122 Encounter for screening for malignant neoplasm of respiratory organs: Secondary | ICD-10-CM

## 2024-01-06 DIAGNOSIS — Z87891 Personal history of nicotine dependence: Secondary | ICD-10-CM

## 2024-01-06 DIAGNOSIS — F17210 Nicotine dependence, cigarettes, uncomplicated: Secondary | ICD-10-CM

## 2024-01-12 ENCOUNTER — Encounter: Payer: Self-pay | Admitting: Medical

## 2024-01-12 NOTE — Addendum Note (Signed)
 Addended by: Serafina Damme on: 01/12/2024 08:39 AM   Modules accepted: Orders

## 2024-01-23 DIAGNOSIS — R519 Headache, unspecified: Secondary | ICD-10-CM | POA: Insufficient documentation

## 2024-01-23 DIAGNOSIS — F32A Depression, unspecified: Secondary | ICD-10-CM | POA: Insufficient documentation

## 2024-01-27 ENCOUNTER — Ambulatory Visit

## 2024-01-27 VITALS — BP 116/72 | HR 77 | Ht 64.0 in | Wt 122.6 lb

## 2024-01-27 DIAGNOSIS — R0602 Shortness of breath: Secondary | ICD-10-CM | POA: Insufficient documentation

## 2024-01-27 DIAGNOSIS — I10 Essential (primary) hypertension: Secondary | ICD-10-CM | POA: Diagnosis not present

## 2024-01-27 DIAGNOSIS — R0789 Other chest pain: Secondary | ICD-10-CM | POA: Insufficient documentation

## 2024-01-27 DIAGNOSIS — I2510 Atherosclerotic heart disease of native coronary artery without angina pectoris: Secondary | ICD-10-CM | POA: Diagnosis not present

## 2024-01-27 NOTE — Assessment & Plan Note (Addendum)
 Likely multifactorial setting of COPD and longstanding history of smoking.  From cardiac standpoint we will evaluate for any ischemia with stress test as reviewed above.

## 2024-01-27 NOTE — Patient Instructions (Signed)
 Medication Instructions:  Your physician recommends that you continue on your current medications as directed. Please refer to the Current Medication list given to you today.  *If you need a refill on your cardiac medications before your next appointment, please call your pharmacy*  Lab Work: None If you have labs (blood work) drawn today and your tests are completely normal, you will receive your results only by: MyChart Message (if you have MyChart) OR A paper copy in the mail If you have any lab test that is abnormal or we need to change your treatment, we will call you to review the results.  Testing/Procedures:   [facility] [address] [place], [place] [address] Phone:  [phone]    Please arrive 15 minutes prior to your appointment time for registration and insurance purposes.  The test will take approximately 3 to 4 hours to complete; you may bring reading material.  If someone comes with you to your appointment, they will need to remain in the main lobby due to limited space in the testing area. **If you are pregnant or breastfeeding, please notify the nuclear lab prior to your appointment**  How to prepare for your Myocardial Perfusion Test: Do not eat or drink 3 hours prior to your test, except you may have water. Do not consume products containing caffeine  (regular or decaffeinated) 12 hours prior to your test. (ex: coffee, chocolate, sodas, tea). Do bring a list of your current medications with you.  If not listed below, you may take your medications as normal. Do not take metoprolol  (Lopressor , Toprol ) for 24 hours prior to the test.  Bring the medication to your appointment as you may be required to take it once the test is complete. Do wear comfortable clothes (no dresses or overalls) and walking shoes, tennis shoes preferred (No heels or open toe shoes are allowed). Do NOT wear cologne, perfume, aftershave, or lotions (deodorant is  allowed). If these instructions are not followed, your test will have to be rescheduled.  Please report to [address] for your test.  If you have questions or concerns about your appointment, you can call the [facility] at [phone].  If you cannot keep your appointment, please provide 24 hours notification to the Nuclear Lab, to avoid a possible $50 charge to your account.   Follow-Up: At [facility], you and your health needs are our priority.  As part of our continuing mission to provide you with exceptional heart care, our providers are all part of one team.  This team includes your primary Cardiologist (physician) and Advanced Practice Providers or APPs (Physician Assistants and Nurse Practitioners) who all work together to provide you with the care you need, when you need it.  Your next appointment:   Follow up to be determined after testing.  Provider:   Bertha Broad, MD  We recommend signing up for the patient portal called "MyChart".  Sign up information is provided on this After Visit Summary.  MyChart is used to connect with patients for Virtual Visits (Telemedicine).  Patients are able to view lab/test results, encounter notes, upcoming appointments, etc.  Non-urgent messages can be sent to your provider as well.   To learn more about what you can do with MyChart, go to [email or web address].   Other Instructions None

## 2024-01-27 NOTE — Progress Notes (Signed)
 Cardiology Consultation:    Date:  01/27/2024   ID:  Erin Sloan, DOB [birth year], MRN [id number]  PCP:  Erin Everts, PA-C  Cardiologist:  Erin Evans Katelynd Blauvelt, MD   Referring MD: Erin Sloan   No chief complaint on file.    ASSESSMENT AND PLAN:   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 Coronary atherosclerosis and aortic atherosclerosis noted on lung cancer screening study recently March 2025, referred for further evaluation. Describes symptoms of chest discomfort which are atypical could be related to gastroesophageal reflux disease but cannot entirely exclude cardiac causes.  Problem List Items Addressed This Visit     Primary hypertension   Well-controlled. Continue current medication losartan  25 mg once daily and metoprolol  succinate 25 mg once daily. Target below 130/80 mmHg.      Atypical chest pain   Atypical chest pain.  With ongoing symptoms as reviewed above. Proceed with functional testing with stress test with nuclear imaging. If unable to attain target heart rate, switch to Lexiscan stress test with nuclear imaging.       Relevant Orders   EKG 12-Lead (Completed)   MYOCARDIAL PERFUSION IMAGING   Shortness of breath on exertion   Likely multifactorial setting of COPD and longstanding history of smoking.  From cardiac standpoint we will evaluate for any ischemia with stress test as reviewed above.      Coronary atherosclerosis - Primary   Coronary atherosclerosis on lung cancer screening.  No prior history of CAD, CHF, MI. He does have significant cardiovascular risk factors with ongoing smoking, recent history of CVA.  Now with atypical symptoms of chest discomfort and shortness of breath on exertion which could be  multifactorial related to deconditioning, underlying COPD but cannot entirely exclude cardiac arrhythmias.  In the setting we will proceed with functional testing with treadmill EKG stress test with nuclear imaging.   Continue with current risk factor reduction with ongoing dual antiplatelet therapy with aspirin  and Plavix  in the setting of his recent CVA. Continue with atorvastatin  40 mg once daily.       Relevant Orders   EKG 12-Lead (Completed)   MYOCARDIAL PERFUSION IMAGING    Return to clinic based on test results.  History of Present Illness:    Erin Sloan is a 63 y.o. female who is being seen today for the evaluation of atherosclerosis at the request of Saguier, Gaylin Ke, PA-C.  History of smoking, emphysema, now noted with coronary and aortic atherosclerosis on lung cancer screening study from December 06, 2023.  Reports history of CVA x 2 last episode August 2023 with mild residual right upper and lower extremity weakness and sensory deficits.    pleasant woman here for the visit today accompanied by her daughter. No regular activity or exercise due to her concern about imbalance.  Supposed to use cane for balance but rarely uses it.  Per daughter she is relatively sedentary.  She does report epigastric and retrosternal chest discomfort which she attributes to heartburn, pain unrelated to the timing of food, time of the day or position.  Denies any abdominal paroxysmal nocturnal dyspnea, pedal edema.  Denies any syncopal episode. Diet had a mechanical fall about a month ago.  Continues to smoke a pack of cigarettes a day.  Mentions of a difficult  habit to quit as she has been smoking since her teenage years. No illicit drug use.  Denies any blood in urine or stool. Good compliance with her medications.    EKG in clinic today shows sinus rhythm heart rate 77/min, PR interval 168 ms, QTc 434 ms.  Normal QRS axis.  Lung cancer CT chest from 11-28-2023 noted aortic and  coronary atherosclerosis notably involving left anterior descending artery.  Echocardiogram report from April 18, 2027 2023 at [facility] noted LVEF 50 to 55%, no evidence of atrial fibrillation but on agitated saline study.  Lipid panel from 03-22-2023 total cholesterol 181, triglycerides 96, HDL 92, LDL 69. The last CMP available is from 03/22/2023.  Reassuring normal electrolytes and renal function and transaminases.  Past Medical History:  Diagnosis Date   Acute allergic reaction    Acute bronchitis 08/19/2018   Alcohol use 08/20/2018   Allergic reaction 08/20/2018   COPD (chronic obstructive pulmonary disease) (HCC)    CVA (cerebrovascular accident) (HCC) 11/18/2019   Depression    DYSURIA 02/14/2010   Qualifier: Diagnosis of   By: Tracy Friedlander         Head ache    Hypokalemia 08/19/2018   Primary hypertension 05/10/2022   Pulmonary nodules 10/10/2018   SINUSITIS - ACUTE-NOS 12/12/2009   Qualifier: Diagnosis of   By: Tracy Friedlander         Tobacco use 08/19/2018   WRIST PAIN, BILATERAL 02/14/2010   Qualifier: Diagnosis of   By: Tracy Friedlander          Past Surgical History:  Procedure Laterality Date   CARPAL TUNNEL RELEASE     CESAREAN SECTION     1985   CESAREAN SECTION     1987    Current Medications: Current Meds  Medication Sig   albuterol  (VENTOLIN  HFA) 108 (90 Base) MCG/ACT inhaler TAKE 2 PUFFS BY MOUTH EVERY 6 HOURS AS NEEDED FOR WHEEZE OR SHORTNESS OF BREATH   aspirin  325 MG tablet Take 325 mg by mouth daily.   atorvastatin  (LIPITOR ) 40 MG tablet TAKE 1 TABLET BY MOUTH EVERY DAY   benzonatate  (TESSALON ) 100 MG capsule Take 1 capsule (100 mg total) by mouth 3 (three) times daily as needed for cough.   clopidogrel  (PLAVIX ) 75 MG tablet TAKE 1 TABLET BY MOUTH EVERY DAY   doxycycline  (VIBRA -TABS) 100 MG tablet Take 1 tablet (100 mg total) by mouth 2 (two) times daily.   ipratropium-albuterol  (DUONEB) 0.5-2.5 (3) MG/3ML SOLN Take 3 mLs by  nebulization every 6 (six) hours as needed (wheezing, chest tightness).   levofloxacin  (LEVAQUIN ) 500 MG tablet Take 1 tablet (500 mg total) by mouth daily.   losartan  (COZAAR ) 25 MG tablet TAKE 1 TABLET (25 MG TOTAL) BY MOUTH DAILY.   methylPREDNISolone  (MEDROL ) 4 MG tablet 3 tab po day 1 , 2 day po day 2 and 1 tab po day 3   metoprolol  succinate (TOPROL -XL) 25 MG 24 hr tablet TAKE 1 TABLET (25 MG TOTAL) BY MOUTH DAILY.   omeprazole  (PRILOSEC) 40 MG capsule Take 1 capsule (40 mg total) by mouth daily.   ondansetron  (ZOFRAN ) 4 MG tablet TAKE 1 TABLET BY MOUTH EVERY 8 HOURS AS NEEDED FOR NAUSEA AND VOMITING   predniSONE  (DELTASONE ) 10 MG tablet Take 1 tablet (10 mg total) by mouth daily with breakfast.   predniSONE  (STERAPRED UNI-PAK 21 TAB) 10 MG (21) TBPK tablet Take 2 tabs with food for 5 days and then 1 tablet daily with  food for 5 days then stop.   umeclidinium-vilanterol (ANORO ELLIPTA ) 62.5-25 MCG/ACT AEPB INHALE 1 PUFF BY MOUTH EVERY DAY     Allergies:   Ibuprofen and Amoxicillin   Social History   Socioeconomic History   Marital status: Widowed    Spouse name: Not on file   Number of children: Not on file   Years of education: Not on file   Highest education level: 12th grade  Occupational History   Not on file  Tobacco Use   Smoking status: Every Day    Current packs/day: 1.50    Average packs/day: 1.5 packs/day for 48.0 years (72.0 ttl pk-yrs)    Types: Cigarettes   Smokeless tobacco: Never   Tobacco comments:    Pt states she smokes about 4-5 a day at the most as of 11/05/2022 Genella Kendall, CMA  Substance and Sexual Activity   Alcohol use: Yes    Comment: socially. moderate now.  years ago drank heavier on weekends.   Drug use: No   Sexual activity: Not Currently  Other Topics Concern   Not on file  Social History Narrative   Not on file   Social Drivers of Health   Financial Resource Strain: Low Risk  (07/24/2023)   Overall Financial Resource Strain  (CARDIA)    Difficulty of Paying Living Expenses: Not hard at all  Food Insecurity: No Food Insecurity (07/24/2023)   Hunger Vital Sign    Worried About Running Out of Food in the Last Year: Never Erin    Ran Out of Food in the Last Year: Never Erin  Transportation Needs: No Transportation Needs (07/24/2023)   PRAPARE - Administrator, Civil Service (Medical): No    Lack of Transportation (Non-Medical): No  Physical Activity: Unknown (07/24/2023)   Exercise Vital Sign    Days of Exercise per Week: 0 days    Minutes of Exercise per Session: Not on file  Recent Concern: Physical Activity - Inactive (07/24/2023)   Exercise Vital Sign    Days of Exercise per Week: 0 days    Minutes of Exercise per Session: 60 min  Stress: Stress Concern Present (12/20/2022)   [employer] of Occupational Health - Occupational Stress Questionnaire    Feeling of Stress : Very much  Social Connections: Socially Isolated (07/24/2023)   Social Connection and Isolation Panel [NHANES]    Frequency of Communication with Friends and Family: More than three times a week    Frequency of Social Gatherings with Friends and Family: More than three times a week    Attends Religious Services: Never    Database administrator or Organizations: No    Attends Banker Meetings: Not on file    Marital Status: Widowed     Family History: The patient's family history includes Heart attack in her father; Heart disease in her father; Pancreatic cancer in her mother. ROS:   Please see the history of present illness.    All 14 point review of systems negative except as described per history of present illness.  EKGs/Labs/Other Studies Reviewed:    The following studies were reviewed today:   EKG:  EKG Interpretation Date/Time:  Monday Jan 27 2024 08:35:49 EDT Ventricular Rate:  77 PR Interval:  168 QRS Duration:  90 QT Interval:  384 QTC Calculation: 434 R Axis:   70  Text  Interpretation: Normal sinus rhythm Right atrial enlargement Borderline ECG When compared with ECG of 18-Nov-2019 18:44, PREVIOUS ECG IS PRESENT  Confirmed by Bertha Broad reddy [phone]) on 01/27/2024 9:12:14 AM    Recent Labs: 03/22/2023: ALT 29; BUN 17; Creatinine, Ser 0.53; Potassium 4.2; Sodium 135  Recent Lipid Panel    Component Value Date/Time   CHOL 181 03/22/2023 1012   TRIG 96.0 03/22/2023 1012   HDL 92.80 03/22/2023 1012   CHOLHDL 2 03/22/2023 1012   VLDL 19.2 03/22/2023 1012   LDLCALC 69 03/22/2023 1012    Physical Exam:    VS:  BP 116/72   Pulse 77   Ht 5\' 4"  (1.626 m)   Wt 122 lb 9.6 oz (55.6 kg)   SpO2 91%   BMI 21.04 kg/m     Wt Readings from Last 3 Encounters:  01/27/24 122 lb 9.6 oz (55.6 kg)  07/31/23 123 lb 14.4 oz (56.2 kg)  07/25/23 121 lb (54.9 kg)     GENERAL:  Well nourished, well developed in no acute distress NECK: No JVD; No carotid bruits CARDIAC: RRR, S1 and S2 present, no murmurs, no rubs, no gallops CHEST:  Clear to auscultation without rales, wheezing or rhonchi  Extremities: No pitting pedal edema. Pulses bilaterally symmetric with radial 2+ and dorsalis pedis 2+ NEUROLOGIC:  Alert and oriented x 3  Medication Adjustments/Labs and Tests Ordered: Current medicines are reviewed at length with the patient today.  Concerns regarding medicines are outlined above.  Orders Placed This Encounter  Procedures   MYOCARDIAL PERFUSION IMAGING   EKG 12-Lead   No orders of the defined types were placed in this encounter.   Signed, Denetra Formoso reddy Shir Bergman, MD, MPH, [facility]. 01/27/2024 9:35 AM    [facility]

## 2024-01-27 NOTE — Assessment & Plan Note (Signed)
 Atypical chest pain.  With ongoing symptoms as reviewed above. Proceed with functional testing with stress test with nuclear imaging. If unable to attain target heart rate, switch to Lexiscan stress test with nuclear imaging.

## 2024-01-27 NOTE — Assessment & Plan Note (Signed)
 Well-controlled. Continue current medication losartan  25 mg once daily and metoprolol  succinate 25 mg once daily. Target below 130/80 mmHg.

## 2024-01-27 NOTE — Assessment & Plan Note (Addendum)
 Coronary atherosclerosis on lung cancer screening.  No prior history of CAD, CHF, MI. He does have significant cardiovascular risk factors with ongoing smoking, recent history of CVA.  Now with atypical symptoms of chest discomfort and shortness of breath on exertion which could be multifactorial related to deconditioning, underlying COPD but cannot entirely exclude cardiac arrhythmias.  In the setting we will proceed with functional testing with treadmill EKG stress test with nuclear imaging.   Continue with current risk factor reduction with ongoing dual antiplatelet therapy with aspirin  and Plavix  in the setting of his recent CVA. Continue with atorvastatin  40 mg once daily.

## 2024-01-28 ENCOUNTER — Telehealth: Payer: Self-pay | Admitting: *Deleted

## 2024-01-28 NOTE — Telephone Encounter (Signed)
 Pt given instructions for MPI study . Pt told to hold metoprolol  for 24 hrs.

## 2024-01-30 ENCOUNTER — Other Ambulatory Visit: Payer: Self-pay | Admitting: Medical

## 2024-02-05 ENCOUNTER — Ambulatory Visit

## 2024-02-05 DIAGNOSIS — R0789 Other chest pain: Secondary | ICD-10-CM | POA: Diagnosis not present

## 2024-02-05 DIAGNOSIS — I2510 Atherosclerotic heart disease of native coronary artery without angina pectoris: Secondary | ICD-10-CM | POA: Insufficient documentation

## 2024-02-05 MED ORDER — TECHNETIUM TC 99M TETROFOSMIN IV KIT
10.8000 | PACK | Freq: Once | INTRAVENOUS | Status: AC | PRN
  Administered 2024-02-05: 10.8 via INTRAVENOUS

## 2024-02-05 MED ORDER — TECHNETIUM TC 99M TETROFOSMIN IV KIT
29.3000 | PACK | Freq: Once | INTRAVENOUS | Status: AC | PRN
  Administered 2024-02-05: 29.3 via INTRAVENOUS

## 2024-02-05 MED ORDER — REGADENOSON 0.4 MG/5ML IV SOLN
0.4000 mg | Freq: Once | INTRAVENOUS | Status: AC
  Administered 2024-02-05: 0.4 mg via INTRAVENOUS

## 2024-02-06 ENCOUNTER — Ambulatory Visit: Payer: Self-pay

## 2024-02-06 LAB — MYOCARDIAL PERFUSION IMAGING
LV dias vol: 60 mL (ref 46–106)
LV sys vol: 15 mL
Nuc Stress EF: 74 %
Peak HR: 104 {beats}/min
Rest HR: 76 {beats}/min
Rest Nuclear Isotope Dose: 10.8 mCi
SDS: 1
SRS: 2
SSS: 3
ST Depression (mm): 0 mm
Stress Nuclear Isotope Dose: 29.3 mCi
TID: 0.96

## 2024-02-13 ENCOUNTER — Encounter (HOSPITAL_BASED_OUTPATIENT_CLINIC_OR_DEPARTMENT_OTHER): Payer: Self-pay | Admitting: Adult Health

## 2024-02-13 ENCOUNTER — Ambulatory Visit (HOSPITAL_BASED_OUTPATIENT_CLINIC_OR_DEPARTMENT_OTHER): Admitting: Adult Health

## 2024-02-13 VITALS — BP 135/101 | HR 72 | Ht 64.0 in | Wt 122.0 lb

## 2024-02-13 DIAGNOSIS — F17210 Nicotine dependence, cigarettes, uncomplicated: Secondary | ICD-10-CM

## 2024-02-13 DIAGNOSIS — Z720 Tobacco use: Secondary | ICD-10-CM

## 2024-02-13 DIAGNOSIS — J432 Centrilobular emphysema: Secondary | ICD-10-CM | POA: Diagnosis not present

## 2024-02-13 DIAGNOSIS — J441 Chronic obstructive pulmonary disease with (acute) exacerbation: Secondary | ICD-10-CM

## 2024-02-13 MED ORDER — IPRATROPIUM-ALBUTEROL 0.5-2.5 (3) MG/3ML IN SOLN: 3.0000 mL | Freq: Four times a day (QID) | RESPIRATORY_TRACT | 5 refills | Status: AC | PRN

## 2024-02-13 MED ORDER — UMECLIDINIUM-VILANTEROL 62.5-25 MCG/ACT IN AEPB: INHALATION_SPRAY | RESPIRATORY_TRACT | 11 refills | Status: DC

## 2024-02-13 MED ORDER — ALBUTEROL SULFATE HFA 108 (90 BASE) MCG/ACT IN AERS: INHALATION_SPRAY | RESPIRATORY_TRACT | 5 refills | Status: DC

## 2024-02-13 NOTE — Patient Instructions (Addendum)
 Continue on Anoro 1 puff daily.  Albuterol  inhaler or Duoneb As needed   Work on not smoking.  Continue with yearly CT chest scan -Lung cancer screening program.  Activity as tolerated.  Follow up with Dr. Villa Greaser  in 1 year and As needed

## 2024-02-13 NOTE — Progress Notes (Signed)
 @Patient  ID: Erin Sloan, female    DOB: Jun 22, 1961, 63 y.o.   MRN: [id number]  Chief Complaint  Patient presents with   Follow-up    COPD    Referring provider: SaguierSlater Duncan  HPI: 63 year old female active smoker followed for COPD with emphysema and pulmonary nodules Participates in the lung cancer screening program Medical history significant for stroke  TEST/EVENTS :  Spiro 10/2022 ratio 65 but FEV1 & FVC normal Spirometry 09/2018 ratio 71, FEV1 78%, FVC 84%   LDCT 11/2022 RADS 2  LDCT chest 11/2020 emphysema, coronary calcification CT chest 1/ 2021 showed emphysema.  1-2 mm right upper lobe nodule peripherally stable CT chest angiogram 08/2018 moderate emphysema, 2 to 3 mm nodules  02/13/2024 Follow up : COPD and Lung nodules  Patient returns for a 15-month follow-up.  Patient has COPD with emphysema.  She says overall she is doing okay.  She remains on Anoro daily.  She denies any flare of cough or wheezing, hemoptysis or chest pain.  She is still smoking.  We discussed smoking cessation.  Participates in the lung cancer CT screening program. Low-dose CT chest December 06, 2023 showed emphysema.  Pulmonary nodules measuring up to 3.1 mm stable.  No new pulmonary nodules.  Lung RADS 2.  Atherosclerosis noted. Patient was referred to cardiology, underwent cardiac stress test Feb 05, 2024 that was considered a low risk study.  Allergies  Allergen Reactions   Ibuprofen Swelling   Amoxicillin Hives    Has patient had a PCN reaction causing immediate rash, facial/tongue/throat swelling, SOB or lightheadedness with hypotension: No Has patient had a PCN reaction causing severe rash involving mucus membranes or skin necrosis: No Has patient had a PCN reaction that required hospitalization: No Has patient had a PCN reaction occurring within the last 10 years: No If all of the above answers are "NO", then may proceed with Cephalosporin use.\    Immunization History   Administered Date(s) Administered   Influenza,inj,Quad PF,6+ Mos 06/28/2018, 10/12/2019    Past Medical History:  Diagnosis Date   Acute allergic reaction    Acute bronchitis 08/19/2018   Alcohol use 08/20/2018   Allergic reaction 08/20/2018   COPD (chronic obstructive pulmonary disease) (HCC)    CVA (cerebrovascular accident) (HCC) 11/18/2019   Depression    DYSURIA 02/14/2010   Qualifier: Diagnosis of   By: Tracy Friedlander         Head ache    Hypokalemia 08/19/2018   Primary hypertension 05/10/2022   Pulmonary nodules 10/10/2018   SINUSITIS - ACUTE-NOS 12/12/2009   Qualifier: Diagnosis of   By: Tracy Friedlander         Tobacco use 08/19/2018   WRIST PAIN, BILATERAL 02/14/2010   Qualifier: Diagnosis of   By: Tracy Friedlander          Tobacco History: Social History   Tobacco Use  Smoking Status Every Day   Current packs/day: 1.50   Average packs/day: 1.5 packs/day for 48.0 years (72.0 ttl pk-yrs)   Types: Cigarettes  Smokeless Tobacco Never  Tobacco Comments   Pt states she smokes about 4-5 a day at the most as of 11/05/2022 Genella Kendall, CMA   Ready to quit: Not Answered Counseling given: Not Answered Tobacco comments: Pt states she smokes about 4-5 a day at the most as of 11/05/2022 Genella Kendall, CMA   Outpatient Medications Prior to Visit  Medication Sig Dispense Refill   albuterol  (VENTOLIN  HFA) 108 (90 Base) MCG/ACT  inhaler TAKE 2 PUFFS BY MOUTH EVERY 6 HOURS AS NEEDED FOR WHEEZE OR SHORTNESS OF BREATH 18 each 3   aspirin  325 MG tablet Take 325 mg by mouth daily.     atorvastatin  (LIPITOR ) 40 MG tablet TAKE 1 TABLET BY MOUTH EVERY DAY 90 tablet 3   benzonatate  (TESSALON ) 100 MG capsule Take 1 capsule (100 mg total) by mouth 3 (three) times daily as needed for cough. 30 capsule 0   clopidogrel  (PLAVIX ) 75 MG tablet TAKE 1 TABLET BY MOUTH EVERY DAY 90 tablet 3   doxycycline  (VIBRA -TABS) 100 MG tablet Take 1 tablet (100 mg total) by mouth 2 (two) times  daily. 20 tablet 0   ipratropium-albuterol  (DUONEB) 0.5-2.5 (3) MG/3ML SOLN Take 3 mLs by nebulization every 6 (six) hours as needed (wheezing, chest tightness). 120 mL 0   levofloxacin  (LEVAQUIN ) 500 MG tablet Take 1 tablet (500 mg total) by mouth daily. 5 tablet 0   losartan  (COZAAR ) 25 MG tablet TAKE 1 TABLET (25 MG TOTAL) BY MOUTH DAILY. 90 tablet 3   methylPREDNISolone  (MEDROL ) 4 MG tablet 3 tab po day 1 , 2 day po day 2 and 1 tab po day 3 6 tablet 0   metoprolol  succinate (TOPROL -XL) 25 MG 24 hr tablet TAKE 1 TABLET (25 MG TOTAL) BY MOUTH DAILY. 90 tablet 3   omeprazole  (PRILOSEC) 40 MG capsule TAKE 1 CAPSULE (40 MG TOTAL) BY MOUTH DAILY. 90 capsule 3   ondansetron  (ZOFRAN ) 4 MG tablet TAKE 1 TABLET BY MOUTH EVERY 8 HOURS AS NEEDED FOR NAUSEA AND VOMITING 18 tablet 1   umeclidinium-vilanterol (ANORO ELLIPTA ) 62.5-25 MCG/ACT AEPB INHALE 1 PUFF BY MOUTH EVERY DAY 60 each 11   predniSONE  (DELTASONE ) 10 MG tablet Take 1 tablet (10 mg total) by mouth daily with breakfast. (Patient not taking: Reported on 02/13/2024) 20 tablet 0   predniSONE  (STERAPRED UNI-PAK 21 TAB) 10 MG (21) TBPK tablet Take 2 tabs with food for 5 days and then 1 tablet daily with food for 5 days then stop. (Patient not taking: Reported on 02/13/2024) 21 tablet 0   No facility-administered medications prior to visit.     Review of Systems:   Constitutional:   No  weight loss, night sweats,  Fevers, chills, fatigue, or  lassitude.  HEENT:   No headaches,  Difficulty swallowing,  Tooth/dental problems, or  Sore throat,                No sneezing, itching, ear ache, nasal congestion, post nasal drip,   CV:  No chest pain,  Orthopnea, PND, swelling in lower extremities, anasarca, dizziness, palpitations, syncope.   GI  No heartburn, indigestion, abdominal pain, nausea, vomiting, diarrhea, change in bowel habits, loss of appetite, bloody stools.   Resp:   No chest wall deformity  Skin: no rash or lesions.  GU: no dysuria,  change in color of urine, no urgency or frequency.  No flank pain, no hematuria   MS:  No joint pain or swelling.  No decreased range of motion.  No back pain.    Physical Exam  BP (!) 135/101   Pulse 72   Ht 5\' 4"  (1.626 m)   Wt 122 lb (55.3 kg)   SpO2 100%   BMI 20.94 kg/m   GEN: A/Ox3; pleasant , NAD, well nourished    HEENT:  [place]/AT,  NOSE-clear, THROAT-clear, no lesions, no postnasal drip or exudate noted.   NECK:  Supple w/ fair ROM; no JVD; normal carotid impulses  w/o bruits; no thyromegaly or nodules palpated; no lymphadenopathy.    RESP  Clear  P & A; w/o, wheezes/ rales/ or rhonchi. no accessory muscle use, no dullness to percussion  CARD:  RRR, no m/r/g, no peripheral edema, pulses intact, no cyanosis or clubbing.  GI:   Soft & nt; nml bowel sounds; no organomegaly or masses detected.   Musco: Warm bil, no deformities or joint swelling noted.   Neuro: alert, no focal deficits noted.    Skin: Warm, no lesions or rashes    Lab Results:  CBC   BNP No results found for: "BNP"  ProBNP No results found for: "PROBNP"  Imaging: MYOCARDIAL PERFUSION IMAGING Result Date: 02/06/2024   The study is normal. The study is low risk.   No ST deviation was noted.   Left ventricular function is normal. Nuclear stress EF: 74%. The left ventricular ejection fraction is hyperdynamic (>65%). End diastolic cavity size is normal.    regadenoson  (LEXISCAN ) injection SOLN 0.4 mg     Date Action Dose Route User   02/05/2024 1250 Given 0.4 mg Intravenous Varner, Gina E      technetium tetrofosmin  (TC-MYOVIEW ) injection 10.8 millicurie     Date Action Dose Route User   02/05/2024 1110 Contrast Given 10.8 millicurie Intravenous Varner, Gina E      technetium tetrofosmin  (TC-MYOVIEW ) injection 29.3 millicurie     Date Action Dose Route User   02/05/2024 1250 Contrast Given 29.3 millicurie Intravenous Cesar Collins E          Latest Ref Rng & Units 11/02/2022   10:36  AM  PFT Results  FVC-Pre L 3.52   FVC-Predicted Pre % 113   FVC-Post L 3.57   FVC-Predicted Post % 115   Pre FEV1/FVC % % 65   Post FEV1/FCV % % 68   FEV1-Pre L 2.28   FEV1-Predicted Pre % 95   FEV1-Post L 2.42     No results found for: "NITRICOXIDE"      Assessment & Plan:   No problem-specific Assessment & Plan notes found for this encounter.     Roena Clark, NP 02/13/2024

## 2024-02-17 NOTE — Assessment & Plan Note (Signed)
 Appears stable.  Continue on Anoro daily.  Continue with yearly low-dose CT chest screening program.  Smoking cessation discussed  Plan  Patient Instructions  Continue on Anoro 1 puff daily.  Albuterol  inhaler or Duoneb As needed   Work on not smoking.  Continue with yearly CT chest scan -Lung cancer screening program.  Activity as tolerated.  Follow up with Dr. Villa Greaser  in 1 year and As needed

## 2024-02-17 NOTE — Assessment & Plan Note (Signed)
 Smoking cessation discussed

## 2024-04-03 ENCOUNTER — Other Ambulatory Visit: Payer: Self-pay | Admitting: Medical

## 2024-05-12 ENCOUNTER — Other Ambulatory Visit: Payer: Self-pay | Admitting: Medical

## 2024-05-14 ENCOUNTER — Telehealth (HOSPITAL_BASED_OUTPATIENT_CLINIC_OR_DEPARTMENT_OTHER): Payer: Self-pay

## 2024-05-14 NOTE — Telephone Encounter (Unsigned)
 Copied from CRM [id number]. Topic: Clinical - Prescription Issue >> May 14, 2024  8:15 AM Benton O wrote: Reason for CRM: patient is calling because her medicaid ran out and she needs a refill umeclidinium-vilanterol (ANORO ELLIPTA ) 62.5-25 MCG/ACT AEPB but the co pay is high . Patient says she just need some help until the medicaid kicks back in. Patient asking for samples until then . [phone]

## 2024-05-14 NOTE — Telephone Encounter (Signed)
 She can use Duoneb 2-3 times a day , this will be the cheapest.   I looked up Spiriva Handihaler if she wants to change to this inhaler in place of ANORO . I found it on [facility] for ~$$76  Price with [employer], email, or text this coupon to yourself [facility] Exclusive manufacturer discount $ 76.32 Retail price: $381.59 Show this coupon at the pharmacy. BIN [id number] PCN [id number] Member ID [id number]

## 2024-05-15 NOTE — Telephone Encounter (Signed)
 Spoke with patient regarding prior message. Patient stated she is waiting ofr her insurance to be approved and she is going to do her Duoneb and use her albuterol  inhaler. Patient stated she will not be able to pay the price of the Sprivia inhaler at this time . Advised patient to contact our office back once she gets approved for her insurance. Patient's voice was understanding.Nothing else further needed.

## 2024-06-29 ENCOUNTER — Other Ambulatory Visit: Payer: Self-pay | Admitting: Medical

## 2024-07-06 ENCOUNTER — Telehealth: Payer: Self-pay

## 2024-07-06 ENCOUNTER — Other Ambulatory Visit: Payer: Self-pay

## 2024-07-06 MED ORDER — CLOPIDOGREL BISULFATE 75 MG PO TABS: 75.0000 mg | ORAL_TABLET | Freq: Every day | ORAL | 3 refills | Status: AC

## 2024-07-06 NOTE — Telephone Encounter (Signed)
 Called pt and she stated she needed her plavix  sent by [facility]. I changed her pharmacy in the system and sent over her rx for her  Copied from CRM #[id number]. Topic: Clinical - Prescription Issue >> Jul 06, 2024 12:57 PM Rosina BIRCH wrote: Reason for CRM: [facility] called [facility] but they have not received it yet. She was told to contact her provider about the refill. I told the patient she [facility]  but she want to start having [facility] it to her and they do not have the medication . Patient has been out of medication for two days [phone]

## 2024-07-08 ENCOUNTER — Other Ambulatory Visit: Payer: Self-pay

## 2024-07-08 ENCOUNTER — Telehealth: Payer: Self-pay

## 2024-07-08 MED ORDER — METOPROLOL SUCCINATE ER 25 MG PO TB24: 25.0000 mg | ORAL_TABLET | Freq: Every day | ORAL | 3 refills | Status: AC

## 2024-07-08 MED ORDER — ATORVASTATIN CALCIUM 40 MG PO TABS: 40.0000 mg | ORAL_TABLET | Freq: Every day | ORAL | 3 refills | Status: AC

## 2024-07-08 MED ORDER — UMECLIDINIUM-VILANTEROL 62.5-25 MCG/ACT IN AEPB: INHALATION_SPRAY | RESPIRATORY_TRACT | 3 refills | Status: AC

## 2024-07-08 NOTE — Telephone Encounter (Signed)
 Spoke to pt and she wants all her rxs to go through [facility]  Only antibiotics through [facility]

## 2024-07-15 ENCOUNTER — Other Ambulatory Visit (HOSPITAL_BASED_OUTPATIENT_CLINIC_OR_DEPARTMENT_OTHER): Payer: Self-pay

## 2024-07-15 MED ORDER — ALBUTEROL SULFATE HFA 108 (90 BASE) MCG/ACT IN AERS: INHALATION_SPRAY | RESPIRATORY_TRACT | 5 refills | Status: AC

## 2024-07-24 ENCOUNTER — Other Ambulatory Visit: Payer: Self-pay | Admitting: Medical

## 2024-07-28 ENCOUNTER — Encounter: Payer: Self-pay | Admitting: Medical

## 2024-07-28 ENCOUNTER — Ambulatory Visit (INDEPENDENT_AMBULATORY_CARE_PROVIDER_SITE_OTHER): Admitting: Medical

## 2024-07-28 ENCOUNTER — Other Ambulatory Visit: Payer: Self-pay

## 2024-07-28 VITALS — BP 135/70 | HR 81 | Temp 98.7°F | Resp 14 | Ht 64.0 in | Wt 127.6 lb

## 2024-07-28 DIAGNOSIS — J209 Acute bronchitis, unspecified: Secondary | ICD-10-CM

## 2024-07-28 DIAGNOSIS — R059 Cough, unspecified: Secondary | ICD-10-CM

## 2024-07-28 DIAGNOSIS — I10 Essential (primary) hypertension: Secondary | ICD-10-CM | POA: Diagnosis not present

## 2024-07-28 DIAGNOSIS — J3489 Other specified disorders of nose and nasal sinuses: Secondary | ICD-10-CM | POA: Diagnosis not present

## 2024-07-28 MED ORDER — DOXYCYCLINE HYCLATE 100 MG PO TABS: 100.0000 mg | ORAL_TABLET | Freq: Two times a day (BID) | ORAL | 0 refills | Status: AC

## 2024-07-28 MED ORDER — OMEPRAZOLE 40 MG PO CPDR: 40.0000 mg | DELAYED_RELEASE_CAPSULE | Freq: Every day | ORAL | 3 refills | Status: AC

## 2024-07-28 MED ORDER — BENZONATATE 100 MG PO CAPS: 100.0000 mg | ORAL_CAPSULE | Freq: Three times a day (TID) | ORAL | 0 refills | Status: AC | PRN

## 2024-07-28 MED ORDER — LOSARTAN POTASSIUM 25 MG PO TABS: 25.0000 mg | ORAL_TABLET | Freq: Every day | ORAL | 3 refills | Status: AC

## 2024-07-28 NOTE — Progress Notes (Signed)
 Subjective:    Patient ID: Erin Sloan, female    DOB: 1961-08-31, 63 y.o.   MRN: [id number]  HPI  Erin Sloan is a 63 year old female with COPD who presents with sinus congestion and cough.  She has experienced sinus congestion and a persistent cough for over a week, with green nasal discharge occasionally tinged with blood. She wakes up coughing and has frontal sinus pain and headaches. There are no fevers, chills, or sweats. Her grandson was recently ill, and she often contracts illnesses from family members.  She experiences sinus infections once or twice a year. Coughing sometimes produces mucus and wheezing, with nocturnal coughing fits concerning her daughter.  She is a former smoker, now smoking less than half a pack a day, down from over a pack a day. She uses an Ellipta inhaler for COPD and has had a CT lung cancer screening.  She is anxious about her symptoms due to a past episode of pneumonia. She has been prescribed doxycycline  previously and is cautious with antibiotics due to a reaction to amoxicillin. Benzonatate  was effective for her cough in the pas         Review of Systems  Constitutional:  Negative for chills, fatigue and fever.  HENT:  Positive for congestion and sinus pressure. Negative for postnasal drip, sinus pain and sneezing.   Respiratory:  Positive for cough. Negative for wheezing.   Cardiovascular:  Negative for chest pain and palpitations.  Gastrointestinal:  Negative for abdominal pain.  Genitourinary:  Negative for dysuria.  Musculoskeletal:  Negative for back pain.  Neurological:  Negative for dizziness, seizures and weakness.  Hematological:  Negative for adenopathy.  Psychiatric/Behavioral:  Negative for behavioral problems and decreased concentration.     Past Medical History:  Diagnosis Date   Acute allergic reaction    Acute bronchitis 08/19/2018   Alcohol use 08/20/2018   Allergic reaction 08/20/2018   COPD (chronic  obstructive pulmonary disease) (HCC)    CVA (cerebrovascular accident) (HCC) 11/18/2019   Depression    DYSURIA 02/14/2010   Qualifier: Diagnosis of   By: Antonio ROSALEA Rockers         Head ache    Hypokalemia 08/19/2018   Primary hypertension 05/10/2022   Pulmonary nodules 10/10/2018   SINUSITIS - ACUTE-NOS 12/12/2009   Qualifier: Diagnosis of   By: Antonio ROSALEA Rockers         Tobacco use 08/19/2018   WRIST PAIN, BILATERAL 02/14/2010   Qualifier: Diagnosis of   By: Antonio ROSALEA Rockers           Social History   Socioeconomic History   Marital status: Widowed    Spouse name: Not on file   Number of children: Not on file   Years of education: Not on file   Highest education level: 12th grade  Occupational History   Not on file  Tobacco Use   Smoking status: Every Day    Current packs/day: 65    Average packs/day: 1.5 packs/day for 65 years (72.0 ttl pk-yrs)    Types: Cigarettes   Smokeless tobacco: Never   Tobacco comments:    Pt states she smokes about 4-5 a day at the most as of 11/05/2022 Curtiss Engman, CMA  Substance and Sexual Activity   Alcohol use: Yes    Comment: socially. moderate now.  years ago drank heavier on weekends.   Drug use: No   Sexual activity: Not Currently  Other Topics Concern   Not on  file  Social History Narrative   Not on file   Social Drivers of Health   Financial Resource Strain: High Risk (07/27/2024)   Overall Financial Resource Strain (CARDIA)    Difficulty of Paying Living Expenses: Hard  Food Insecurity: Food Insecurity Present (07/27/2024)   Hunger Vital Sign    Worried About Running Out of Food in the Last Year: Often true    Ran Out of Food in the Last Year: Often true  Transportation Needs: No Transportation Needs (07/27/2024)   PRAPARE - Administrator, Civil Service (Medical): No    Lack of Transportation (Non-Medical): No  Physical Activity: Insufficiently Active (07/27/2024)   Exercise Vital Sign    Days of  Exercise per Week: 1 day    Minutes of Exercise per Session: 10 min  Stress: Stress Concern Present (07/27/2024)   [employer] of Occupational Health - Occupational Stress Questionnaire    Feeling of Stress: Very much  Social Connections: Socially Isolated (07/27/2024)   Social Connection and Isolation Panel    Frequency of Communication with Friends and Family: More than three times a week    Frequency of Social Gatherings with Friends and Family: Never    Attends Religious Services: Never    Database Administrator or Organizations: No    Attends Engineer, Structural: Not on file    Marital Status: Widowed  Catering Manager Violence: Not on file    Past Surgical History:  Procedure Laterality Date   CARPAL TUNNEL RELEASE     CESAREAN SECTION     1985   CESAREAN SECTION     1987    Family History  Problem Relation Age of Onset   Pancreatic cancer Mother    Heart disease Father    Heart attack Father     Allergies  Allergen Reactions   Ibuprofen Swelling   Amoxicillin Hives    Has patient had a PCN reaction causing immediate rash, facial/tongue/throat swelling, SOB or lightheadedness with hypotension: No Has patient had a PCN reaction causing severe rash involving mucus membranes or skin necrosis: No Has patient had a PCN reaction that required hospitalization: No Has patient had a PCN reaction occurring within the last 10 years: No If all of the above answers are NO, then may proceed with Cephalosporin use.\    Current Outpatient Medications on File Prior to Visit  Medication Sig Dispense Refill   albuterol  (VENTOLIN  HFA) 108 (90 Base) MCG/ACT inhaler TAKE 2 PUFFS BY MOUTH EVERY 6 HOURS AS NEEDED FOR WHEEZE OR SHORTNESS OF BREATH 1 each 5   aspirin  325 MG tablet Take 325 mg by mouth daily.     atorvastatin  (LIPITOR ) 40 MG tablet Take 1 tablet (40 mg total) by mouth daily. 90 tablet 3   benzonatate  (TESSALON ) 100 MG capsule Take 1 capsule (100 mg total)  by mouth 3 (three) times daily as needed for cough. 30 capsule 0   clopidogrel  (PLAVIX ) 75 MG tablet Take 1 tablet (75 mg total) by mouth daily. 90 tablet 3   ipratropium-albuterol  (DUONEB) 0.5-2.5 (3) MG/3ML SOLN Take 3 mLs by nebulization every 6 (six) hours as needed (wheezing, chest tightness). 120 mL 5   metoprolol  succinate (TOPROL -XL) 25 MG 24 hr tablet Take 1 tablet (25 mg total) by mouth daily. 90 tablet 3   omeprazole  (PRILOSEC) 40 MG capsule TAKE 1 CAPSULE (40 MG TOTAL) BY MOUTH DAILY. 90 capsule 3   ondansetron  (ZOFRAN ) 4 MG tablet TAKE 1 TABLET BY  MOUTH EVERY 8 HOURS AS NEEDED FOR NAUSEA AND VOMITING 18 tablet 1   umeclidinium-vilanterol (ANORO ELLIPTA ) 62.5-25 MCG/ACT AEPB INHALE 1 PUFF BY MOUTH EVERY DAY 180 each 3   No current facility-administered medications on file prior to visit.    BP 135/70   Pulse 81   Temp 98.7 F (37.1 C) (Oral)   Resp 14   Ht 5' 4 (1.626 m)   Wt 127 lb 9.6 oz (57.9 kg)   SpO2 98%   BMI 21.90 kg/m        Objective:   Physical Exam   General- No acute distress. Pleasant patient. Neck- Full range of motion, no jvd Heart- regular rate and rhythm. Lungs- event unlabored shallow and expiratory wheeze Heent- frontal sinus pressure and pnd. Neurologic- CNII- XII grossly intact.  Lower ext- thin, symmetric callfs and negative homans signs.       A/P  Patient Instructions  Acute sinusitis with nasal congestion and frontal sinus pain Acute sinusitis with nasal congestion and frontal sinus pain, ongoing for over a week. Symptoms include green nasal discharge, occasional hemoptysis, and frontal sinus pain. Sinus infections occur once or twice a year. - Prescribed doxycycline  twice a day for ten days, ensuring it is taken with food to prevent stomach irritation. - Advised against using nasal spray due to previous adverse reactions.  Acute bronchitis with productive cough Acute bronchitis with productive cough, ongoing for over a week.  History of smoking and COPD increases risk for bronchitis. - Prescribed benzonatate  for cough management. - Recommended over-the-counter corcidin HBP for additional cough relief.  Chronic obstructive pulmonary disease (COPD) COPD with current use of Ellipta inhaler. Smoking history contributes to COPD. Recent exacerbation with productive cough and wheezing. - Continue current COPD management with Ellipta inhaler. - Encouraged smoking cessation efforts.  Essential hypertension Managed with losartan  25 mg. Home blood pressure readings are generally within target range, though slightly elevated in the office. - Refilled losartan  25 mg prescription with three refills through mail order.  Tobacco use Current tobacco use with reduced smoking frequency. Previously smoked over a pack a day, now less than half a pack. Smoking cessation is ongoing but challenging. - Encouraged continued efforts to reduce smoking and eventually quit.  Follow up 7-10 days or sooner if needed

## 2024-07-28 NOTE — Patient Instructions (Addendum)
 Acute sinusitis with nasal congestion and frontal sinus pain Acute sinusitis with nasal congestion and frontal sinus pain, ongoing for over a week. Symptoms include green nasal discharge, occasional hemoptysis, and frontal sinus pain. Sinus infections occur once or twice a year. - Prescribed doxycycline  twice a day for ten days, ensuring it is taken with food to prevent stomach irritation. - Advised against using nasal spray due to previous adverse reactions.  Acute bronchitis with productive cough Acute bronchitis with productive cough, ongoing for over a week. History of smoking and COPD increases risk for bronchitis. - Prescribed benzonatate  for cough management. - Recommended over-the-counter corcidin HBP for additional cough relief.  Chronic obstructive pulmonary disease (COPD) COPD with current use of Ellipta inhaler. Smoking history contributes to COPD. Recent exacerbation with productive cough and wheezing. - Continue current COPD management with Ellipta inhaler. - Encouraged smoking cessation efforts.  Essential hypertension Managed with losartan  25 mg. Home blood pressure readings are generally within target range, though slightly elevated in the office. - Refilled losartan  25 mg prescription with three refills through mail order.  Tobacco use Current tobacco use with reduced smoking frequency. Previously smoked over a pack a day, now less than half a pack. Smoking cessation is ongoing but challenging. - Encouraged continued efforts to reduce smoking and eventually quit.  Follow up 7-10 days or sooner if needed
